# Patient Record
Sex: Female | Born: 1970 | Race: White | Hispanic: No | Marital: Married | Smoking: Former smoker
Health system: Southern US, Community
[De-identification: ages and names within clinical notes are randomized; demographics above are authoritative.]

## PROBLEM LIST (undated history)

## (undated) DIAGNOSIS — Z923 Personal history of irradiation: Secondary | ICD-10-CM

## (undated) DIAGNOSIS — Z853 Personal history of malignant neoplasm of breast: Secondary | ICD-10-CM

## (undated) DIAGNOSIS — E8881 Metabolic syndrome: Secondary | ICD-10-CM

## (undated) DIAGNOSIS — Z9884 Bariatric surgery status: Secondary | ICD-10-CM

## (undated) DIAGNOSIS — M8580 Other specified disorders of bone density and structure, unspecified site: Secondary | ICD-10-CM

## (undated) DIAGNOSIS — C50919 Malignant neoplasm of unspecified site of unspecified female breast: Secondary | ICD-10-CM

## (undated) DIAGNOSIS — E559 Vitamin D deficiency, unspecified: Secondary | ICD-10-CM

## (undated) DIAGNOSIS — S42031A Displaced fracture of lateral end of right clavicle, initial encounter for closed fracture: Secondary | ICD-10-CM

## (undated) DIAGNOSIS — E282 Polycystic ovarian syndrome: Secondary | ICD-10-CM

## (undated) DIAGNOSIS — I10 Essential (primary) hypertension: Secondary | ICD-10-CM

## (undated) DIAGNOSIS — D649 Anemia, unspecified: Secondary | ICD-10-CM

## (undated) DIAGNOSIS — E7800 Pure hypercholesterolemia, unspecified: Secondary | ICD-10-CM

## (undated) DIAGNOSIS — Z79811 Long term (current) use of aromatase inhibitors: Secondary | ICD-10-CM

## (undated) DIAGNOSIS — N393 Stress incontinence (female) (male): Secondary | ICD-10-CM

## (undated) DIAGNOSIS — E785 Hyperlipidemia, unspecified: Secondary | ICD-10-CM

## (undated) DIAGNOSIS — F341 Dysthymic disorder: Secondary | ICD-10-CM

## (undated) HISTORY — DX: Malignant neoplasm of unspecified site of unspecified female breast: C50.919

## (undated) HISTORY — DX: Essential (primary) hypertension: I10

## (undated) HISTORY — DX: Displaced fracture of lateral end of right clavicle, initial encounter for closed fracture: S42.031A

## (undated) HISTORY — PX: HERNIA REPAIR: SHX51

## (undated) HISTORY — DX: Pure hypercholesterolemia, unspecified: E78.00

## (undated) HISTORY — PX: PORTACATH PLACEMENT: SHX2246

## (undated) HISTORY — DX: Dysthymic disorder: F34.1

## (undated) HISTORY — DX: Anemia, unspecified: D64.9

## (undated) HISTORY — DX: Personal history of malignant neoplasm of breast: Z85.3

## (undated) HISTORY — PX: TOTAL ABDOMINAL HYSTERECTOMY W/ BILATERAL SALPINGOOPHORECTOMY: SHX83

## (undated) HISTORY — PX: TONSILLECTOMY: SUR1361

## (undated) HISTORY — PX: CHOLECYSTECTOMY: SHX55

---
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
## 2013-02-24 DIAGNOSIS — C801 Malignant (primary) neoplasm, unspecified: Secondary | ICD-10-CM

## 2013-02-24 HISTORY — DX: Malignant (primary) neoplasm, unspecified: C80.1

## 2013-10-14 DIAGNOSIS — C50919 Malignant neoplasm of unspecified site of unspecified female breast: Secondary | ICD-10-CM | POA: Insufficient documentation

## 2015-03-09 DIAGNOSIS — S42031A Displaced fracture of lateral end of right clavicle, initial encounter for closed fracture: Secondary | ICD-10-CM | POA: Insufficient documentation

## 2015-03-09 HISTORY — DX: Displaced fracture of lateral end of right clavicle, initial encounter for closed fracture: S42.031A

## 2015-11-02 DIAGNOSIS — C50919 Malignant neoplasm of unspecified site of unspecified female breast: Secondary | ICD-10-CM | POA: Diagnosis not present

## 2015-11-07 DIAGNOSIS — C50919 Malignant neoplasm of unspecified site of unspecified female breast: Secondary | ICD-10-CM | POA: Diagnosis not present

## 2015-12-17 DIAGNOSIS — Z0120 Encounter for dental examination and cleaning without abnormal findings: Secondary | ICD-10-CM | POA: Diagnosis not present

## 2015-12-27 DIAGNOSIS — G43909 Migraine, unspecified, not intractable, without status migrainosus: Secondary | ICD-10-CM | POA: Diagnosis not present

## 2015-12-27 DIAGNOSIS — F329 Major depressive disorder, single episode, unspecified: Secondary | ICD-10-CM | POA: Diagnosis not present

## 2015-12-27 DIAGNOSIS — F419 Anxiety disorder, unspecified: Secondary | ICD-10-CM | POA: Diagnosis not present

## 2015-12-27 DIAGNOSIS — C50912 Malignant neoplasm of unspecified site of left female breast: Secondary | ICD-10-CM | POA: Diagnosis not present

## 2015-12-27 DIAGNOSIS — E46 Unspecified protein-calorie malnutrition: Secondary | ICD-10-CM | POA: Diagnosis not present

## 2015-12-27 DIAGNOSIS — E7800 Pure hypercholesterolemia, unspecified: Secondary | ICD-10-CM | POA: Diagnosis not present

## 2015-12-27 DIAGNOSIS — K449 Diaphragmatic hernia without obstruction or gangrene: Secondary | ICD-10-CM | POA: Diagnosis not present

## 2015-12-27 DIAGNOSIS — I10 Essential (primary) hypertension: Secondary | ICD-10-CM | POA: Diagnosis not present

## 2016-01-07 DIAGNOSIS — Z23 Encounter for immunization: Secondary | ICD-10-CM | POA: Diagnosis not present

## 2016-01-14 DIAGNOSIS — F419 Anxiety disorder, unspecified: Secondary | ICD-10-CM | POA: Diagnosis not present

## 2016-01-14 DIAGNOSIS — E663 Overweight: Secondary | ICD-10-CM | POA: Diagnosis not present

## 2016-01-14 DIAGNOSIS — C50912 Malignant neoplasm of unspecified site of left female breast: Secondary | ICD-10-CM | POA: Diagnosis not present

## 2016-01-14 DIAGNOSIS — E46 Unspecified protein-calorie malnutrition: Secondary | ICD-10-CM | POA: Diagnosis not present

## 2016-01-14 DIAGNOSIS — K449 Diaphragmatic hernia without obstruction or gangrene: Secondary | ICD-10-CM | POA: Diagnosis not present

## 2016-01-14 DIAGNOSIS — Z713 Dietary counseling and surveillance: Secondary | ICD-10-CM | POA: Diagnosis not present

## 2016-01-14 DIAGNOSIS — F329 Major depressive disorder, single episode, unspecified: Secondary | ICD-10-CM | POA: Diagnosis not present

## 2016-01-14 DIAGNOSIS — G43909 Migraine, unspecified, not intractable, without status migrainosus: Secondary | ICD-10-CM | POA: Diagnosis not present

## 2016-01-14 DIAGNOSIS — I10 Essential (primary) hypertension: Secondary | ICD-10-CM | POA: Diagnosis not present

## 2016-01-14 DIAGNOSIS — E669 Obesity, unspecified: Secondary | ICD-10-CM | POA: Diagnosis not present

## 2016-01-14 DIAGNOSIS — E7800 Pure hypercholesterolemia, unspecified: Secondary | ICD-10-CM | POA: Diagnosis not present

## 2016-01-31 DIAGNOSIS — C50919 Malignant neoplasm of unspecified site of unspecified female breast: Secondary | ICD-10-CM | POA: Diagnosis not present

## 2016-02-12 DIAGNOSIS — Z853 Personal history of malignant neoplasm of breast: Secondary | ICD-10-CM | POA: Diagnosis not present

## 2016-02-27 DIAGNOSIS — E559 Vitamin D deficiency, unspecified: Secondary | ICD-10-CM | POA: Diagnosis not present

## 2016-02-27 DIAGNOSIS — E782 Mixed hyperlipidemia: Secondary | ICD-10-CM | POA: Diagnosis not present

## 2016-03-04 DIAGNOSIS — R7989 Other specified abnormal findings of blood chemistry: Secondary | ICD-10-CM | POA: Diagnosis not present

## 2016-04-30 DIAGNOSIS — C50919 Malignant neoplasm of unspecified site of unspecified female breast: Secondary | ICD-10-CM | POA: Diagnosis not present

## 2016-05-13 DIAGNOSIS — E669 Obesity, unspecified: Secondary | ICD-10-CM | POA: Diagnosis not present

## 2016-05-13 DIAGNOSIS — F329 Major depressive disorder, single episode, unspecified: Secondary | ICD-10-CM | POA: Diagnosis not present

## 2016-05-13 DIAGNOSIS — B001 Herpesviral vesicular dermatitis: Secondary | ICD-10-CM | POA: Diagnosis not present

## 2016-06-06 MED FILL — LETROZOLE 2.5 MG TABLET: 2.5 | 90 days supply | Qty: 90 | Fill #0

## 2016-06-13 DIAGNOSIS — C50919 Malignant neoplasm of unspecified site of unspecified female breast: Secondary | ICD-10-CM | POA: Diagnosis not present

## 2016-06-17 DIAGNOSIS — E669 Obesity, unspecified: Secondary | ICD-10-CM | POA: Diagnosis not present

## 2016-06-18 DIAGNOSIS — C50412 Malignant neoplasm of upper-outer quadrant of left female breast: Secondary | ICD-10-CM | POA: Diagnosis not present

## 2016-06-18 DIAGNOSIS — C50919 Malignant neoplasm of unspecified site of unspecified female breast: Secondary | ICD-10-CM | POA: Diagnosis not present

## 2016-06-27 DIAGNOSIS — Z79811 Long term (current) use of aromatase inhibitors: Secondary | ICD-10-CM | POA: Diagnosis not present

## 2016-06-27 DIAGNOSIS — C50919 Malignant neoplasm of unspecified site of unspecified female breast: Secondary | ICD-10-CM | POA: Diagnosis not present

## 2016-07-15 MED FILL — ATORVASTATIN 20 MG TABLET: 20 | 90 days supply | Qty: 90 | Fill #0

## 2016-07-15 MED FILL — AMLODIPINE BESYLATE 5 MG TA: 5 | 90 days supply | Qty: 90 | Fill #0

## 2016-07-17 MED FILL — ESCITALOPRAM 10 MG TABLET: 10 | 90 days supply | Qty: 90 | Fill #0

## 2016-07-22 DIAGNOSIS — F329 Major depressive disorder, single episode, unspecified: Secondary | ICD-10-CM | POA: Diagnosis not present

## 2016-07-22 DIAGNOSIS — E669 Obesity, unspecified: Secondary | ICD-10-CM | POA: Diagnosis not present

## 2016-07-22 DIAGNOSIS — E8881 Metabolic syndrome: Secondary | ICD-10-CM | POA: Diagnosis not present

## 2016-07-30 MED FILL — METFORMIN HCL ER 500 MG TAB: 500 | 90 days supply | Qty: 180 | Fill #0

## 2016-08-01 DIAGNOSIS — E785 Hyperlipidemia, unspecified: Secondary | ICD-10-CM | POA: Diagnosis not present

## 2016-08-01 DIAGNOSIS — I10 Essential (primary) hypertension: Secondary | ICD-10-CM | POA: Diagnosis not present

## 2016-08-08 DIAGNOSIS — C50919 Malignant neoplasm of unspecified site of unspecified female breast: Secondary | ICD-10-CM | POA: Diagnosis not present

## 2016-08-08 DIAGNOSIS — I10 Essential (primary) hypertension: Secondary | ICD-10-CM | POA: Diagnosis not present

## 2016-08-08 DIAGNOSIS — E782 Mixed hyperlipidemia: Secondary | ICD-10-CM | POA: Diagnosis not present

## 2016-09-04 DIAGNOSIS — Z853 Personal history of malignant neoplasm of breast: Secondary | ICD-10-CM | POA: Diagnosis not present

## 2016-09-12 MED FILL — LETROZOLE 2.5 MG TABLET: 2.5 | 90 days supply | Qty: 90 | Fill #1

## 2016-09-16 DIAGNOSIS — E785 Hyperlipidemia, unspecified: Secondary | ICD-10-CM | POA: Diagnosis not present

## 2016-09-16 DIAGNOSIS — F329 Major depressive disorder, single episode, unspecified: Secondary | ICD-10-CM | POA: Diagnosis not present

## 2016-09-16 DIAGNOSIS — E8881 Metabolic syndrome: Secondary | ICD-10-CM | POA: Diagnosis not present

## 2016-09-16 DIAGNOSIS — E559 Vitamin D deficiency, unspecified: Secondary | ICD-10-CM | POA: Diagnosis not present

## 2016-09-26 DIAGNOSIS — C50919 Malignant neoplasm of unspecified site of unspecified female breast: Secondary | ICD-10-CM | POA: Diagnosis not present

## 2016-11-07 DIAGNOSIS — C50412 Malignant neoplasm of upper-outer quadrant of left female breast: Secondary | ICD-10-CM | POA: Diagnosis not present

## 2016-11-07 DIAGNOSIS — C50919 Malignant neoplasm of unspecified site of unspecified female breast: Secondary | ICD-10-CM | POA: Diagnosis not present

## 2016-11-28 MED FILL — AMLODIPINE BESYLATE 5 MG TA: 5 | 90 days supply | Qty: 90 | Fill #1

## 2016-11-28 MED FILL — ESCITALOPRAM 10 MG TABLET: 10 | 90 days supply | Qty: 90 | Fill #1

## 2016-12-17 MED FILL — LETROZOLE 2.5 MG TABLET: 2.5 | 90 days supply | Qty: 90 | Fill #2

## 2016-12-17 MED FILL — ATORVASTATIN 20 MG TABLET: 20 | 90 days supply | Qty: 90 | Fill #1

## 2017-02-06 ENCOUNTER — Ambulatory Visit (INDEPENDENT_AMBULATORY_CARE_PROVIDER_SITE_OTHER): Payer: 59 | Admitting: Family Medicine

## 2017-02-06 ENCOUNTER — Encounter: Payer: Self-pay | Admitting: Family Medicine

## 2017-02-06 DIAGNOSIS — Z853 Personal history of malignant neoplasm of breast: Secondary | ICD-10-CM | POA: Diagnosis not present

## 2017-02-06 DIAGNOSIS — I10 Essential (primary) hypertension: Secondary | ICD-10-CM

## 2017-02-06 DIAGNOSIS — E559 Vitamin D deficiency, unspecified: Secondary | ICD-10-CM

## 2017-02-06 DIAGNOSIS — F5081 Binge eating disorder: Secondary | ICD-10-CM | POA: Diagnosis not present

## 2017-02-06 DIAGNOSIS — E7800 Pure hypercholesterolemia, unspecified: Secondary | ICD-10-CM

## 2017-02-06 DIAGNOSIS — R5383 Other fatigue: Secondary | ICD-10-CM

## 2017-02-06 DIAGNOSIS — Z23 Encounter for immunization: Secondary | ICD-10-CM | POA: Diagnosis not present

## 2017-02-06 DIAGNOSIS — F341 Dysthymic disorder: Secondary | ICD-10-CM | POA: Diagnosis not present

## 2017-02-06 MED ORDER — LISDEXAMFETAMINE DIMESYLATE 20 MG PO CAPS: 20.0000 mg | ORAL_CAPSULE | Freq: Every day | ORAL | 0 refills | Status: DC

## 2017-02-06 NOTE — Progress Notes (Signed)
Alexis Chase is a 46 y.o. female is here to [facility].   Patient Care Team: Briscoe Deutscher, DO as PCP - General (Family Medicine)   History of Present Illness:   HPI: See Assessment and Plan section for Problem Based Charting of issues discussed today.  Within the past 3 months... Rarely Sometimes Often Always  During the past 3 months, have you had any episodes of excessive overeating?   x   Do you feel distressed about your excessive overeating?    x  During your episodes of excessive overeating, how often did you feel like you had no control over your eating?  x    During your episodes of excessive eating, how often did you continue to eat even though you were not hungry?   x   During your episodes of excessive overeating, how often were you embarrassed by how much you ate?   x   During your episodes of excessive overeating, how often did you feel disgusted by yourself or guilty afterwards?   x   During the last 3 months, how often did you make yourself vomit as a means to control your weight or shape? NONE      There are no preventive care reminders to display for this patient.   Depression screen PHQ 2/9 02/06/2017  Decreased Interest 0  Down, Depressed, Hopeless 0  PHQ - 2 Score 0   PMHx, SurgHx, SocialHx, Medications, and Allergies were reviewed in the Visit Navigator and updated as appropriate.   Past Medical History:  Diagnosis Date  . Dysthymia 02/07/2017  . Essential hypertension 02/07/2017  . History of breast cancer 02/07/2017  . Pure hypercholesterolemia 02/07/2017   History reviewed. No pertinent surgical history. History reviewed. No pertinent family history.   Social History   Tobacco Use  . Smoking status: Never Smoker  . Smokeless tobacco: Never Used  Substance Use Topics  . Alcohol use: Not on file  . Drug use: Not on file   Current Medications and Allergies:   .  cholecalciferol (VITAMIN D) 1000 units tablet, Take 1,000 Units by mouth  daily., Disp: , Rfl:  .  amLODipine (NORVASC) 5 MG tablet, Take 1 tablet by mouth daily., Disp: , Rfl: 1 .  atorvastatin (LIPITOR) 20 MG tablet, Take 1 tablet by mouth daily., Disp: , Rfl: 1 .  escitalopram (LEXAPRO) 10 MG tablet, , Disp: , Rfl: 1 .  letrozole (FEMARA) 2.5 MG tablet, Take 1 tablet by mouth daily., Disp: , Rfl: 3  No Known Allergies   Review of Systems:   Pertinent items are noted in the HPI. Otherwise, ROS is negative.  Vitals:   Vitals:   02/06/17 1409  BP: 136/74  Pulse: 63  Temp: 98 F (36.7 C)  TempSrc: Oral  SpO2: 96%  Weight: 242 lb (109.8 kg)  Height: 5\' 7"  (1.702 m)     Body mass index is 37.9 kg/m.   Physical Exam:   Physical Exam  Constitutional: She is oriented to person, place, and time. She appears well-developed and well-nourished. No distress.  HENT:  Head: Normocephalic and atraumatic.  Right Ear: External ear normal.  Left Ear: External ear normal.  Nose: Nose normal.  Mouth/Throat: Oropharynx is clear and moist.  Eyes: Conjunctivae and EOM are normal. Pupils are equal, round, and reactive to light.  Neck: Normal range of motion. Neck supple. No thyromegaly present.  Cardiovascular: Normal rate, regular rhythm, normal heart sounds and intact distal pulses.  Pulmonary/Chest: Effort  normal and breath sounds normal.  Abdominal: Soft. Bowel sounds are normal.  Musculoskeletal: Normal range of motion.  Lymphadenopathy:    She has no cervical adenopathy.  Neurological: She is alert and oriented to person, place, and time.  Skin: Skin is warm and dry. Capillary refill takes less than 2 seconds.  Psychiatric: She has a normal mood and affect. Her behavior is normal.  Nursing note and vitals reviewed.  Results for orders placed or performed in visit on 02/06/17  VITAMIN D 25 Hydroxy (Vit-D Deficiency, Fractures)  Result Value Ref Range   Vit D, 25-Hydroxy 63 30 - 100 ng/mL  Vitamin B12  Result Value Ref Range   Vitamin B-12 456 200 -  1,100 pg/mL  TSH  Result Value Ref Range   TSH 1.56 mIU/L  Lipid panel  Result Value Ref Range   Cholesterol 163 <200 mg/dL   HDL 61 >50 mg/dL   Triglycerides 111 <150 mg/dL   LDL Cholesterol (Calc) 81 mg/dL (calc)   Total CHOL/HDL Ratio 2.7 <5.0 (calc)   Non-HDL Cholesterol (Calc) 102 <130 mg/dL (calc)  Hemoglobin A1c  Result Value Ref Range   Hgb A1c MFr Bld 5.5 <5.7 % of total Hgb   Mean Plasma Glucose 111 (calc)   eAG (mmol/L) 6.2 (calc)  Comprehensive metabolic panel  Result Value Ref Range   Glucose, Bld 87 65 - 99 mg/dL   BUN 16 7 - 25 mg/dL   Creat 0.99 0.50 - 1.10 mg/dL   BUN/Creatinine Ratio NOT APPLICABLE 6 - 22 (calc)   Sodium 141 135 - 146 mmol/L   Potassium 4.3 3.5 - 5.3 mmol/L   Chloride 103 98 - 110 mmol/L   CO2 25 20 - 32 mmol/L   Calcium 10.2 8.6 - 10.2 mg/dL   Total Protein 7.4 6.1 - 8.1 g/dL   Albumin 4.7 3.6 - 5.1 g/dL   Globulin 2.7 1.9 - 3.7 g/dL (calc)   AG Ratio 1.7 1.0 - 2.5 (calc)   Total Bilirubin 0.5 0.2 - 1.2 mg/dL   Alkaline phosphatase (APISO) 125 (H) 33 - 115 U/L   AST 26 10 - 35 U/L   ALT 30 (H) 6 - 29 U/L  CBC with Differential/Platelet  Result Value Ref Range   WBC 6.1 3.8 - 10.8 Thousand/uL   RBC 4.43 3.80 - 5.10 Million/uL   Hemoglobin 13.9 11.7 - 15.5 g/dL   HCT 40.7 35.0 - 45.0 %   MCV 91.9 80.0 - 100.0 fL   MCH 31.4 27.0 - 33.0 pg   MCHC 34.2 32.0 - 36.0 g/dL   RDW 12.3 11.0 - 15.0 %   Platelets 252 140 - 400 Thousand/uL   MPV 9.8 7.5 - 12.5 fL   Neutro Abs 3,331 1,500 - 7,800 cells/uL   Lymphs Abs 2,184 850 - 3,900 cells/uL   WBC mixed population 445 200 - 950 cells/uL   Eosinophils Absolute 98 15 - 500 cells/uL   Basophils Absolute 43 0 - 200 cells/uL   Neutrophils Relative % 54.6 %   Total Lymphocyte 35.8 %   Monocytes Relative 7.3 %   Eosinophils Relative 1.6 %   Basophils Relative 0.7 %   Assessment and Plan:   Problem  Vitamin D Deficiency  Binge Eating Disorder    Within the past 3 months... Rarely  Sometimes Often Always  During the past 3 months, have you had any episodes of excessive overeating?   x   Do you feel distressed about your excessive overeating?  x  During your episodes of excessive overeating, how often did you feel like you had no control over your eating?  x    During your episodes of excessive eating, how often did you continue to eat even though you were not hungry?   x   During your episodes of excessive overeating, how often were you embarrassed by how much you ate?   x   During your episodes of excessive overeating, how often did you feel disgusted by yourself or guilty afterwards?   x   During the last 3 months, how often did you make yourself vomit as a means to control your weight or shape? NONE         Morbid Obesity ([facility])   BMI 37.9 with comorbid conditions.  Wt Readings from Last 3 Encounters:  02/06/17 242 lb (109.8 kg)   Contraindications to weight loss: none. Patient readiness to commit to diet and activity changes: excellent. Barriers to weight loss: time limitations.  Plan: 1. Diagnostic studies to rule out secondary causes of obesity: see below. 2. General patient education:   Average sustained weight loss in long-term studies w/lifestyle interventions alone is 10-15 lb.  Importance of long-term maintenance tx in weight loss.  Use non-food self-rewards to reinforce behavior changes.  Elicit support from others; identify saboteurs.  Practical target weight is usually around 2 BMI units below current weight. 3. Diet interventions:   Risks of dieting were reviewed, including fatigue, temporary hair loss, gallstone formation, gout, and with very low calorie diets, electrolyte abnormalities, nutrient inadequacies, and loss of lean body mass. 4. Exercise intervention:   Informal measures, e.g. taking stairs instead of elevator.  Formal exercise regimen options. 5. Other behavioral treatment: stress management. 6. Other treatment:  Medication: Vyvanse. 7. Patient to keep a weight log that we will review at follow up. 8. Follow up: 3 months and as needed.     Essential Hypertension   Avoiding excessive salt intake? [x]   YES  []   NO Trying to exercise on a regular basis? []   YES  [x]   NO Review: taking medications as instructed, no medication side effects noted, no TIAs, no chest pain on exertion, no dyspnea on exertion, no swelling of ankles.   Wt Readings from Last 3 Encounters:  02/06/17 242 lb (109.8 kg)   Reports that  has never smoked.   BP Readings from Last 3 Encounters:  02/06/17 136/74   Lab Results  Component Value Date   CREATININE 0.99 02/06/2017     Hyperlipidemia   Is the patient taking medications without problems? [x]   YES  []   NO Does the patient complain of muscle aches?   []   YES  [x]    NO Trying to exercise on a regular basis? []   YES  []   NO Diet Compliance: compliant most of the time. Concerns: weight gain. Cardiovascular ROS: no chest pain or dyspnea on exertion.   Lipids:    Component Value Date/Time   CHOL 163 02/06/2017 1522   TRIG 111 02/06/2017 1522   HDL 61 02/06/2017 1522   CHOLHDL 2.7 02/06/2017 1522     Dysthymia    Treatment to date has included Medication.  Symptoms have been stable since onset of treatment.  Side effects from the treatment include: none.   Alcohol use: no.  Drug use: no.  Exercise: minimal.   Patient denies current suicidal and homicidal ideation.      Orders Placed This Encounter  Procedures  . Flu Vaccine  QUAD 36+ mos IM  . VITAMIN D 25 Hydroxy (Vit-D Deficiency, Fractures)  . Vitamin B12  . TSH  . Lipid panel  . Hemoglobin A1c  . Comprehensive metabolic panel  . CBC with Differential/Platelet    Meds ordered this encounter  Medications  . lisdexamfetamine (VYVANSE) 20 MG capsule    Sig: Take 1 capsule (20 mg total) by mouth daily.    Dispense:  30 capsule    Refill:  0    . Reviewed expectations re: course of current  medical issues. . Discussed self-management of symptoms. . Outlined signs and symptoms indicating need for more acute intervention. . Patient verbalized understanding and all questions were answered. Marland Kitchen Health Maintenance issues including appropriate healthy diet, exercise, and smoking avoidance were discussed with patient. . See orders for this visit as documented in the electronic medical record. . Patient received an After Visit Summary.  CMA served as Education administrator during this visit. History, Physical, and Plan performed by medical provider. The above documentation has been reviewed and is accurate and complete. Briscoe Deutscher, D.O.  Briscoe Deutscher, DO [place], [place] 02/08/2017  Future Appointments  Date Time Provider [place]  05/01/2017  3:00 PM Briscoe Deutscher, DO [facility]

## 2017-02-07 ENCOUNTER — Encounter: Payer: Self-pay | Admitting: Family Medicine

## 2017-02-07 DIAGNOSIS — E669 Obesity, unspecified: Secondary | ICD-10-CM | POA: Insufficient documentation

## 2017-02-07 DIAGNOSIS — E7800 Pure hypercholesterolemia, unspecified: Secondary | ICD-10-CM

## 2017-02-07 DIAGNOSIS — Z853 Personal history of malignant neoplasm of breast: Secondary | ICD-10-CM | POA: Insufficient documentation

## 2017-02-07 DIAGNOSIS — E785 Hyperlipidemia, unspecified: Secondary | ICD-10-CM | POA: Insufficient documentation

## 2017-02-07 DIAGNOSIS — F341 Dysthymic disorder: Secondary | ICD-10-CM | POA: Insufficient documentation

## 2017-02-07 DIAGNOSIS — E782 Mixed hyperlipidemia: Secondary | ICD-10-CM | POA: Insufficient documentation

## 2017-02-07 DIAGNOSIS — I10 Essential (primary) hypertension: Secondary | ICD-10-CM | POA: Insufficient documentation

## 2017-02-07 HISTORY — DX: Dysthymic disorder: F34.1

## 2017-02-07 HISTORY — DX: Personal history of malignant neoplasm of breast: Z85.3

## 2017-02-07 HISTORY — DX: Essential (primary) hypertension: I10

## 2017-02-07 HISTORY — DX: Pure hypercholesterolemia, unspecified: E78.00

## 2017-02-07 HISTORY — DX: Hyperlipidemia, unspecified: E78.5

## 2017-02-07 LAB — CBC WITH DIFFERENTIAL/PLATELET
Basophils Absolute: 43 cells/uL (ref 0–200)
Basophils Relative: 0.7 %
Eosinophils Absolute: 98 cells/uL (ref 15–500)
Eosinophils Relative: 1.6 %
HCT: 40.7 % (ref 35.0–45.0)
Hemoglobin: 13.9 g/dL (ref 11.7–15.5)
Lymphs Abs: 2184 cells/uL (ref 850–3900)
MCH: 31.4 pg (ref 27.0–33.0)
MCHC: 34.2 g/dL (ref 32.0–36.0)
MCV: 91.9 fL (ref 80.0–100.0)
MPV: 9.8 fL (ref 7.5–12.5)
Monocytes Relative: 7.3 %
Neutro Abs: 3331 cells/uL (ref 1500–7800)
Neutrophils Relative %: 54.6 %
Platelets: 252 10*3/uL (ref 140–400)
RBC: 4.43 10*6/uL (ref 3.80–5.10)
RDW: 12.3 % (ref 11.0–15.0)
Total Lymphocyte: 35.8 %
WBC mixed population: 445 cells/uL (ref 200–950)
WBC: 6.1 10*3/uL (ref 3.8–10.8)

## 2017-02-07 LAB — LIPID PANEL
Cholesterol: 163 mg/dL (ref <200)
HDL: 61 mg/dL (ref >50)
LDL Cholesterol (Calc): 81 mg/dL (calc)
Non-HDL Cholesterol (Calc): 102 mg/dL (calc) (ref <130)
Total CHOL/HDL Ratio: 2.7 (calc) (ref <5.0)
Triglycerides: 111 mg/dL (ref <150)

## 2017-02-07 LAB — COMPREHENSIVE METABOLIC PANEL
AG Ratio: 1.7 (calc) (ref 1.0–2.5)
ALT: 30 U/L — ABNORMAL HIGH (ref 6–29)
AST: 26 U/L (ref 10–35)
Albumin: 4.7 g/dL (ref 3.6–5.1)
Alkaline phosphatase (APISO): 125 U/L — ABNORMAL HIGH (ref 33–115)
BUN: 16 mg/dL (ref 7–25)
CO2: 25 mmol/L (ref 20–32)
Calcium: 10.2 mg/dL (ref 8.6–10.2)
Chloride: 103 mmol/L (ref 98–110)
Creat: 0.99 mg/dL (ref 0.50–1.10)
Globulin: 2.7 g/dL (calc) (ref 1.9–3.7)
Glucose, Bld: 87 mg/dL (ref 65–99)
Potassium: 4.3 mmol/L (ref 3.5–5.3)
Sodium: 141 mmol/L (ref 135–146)
Total Bilirubin: 0.5 mg/dL (ref 0.2–1.2)
Total Protein: 7.4 g/dL (ref 6.1–8.1)

## 2017-02-07 LAB — VITAMIN B12: Vitamin B-12: 456 pg/mL (ref 200–1100)

## 2017-02-07 LAB — VITAMIN D 25 HYDROXY (VIT D DEFICIENCY, FRACTURES): Vit D, 25-Hydroxy: 63 ng/mL (ref 30–100)

## 2017-02-07 LAB — TSH: TSH: 1.56 mIU/L

## 2017-02-07 LAB — HEMOGLOBIN A1C
Hgb A1c MFr Bld: 5.5 % of total Hgb (ref <5.7)
Mean Plasma Glucose: 111 (calc)
eAG (mmol/L): 6.2 (calc)

## 2017-02-08 DIAGNOSIS — E559 Vitamin D deficiency, unspecified: Secondary | ICD-10-CM | POA: Insufficient documentation

## 2017-02-08 DIAGNOSIS — F5081 Binge eating disorder: Secondary | ICD-10-CM | POA: Insufficient documentation

## 2017-02-08 HISTORY — DX: Vitamin D deficiency, unspecified: E55.9

## 2017-02-09 MED FILL — VYVANSE 20 MG CAPSULE: 20 | 30 days supply | Qty: 30 | Fill #0

## 2017-02-18 DIAGNOSIS — C50919 Malignant neoplasm of unspecified site of unspecified female breast: Secondary | ICD-10-CM | POA: Diagnosis not present

## 2017-02-18 DIAGNOSIS — C50412 Malignant neoplasm of upper-outer quadrant of left female breast: Secondary | ICD-10-CM | POA: Diagnosis not present

## 2017-03-15 ENCOUNTER — Encounter: Payer: Self-pay | Admitting: Family Medicine

## 2017-03-16 ENCOUNTER — Other Ambulatory Visit: Payer: Self-pay | Admitting: Surgical

## 2017-03-16 DIAGNOSIS — F341 Dysthymic disorder: Secondary | ICD-10-CM

## 2017-03-16 DIAGNOSIS — Z853 Personal history of malignant neoplasm of breast: Secondary | ICD-10-CM

## 2017-03-16 DIAGNOSIS — I10 Essential (primary) hypertension: Secondary | ICD-10-CM

## 2017-03-16 MED ORDER — LETROZOLE 2.5 MG PO TABS: 2.5000 mg | ORAL_TABLET | Freq: Every day | ORAL | 3 refills | Status: DC

## 2017-03-16 MED ORDER — AMLODIPINE BESYLATE 5 MG PO TABS: 5.0000 mg | ORAL_TABLET | Freq: Every day | ORAL | 3 refills | Status: DC

## 2017-03-16 MED ORDER — ESCITALOPRAM OXALATE 10 MG PO TABS: 10.0000 mg | ORAL_TABLET | Freq: Every day | ORAL | 3 refills | Status: DC

## 2017-03-16 MED FILL — ESCITALOPRAM 10 MG TABLET: 10 | 30 days supply | Qty: 30 | Fill #0

## 2017-03-16 MED FILL — LETROZOLE 2.5 MG TABLET: 2.5 | 30 days supply | Qty: 30 | Fill #0

## 2017-03-16 MED FILL — AMLODIPINE BESYLATE 5 MG TA: 5 | 90 days supply | Qty: 90 | Fill #0

## 2017-03-30 ENCOUNTER — Encounter: Payer: Self-pay | Admitting: Family Medicine

## 2017-04-02 ENCOUNTER — Other Ambulatory Visit: Payer: Self-pay | Admitting: Family Medicine

## 2017-04-10 MED FILL — LETROZOLE 2.5 MG TABLET: 2.5 | 30 days supply | Qty: 30 | Fill #1

## 2017-04-10 MED FILL — ESCITALOPRAM 10 MG TABLET: 10 | 30 days supply | Qty: 30 | Fill #1

## 2017-05-01 ENCOUNTER — Ambulatory Visit (INDEPENDENT_AMBULATORY_CARE_PROVIDER_SITE_OTHER): Payer: 59 | Admitting: Family Medicine

## 2017-05-01 VITALS — BP 122/84 | HR 75 | Temp 98.1°F | Ht 67.0 in | Wt 246.8 lb

## 2017-05-01 DIAGNOSIS — F341 Dysthymic disorder: Secondary | ICD-10-CM

## 2017-05-01 DIAGNOSIS — Z853 Personal history of malignant neoplasm of breast: Secondary | ICD-10-CM | POA: Diagnosis not present

## 2017-05-01 DIAGNOSIS — I10 Essential (primary) hypertension: Secondary | ICD-10-CM

## 2017-05-01 DIAGNOSIS — E7800 Pure hypercholesterolemia, unspecified: Secondary | ICD-10-CM

## 2017-05-01 DIAGNOSIS — F5081 Binge eating disorder: Secondary | ICD-10-CM

## 2017-05-01 MED ORDER — LISDEXAMFETAMINE DIMESYLATE 20 MG PO CAPS: 20.0000 mg | ORAL_CAPSULE | Freq: Every day | ORAL | 0 refills | Status: DC

## 2017-05-01 MED ORDER — METFORMIN HCL ER 500 MG PO TB24: 500.0000 mg | ORAL_TABLET | Freq: Every day | ORAL | 0 refills | Status: DC

## 2017-05-01 NOTE — Progress Notes (Signed)
Alexis Chase is a 47 y.o. female is here for follow up.  History of Present Illness:   [name] CMA acting as scribe for Dr. Juleen China.  HPI: See Assessment and Plan section for Problem Based Charting of issues discussed today.  Review of Systems  Constitutional: Negative for chills and fever.  HENT: Negative for congestion and ear pain.   Eyes: Negative for blurred vision and double vision.  Respiratory: Negative for cough and wheezing.   Cardiovascular: Negative for chest pain and palpitations.  Gastrointestinal: Negative for nausea and vomiting.  Genitourinary: Negative for dysuria.  Musculoskeletal: Negative for back pain and neck pain.  Neurological: Negative for dizziness and headaches.  Psychiatric/Behavioral: Negative for depression and suicidal ideas.   There are no preventive care reminders to display for this patient. Depression screen PHQ 2/9 02/06/2017  Decreased Interest 0  Down, Depressed, Hopeless 0  PHQ - 2 Score 0   PMHx, SurgHx, SocialHx, FamHx, Medications, and Allergies were reviewed in the Visit Navigator and updated as appropriate.   Patient Active Problem List   Diagnosis Date Noted  . Vitamin D deficiency 02/08/2017  . Binge eating disorder, on Vyvanse 02/08/2017  . Morbid obesity ([place]), interested in gastric sleeve 02/07/2017  . Essential hypertension, on Norvasc 02/07/2017  . Hyperlipidemia, on statin 02/07/2017  . Dysthymia 02/07/2017  . History of breast cancer, ER +, HER2 -, grade 3, s/p chemo and radiation. 3 year anniversery recently 02/07/2017   Social History   Tobacco Use  . Smoking status: Never Smoker  . Smokeless tobacco: Never Used  Substance Use Topics  . Alcohol use: Not on file  . Drug use: Not on file   Current Medications and Allergies:   .  amLODipine (NORVASC) 5 MG tablet, Take 1 tablet (5 mg total) by mouth daily., Disp: 30 tablet, Rfl: 3 .  atorvastatin (LIPITOR) 20 MG tablet, Take 1 tablet by mouth daily.,  Disp: , Rfl: 1 .  cholecalciferol (VITAMIN D) 1000 units tablet, Take 1,000 Units by mouth daily., Disp: , Rfl:  .  escitalopram (LEXAPRO) 10 MG tablet, Take 1 tablet (10 mg total) by mouth daily., Disp: 30 tablet, Rfl: 3 .  letrozole (FEMARA) 2.5 MG tablet, Take 1 tablet (2.5 mg total) by mouth daily., Disp: 30 tablet, Rfl: 3 .  lisdexamfetamine (VYVANSE) 20 MG capsule, Take 1 capsule (20 mg total) by mouth daily., Disp: 30 capsule, Rfl: 0  No Known Allergies   Review of Systems   Pertinent items are noted in the HPI. Otherwise, ROS is negative.  Vitals:   Vitals:   05/01/17 1505  BP: 122/84  Pulse: 75  Temp: 98.1 F (36.7 C)  TempSrc: Oral  SpO2: 97%  Weight: 246 lb 12.8 oz (111.9 kg)  Height: '5\' 7"'$  (1.702 m)     Body mass index is 38.65 kg/m.  Physical Exam:   Physical Exam  Constitutional: She is oriented to person, place, and time. She appears well-developed and well-nourished. No distress.  HENT:  Head: Normocephalic and atraumatic.  Right Ear: External ear normal.  Left Ear: External ear normal.  Nose: Nose normal.  Mouth/Throat: Oropharynx is clear and moist.  Eyes: Conjunctivae and EOM are normal. Pupils are equal, round, and reactive to light.  Neck: Normal range of motion. Neck supple. No thyromegaly present.  Cardiovascular: Normal rate, regular rhythm, normal heart sounds and intact distal pulses.  Pulmonary/Chest: Effort normal and breath sounds normal.  Abdominal: Soft. Bowel sounds are normal.  Musculoskeletal:  Normal range of motion.  Lymphadenopathy:    She has no cervical adenopathy.  Neurological: She is alert and oriented to person, place, and time.  Skin: Skin is warm and dry. Capillary refill takes less than 2 seconds.  Psychiatric: She has a normal mood and affect. Her behavior is normal.  Nursing note and vitals reviewed.   Results for orders placed or performed in visit on 02/06/17  VITAMIN D 25 Hydroxy (Vit-D Deficiency, Fractures)    Result Value Ref Range   Vit D, 25-Hydroxy 63 30 - 100 ng/mL  Vitamin B12  Result Value Ref Range   Vitamin B-12 456 200 - 1,100 pg/mL  TSH  Result Value Ref Range   TSH 1.56 mIU/L  Lipid panel  Result Value Ref Range   Cholesterol 163 <200 mg/dL   HDL 61 >50 mg/dL   Triglycerides 111 <150 mg/dL   LDL Cholesterol (Calc) 81 mg/dL (calc)   Total CHOL/HDL Ratio 2.7 <5.0 (calc)   Non-HDL Cholesterol (Calc) 102 <130 mg/dL (calc)  Hemoglobin A1c  Result Value Ref Range   Hgb A1c MFr Bld 5.5 <5.7 % of total Hgb   Mean Plasma Glucose 111 (calc)   eAG (mmol/L) 6.2 (calc)  Comprehensive metabolic panel  Result Value Ref Range   Glucose, Bld 87 65 - 99 mg/dL   BUN 16 7 - 25 mg/dL   Creat 0.99 0.50 - 1.10 mg/dL   BUN/Creatinine Ratio NOT APPLICABLE 6 - 22 (calc)   Sodium 141 135 - 146 mmol/L   Potassium 4.3 3.5 - 5.3 mmol/L   Chloride 103 98 - 110 mmol/L   CO2 25 20 - 32 mmol/L   Calcium 10.2 8.6 - 10.2 mg/dL   Total Protein 7.4 6.1 - 8.1 g/dL   Albumin 4.7 3.6 - 5.1 g/dL   Globulin 2.7 1.9 - 3.7 g/dL (calc)   AG Ratio 1.7 1.0 - 2.5 (calc)   Total Bilirubin 0.5 0.2 - 1.2 mg/dL   Alkaline phosphatase (APISO) 125 (H) 33 - 115 U/L   AST 26 10 - 35 U/L   ALT 30 (H) 6 - 29 U/L  CBC with Differential/Platelet  Result Value Ref Range   WBC 6.1 3.8 - 10.8 Thousand/uL   RBC 4.43 3.80 - 5.10 Million/uL   Hemoglobin 13.9 11.7 - 15.5 g/dL   HCT 40.7 35.0 - 45.0 %   MCV 91.9 80.0 - 100.0 fL   MCH 31.4 27.0 - 33.0 pg   MCHC 34.2 32.0 - 36.0 g/dL   RDW 12.3 11.0 - 15.0 %   Platelets 252 140 - 400 Thousand/uL   MPV 9.8 7.5 - 12.5 fL   Neutro Abs 3,331 1,500 - 7,800 cells/uL   Lymphs Abs 2,184 850 - 3,900 cells/uL   WBC mixed population 445 200 - 950 cells/uL   Eosinophils Absolute 98 15 - 500 cells/uL   Basophils Absolute 43 0 - 200 cells/uL   Neutrophils Relative % 54.6 %   Total Lymphocyte 35.8 %   Monocytes Relative 7.3 %   Eosinophils Relative 1.6 %   Basophils Relative 0.7 %    Assessment and Plan:   Patient Active Problem List   Diagnosis Date Noted  . Vitamin D deficiency 02/08/2017  . Binge eating disorder, on Vyvanse 02/08/2017  . Morbid obesity ([place]), interested in gastric sleeve 02/07/2017  . Essential hypertension, on Norvasc 02/07/2017  . Hyperlipidemia, on statin 02/07/2017  . Dysthymia 02/07/2017  . History of breast cancer, ER +, HER2 -, grade  3, s/p chemo and radiation. 3 year anniversery recently 02/07/2017   Essential hypertension, on Norvasc Cardiovascular ROS: no chest pain or dyspnea on exertion. Tolerating medication with issues. Blood pressure at goal. Working on limiting salt and trying to exercise 3 times per week.   Binge eating disorder, on Vyvanse Vyvanse does seem to help control binges. She has been out for a few weeks. Interested in counseling and will look into this when she goes through bariatric surgery work-up.   Dysthymia Psychological ROS: negative for - mood swings, obsessive thoughts or suicidal ideation. Improved with Lexapro. High stress job. Breast cancer survivor. Interested in support groups. Will look into breast center support groups.   Hyperlipidemia, on statin Tolerating Lipitor without myalgias.   History of breast cancer, ER +, HER2 -, grade 3, s/p chemo and radiation. 3 year anniversery recently Whole body scans coming up. Patient admits that she is very worried. She is interested in a breast cancer support group. I believe that the breast center has this, along with a LCSW and other programs for wellness - will see if we can refer.  Morbid obesity ([place]), interested in gastric sleeve Visit #2 for weight loss discussion. She has been exercising 3 days per week at the gym. She does struggle with healthy food choices and staying within calorie limits. Weight is up slightly. She is still interested in the gastric sleeve and has the initial information from [place]. She still needs to watch the video prior  to her initial consultation.   We reviewed the importance of lowering her carbohydrate intake to 100-120 g/day or less. She will also work harder to make her exercise class.  Recheck in 3 months.   Briscoe Deutscher

## 2017-05-02 ENCOUNTER — Encounter: Payer: Self-pay | Admitting: Family Medicine

## 2017-05-02 NOTE — Assessment & Plan Note (Signed)
Tolerating Lipitor without myalgias.

## 2017-05-02 NOTE — Assessment & Plan Note (Signed)
Psychological ROS: negative for - mood swings, obsessive thoughts or suicidal ideation. Improved with Lexapro. High stress job. Breast cancer survivor. Interested in support groups. Will look into breast center support groups.

## 2017-05-02 NOTE — Assessment & Plan Note (Signed)
Cardiovascular ROS: no chest pain or dyspnea on exertion. Tolerating medication with issues. Blood pressure at goal. Working on limiting salt and trying to exercise 3 times per week.

## 2017-05-02 NOTE — Assessment & Plan Note (Signed)
Vyvanse does seem to help control binges. She has been out for a few weeks. Interested in counseling and will look into this when she goes through bariatric surgery work-up.

## 2017-05-03 ENCOUNTER — Encounter: Payer: Self-pay | Admitting: Family Medicine

## 2017-05-03 NOTE — Assessment & Plan Note (Signed)
Visit #2 for weight loss discussion. She has been exercising 3 days per week at the gym. She does struggle with healthy food choices and staying within calorie limits. Weight is up slightly. She is still interested in the gastric sleeve and has the initial information from [place]. She still needs to watch the video prior to her initial consultation.   We reviewed the importance of lowering her carbohydrate intake to 100-120 g/day or less. She will also work harder to make her exercise class.  Recheck in 3 months.

## 2017-05-03 NOTE — Assessment & Plan Note (Signed)
Whole body scans coming up. Patient admits that she is very worried. She is interested in a breast cancer support group. I believe that the breast center has this, along with a LCSW and other programs for wellness - will see if we can refer.

## 2017-05-14 ENCOUNTER — Telehealth: Payer: Self-pay | Admitting: Family Medicine

## 2017-05-14 NOTE — Telephone Encounter (Signed)
See note. No further action required.

## 2017-05-14 NOTE — Telephone Encounter (Signed)
I attempted to call the patient and give her Trey Paula' phone number. Her voicemail box is full so I was unable to leave a message.  I just wanted to make sure it was documented so you were aware I have tried to reach her- if she calls back anyone should be able to give her the number. [phone]).

## 2017-05-14 NOTE — Telephone Encounter (Addendum)
°  Relation to pt: self  Call back [phone]  Reason for call:  Patient returned call and the following # mentioned below was relayed, patient voice understanding.

## 2017-05-15 NOTE — Telephone Encounter (Signed)
Noted  

## 2017-05-21 MED FILL — ATORVASTATIN 20 MG TABLET: 20 | 90 days supply | Qty: 90 | Fill #0

## 2017-05-21 MED FILL — LETROZOLE 2.5 MG TABLET: 2.5 | 30 days supply | Qty: 30 | Fill #2

## 2017-05-21 MED FILL — METFORMIN HCL ER 500 MG TAB: 500 | 90 days supply | Qty: 90 | Fill #0

## 2017-05-21 MED FILL — VYVANSE 20 MG CAPSULE: 20 | 30 days supply | Qty: 30 | Fill #0

## 2017-05-29 DIAGNOSIS — H5203 Hypermetropia, bilateral: Secondary | ICD-10-CM | POA: Diagnosis not present

## 2017-06-18 MED FILL — LETROZOLE 2.5 MG TABLET: 2.5 | 30 days supply | Qty: 30 | Fill #3

## 2017-06-18 MED FILL — AMLODIPINE BESYLATE 5 MG TA: 5 | 90 days supply | Qty: 90 | Fill #1

## 2017-06-18 MED FILL — ESCITALOPRAM 10 MG TABLET: 10 | 30 days supply | Qty: 30 | Fill #2

## 2017-06-26 DIAGNOSIS — C50919 Malignant neoplasm of unspecified site of unspecified female breast: Secondary | ICD-10-CM | POA: Diagnosis not present

## 2017-06-29 ENCOUNTER — Encounter: Payer: Self-pay | Admitting: Family Medicine

## 2017-06-29 ENCOUNTER — Ambulatory Visit (INDEPENDENT_AMBULATORY_CARE_PROVIDER_SITE_OTHER): Payer: 59 | Admitting: Family Medicine

## 2017-06-29 VITALS — BP 122/88 | HR 75 | Temp 97.8°F | Ht 67.0 in

## 2017-06-29 DIAGNOSIS — Z23 Encounter for immunization: Secondary | ICD-10-CM | POA: Diagnosis not present

## 2017-06-29 DIAGNOSIS — Z111 Encounter for screening for respiratory tuberculosis: Secondary | ICD-10-CM | POA: Diagnosis not present

## 2017-06-29 NOTE — Progress Notes (Signed)
Alexis Chase is a 47 y.o. female is here for follow up.  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  CHE:NIDPOEU comes in today to get titers for her work. She needs TB skin test, Tdap, and titers today.   Depression screen PHQ 2/9 02/06/2017  Decreased Interest 0  Down, Depressed, Hopeless 0  PHQ - 2 Score 0   PMHx, SurgHx, SocialHx, FamHx, Medications, and Allergies were reviewed in the Visit Navigator and updated as appropriate.   Patient Active Problem List   Diagnosis Date Noted  . Vitamin D deficiency 02/08/2017  . Binge eating disorder, on Vyvanse 02/08/2017  . Morbid obesity ([place]), interested in gastric sleeve 02/07/2017  . Essential hypertension, on Norvasc 02/07/2017  . Hyperlipidemia, on statin 02/07/2017  . Dysthymia 02/07/2017  . History of breast cancer, ER +, HER2 -, grade 3, s/p chemo and radiation. 3 year anniversery recently 02/07/2017   Social History   Tobacco Use  . Smoking status: Never Smoker  . Smokeless tobacco: Never Used  Substance Use Topics  . Alcohol use: Not on file  . Drug use: Not on file   Current Medications and Allergies:   .  amLODipine (NORVASC) 5 MG tablet, Take 1 tablet (5 mg total) by mouth daily., Disp: 30 tablet .  aspirin EC 81 MG tablet, Take by mouth., Disp: , Rfl:  .  atorvastatin (LIPITOR) 20 MG tablet, Take 1 tablet by mouth daily., Disp: , Rfl: 1 .  cholecalciferol (VITAMIN D) 1000 units tablet, Take 1,000 Units by mouth daily., Disp: , Rfl:  .  escitalopram (LEXAPRO) 10 MG tablet, Take 1 tablet (10 mg total) by mouth daily., Disp: 30 tablet .  letrozole (FEMARA) 2.5 MG tablet, Take 1 tablet (2.5 mg total) by mouth daily., Disp: 30 tablet, Rfl: 3 .  lisdexamfetamine (VYVANSE) 20 MG capsule, Take 1 capsule (20 mg total) by mouth daily. .  metFORMIN (GLUCOPHAGE XR) 500 MG 24 hr tablet, Take 1 tablet (500 mg total) by mouth daily with breakfast., Disp: 90 tablet, Rfl: 0  No Known  Allergies   Review of Systems   Pertinent items are noted in the HPI. Otherwise, ROS is negative.  Vitals:   Vitals:   06/29/17 0712  BP: 122/88  Pulse: 75  Temp: 97.8 F (36.6 C)  TempSrc: Oral  SpO2: 98%  Height: '5\' 7"'$  (1.702 m)     Body mass index is 38.65 kg/m.   Physical Exam:    Assessment and Plan:   Alexis Chase was seen today for follow-up.  Diagnoses and all orders for this visit:  Need for prophylactic vaccination against measles, mumps, rubella and varicella -     Measles/Mumps/Rubella Immunity -     Hepatitis B Surface AntiBODY -     Varicella zoster antibody, IgG  Need for prophylactic vaccination against diphtheria-tetanus-pertussis (DTP) -     Tdap vaccine greater than or equal to 7yo IM  Need for prophylactic vaccination against tuberculosis using BCG vaccine -     PPD   . Reviewed expectations re: course of current medical issues. . Discussed self-management of symptoms. . Outlined signs and symptoms indicating need for more acute intervention. . Patient verbalized understanding and all questions were answered. Marland Kitchen Health Maintenance issues including appropriate healthy diet, exercise, and smoking avoidance were discussed with patient. . See orders for this visit as documented in the electronic medical record. . Patient received an After Visit Summary.  Danae Chen  Juleen China DO [place] [place] 06/29/2017  Future Appointments  Date Time Provider [place]  07/01/2017  9:00 AM [facility] NURSE [facility]  08/07/2017  3:00 PM Briscoe Deutscher, DO [facility]

## 2017-07-01 ENCOUNTER — Ambulatory Visit: Payer: 59 | Admitting: *Deleted

## 2017-07-01 DIAGNOSIS — Z111 Encounter for screening for respiratory tuberculosis: Secondary | ICD-10-CM

## 2017-07-01 LAB — TB SKIN TEST
Induration: 0 mm
TB Skin Test: NEGATIVE

## 2017-07-01 LAB — VARICELLA ZOSTER ANTIBODY, IGG: Varicella IgG: 1647 index

## 2017-07-01 LAB — MEASLES/MUMPS/RUBELLA IMMUNITY
Mumps IgG: <9 AU/mL — ABNORMAL LOW
Rubella: 10.7 index
Rubeola IgG: <25 AU/mL — ABNORMAL LOW

## 2017-07-01 LAB — HEPATITIS B SURFACE ANTIBODY,QUALITATIVE: Hep B S Ab: NONREACTIVE

## 2017-07-01 NOTE — Progress Notes (Signed)
Patient arrived for PPD reading. Not all titers have returned. I told her that once they do we will call her if anything additional is needed. 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

## 2017-07-02 ENCOUNTER — Other Ambulatory Visit: Payer: Self-pay

## 2017-07-02 DIAGNOSIS — Z111 Encounter for screening for respiratory tuberculosis: Secondary | ICD-10-CM

## 2017-07-07 ENCOUNTER — Other Ambulatory Visit (INDEPENDENT_AMBULATORY_CARE_PROVIDER_SITE_OTHER): Payer: 59

## 2017-07-07 ENCOUNTER — Ambulatory Visit (INDEPENDENT_AMBULATORY_CARE_PROVIDER_SITE_OTHER): Payer: 59

## 2017-07-07 ENCOUNTER — Encounter: Payer: Self-pay | Admitting: Family Medicine

## 2017-07-07 DIAGNOSIS — Z23 Encounter for immunization: Secondary | ICD-10-CM

## 2017-07-07 DIAGNOSIS — Z111 Encounter for screening for respiratory tuberculosis: Secondary | ICD-10-CM | POA: Diagnosis not present

## 2017-07-09 LAB — QUANTIFERON-TB GOLD PLUS
Mitogen-NIL: >10 IU/mL
NIL: 0.06 IU/mL
QuantiFERON-TB Gold Plus: NEGATIVE
TB1-NIL: <0 IU/mL
TB2-NIL: <0 IU/mL

## 2017-07-16 DIAGNOSIS — E1169 Type 2 diabetes mellitus with other specified complication: Secondary | ICD-10-CM | POA: Diagnosis not present

## 2017-07-16 DIAGNOSIS — E8881 Metabolic syndrome: Secondary | ICD-10-CM | POA: Diagnosis not present

## 2017-07-16 DIAGNOSIS — Z853 Personal history of malignant neoplasm of breast: Secondary | ICD-10-CM | POA: Diagnosis not present

## 2017-07-16 DIAGNOSIS — N393 Stress incontinence (female) (male): Secondary | ICD-10-CM | POA: Diagnosis not present

## 2017-07-16 DIAGNOSIS — E7800 Pure hypercholesterolemia, unspecified: Secondary | ICD-10-CM | POA: Diagnosis not present

## 2017-07-21 ENCOUNTER — Other Ambulatory Visit (HOSPITAL_COMMUNITY): Payer: Self-pay | Admitting: General Surgery

## 2017-07-22 ENCOUNTER — Encounter: Payer: Self-pay | Admitting: Family Medicine

## 2017-07-22 ENCOUNTER — Telehealth: Payer: Self-pay

## 2017-07-22 NOTE — Telephone Encounter (Signed)
SENT REFERRAL TO SCHEDULING 

## 2017-07-27 ENCOUNTER — Other Ambulatory Visit: Payer: Self-pay | Admitting: Family Medicine

## 2017-07-27 ENCOUNTER — Encounter: Payer: Self-pay | Admitting: Family Medicine

## 2017-07-27 DIAGNOSIS — Z853 Personal history of malignant neoplasm of breast: Secondary | ICD-10-CM

## 2017-07-27 MED FILL — LETROZOLE 2.5 MG TABLET: 2.5 | 30 days supply | Qty: 30 | Fill #0

## 2017-07-27 NOTE — Telephone Encounter (Signed)
You gave once in the past ok to refill?

## 2017-08-06 NOTE — Progress Notes (Signed)
Alexis Chase is a 47 y.o. female is here for follow up.  History of Present Illness:   HPI: Excited about Bariatric Surgery in the fall. Meeting with Dietician and Psychologist soon. Needs referral to Cardiology for clearance. Will be having EGD. Still watching diet and exercising regularly.   The 10-year ASCVD risk score Mikey Bussing DC Brooke Bonito., et al., 2013) is: 0.8%   Values used to calculate the score:     Age: 72 years     Sex: Female     Is Non-Hispanic African American: No     Diabetic: No     Tobacco smoker: No     Systolic Blood Pressure: 545 mmHg     Is BP treated: Yes     HDL Cholesterol: 61 mg/dL     Total Cholesterol: 163 mg/dL  There are no preventive care reminders to display for this patient.   Depression screen [facility] 2/9 08/07/2017 02/06/2017  Decreased Interest 0 0  Down, Depressed, Hopeless 0 0  PHQ - 2 Score 0 0  Altered sleeping 0 -  Tired, decreased energy 0 -  Change in appetite 1 -  Feeling bad or failure about yourself  1 -  Trouble concentrating 0 -  Moving slowly or fidgety/restless 0 -  Suicidal thoughts 0 -  PHQ-9 Score 2 -   PMHx, SurgHx, SocialHx, FamHx, Medications, and Allergies were reviewed in the Visit Navigator and updated as appropriate.   Patient Active Problem List   Diagnosis Date Noted  . Vitamin D deficiency 02/08/2017  . Binge eating disorder, on Vyvanse 02/08/2017  . Morbid obesity ([place]), interested in gastric sleeve 02/07/2017  . Essential hypertension, on Norvasc 02/07/2017  . Hyperlipidemia, on statin 02/07/2017  . Dysthymia 02/07/2017  . History of breast cancer, ER +, HER2 -, grade 3, s/p chemo and radiation. 3 year anniversery recently 02/07/2017   Social History   Tobacco Use  . Smoking status: Never Smoker  . Smokeless tobacco: Never Used  Substance Use Topics  . Alcohol use: Not on file  . Drug use: Not on file   Current Medications and Allergies:   Current Outpatient Medications:  .  amLODipine (NORVASC) 5 MG  tablet, Take 1 tablet (5 mg total) by mouth daily., Disp: 30 tablet, Rfl: 3 .  aspirin EC 81 MG tablet, Take by mouth., Disp: , Rfl:  .  atorvastatin (LIPITOR) 20 MG tablet, Take 1 tablet (20 mg total) by mouth daily., Disp: 90 tablet, Rfl: 1 .  cholecalciferol (VITAMIN D) 1000 units tablet, Take 1,000 Units by mouth daily., Disp: , Rfl:  .  escitalopram (LEXAPRO) 10 MG tablet, Take 1 tablet (10 mg total) by mouth daily., Disp: 30 tablet, Rfl: 3 .  letrozole (FEMARA) 2.5 MG tablet, TAKE 1 TABLET BY MOUTH DAILY., Disp: 30 tablet, Rfl: 3 .  metFORMIN (GLUCOPHAGE XR) 500 MG 24 hr tablet, Take 1 tablet (500 mg total) by mouth daily with breakfast., Disp: 90 tablet, Rfl: 0  No Known Allergies Review of Systems   Pertinent items are noted in the HPI. Otherwise, ROS is negative.  Vitals:   Vitals:   08/07/17 1515  BP: 118/86  Pulse: 83  Temp: 98 F (36.7 C)  TempSrc: Oral  SpO2: 98%  Weight: 245 lb 3.2 oz (111.2 kg)  Height: '5\' 7"'$  (1.702 m)     Body mass index is 38.4 kg/m.  Physical Exam:   Physical Exam  Constitutional: She appears well-nourished.  HENT:  Head: Normocephalic and  atraumatic.  Eyes: Pupils are equal, round, and reactive to light. EOM are normal.  Neck: Normal range of motion. Neck supple.  Cardiovascular: Normal rate, regular rhythm, normal heart sounds and intact distal pulses.  Pulmonary/Chest: Effort normal.  Abdominal: Soft.  Skin: Skin is warm.  Psychiatric: She has a normal mood and affect. Her behavior is normal.  Nursing note and vitals reviewed.   Assessment and Plan:   Diagnoses and all orders for this visit:  Essential hypertension -     amLODipine (NORVASC) 5 MG tablet; Take 1 tablet (5 mg total) by mouth daily.  Dysthymia -     escitalopram (LEXAPRO) 10 MG tablet; Take 1 tablet (10 mg total) by mouth daily.  Pure hypercholesterolemia -     atorvastatin (LIPITOR) 20 MG tablet; Take 1 tablet (20 mg total) by mouth daily.  History of  breast cancer, ER +, HER2 -, grade 3, s/p chemo and radiation. 3 year anniversery recently -     Ambulatory referral to Cardiology  Morbid obesity [facility]), interested in gastric sleeve -     Ambulatory referral to Cardiology -     metFORMIN (GLUCOPHAGE XR) 500 MG 24 hr tablet; Take 1 tablet (500 mg total) by mouth daily with breakfast. -     TSH -     Hemoglobin A1c  Fatigue, unspecified type -     TSH -     Hemoglobin A1c    . Reviewed expectations re: course of current medical issues. . Discussed self-management of symptoms. . Outlined signs and symptoms indicating need for more acute intervention. . Patient verbalized understanding and all questions were answered. Marland Kitchen Health Maintenance issues including appropriate healthy diet, exercise, and smoking avoidance were discussed with patient. . See orders for this visit as documented in the electronic medical record. . Patient received an After Visit Summary.  Briscoe Deutscher, DO [place] [place] 08/09/2017  Future Appointments  Date Time Provider [place]  12/29/2017  1:00 PM Yaakov Guthrie, PsyD [facility] None  02/12/2018  2:00 PM Briscoe Deutscher, DO [facility]

## 2017-08-07 ENCOUNTER — Encounter: Payer: Self-pay | Admitting: Family Medicine

## 2017-08-07 ENCOUNTER — Ambulatory Visit (INDEPENDENT_AMBULATORY_CARE_PROVIDER_SITE_OTHER): Payer: 59 | Admitting: Family Medicine

## 2017-08-07 VITALS — BP 118/86 | HR 83 | Temp 98.0°F | Ht 67.0 in | Wt 245.2 lb

## 2017-08-07 DIAGNOSIS — E7800 Pure hypercholesterolemia, unspecified: Secondary | ICD-10-CM

## 2017-08-07 DIAGNOSIS — R5383 Other fatigue: Secondary | ICD-10-CM | POA: Diagnosis not present

## 2017-08-07 DIAGNOSIS — F5081 Binge eating disorder: Secondary | ICD-10-CM | POA: Diagnosis not present

## 2017-08-07 DIAGNOSIS — Z853 Personal history of malignant neoplasm of breast: Secondary | ICD-10-CM | POA: Diagnosis not present

## 2017-08-07 DIAGNOSIS — I10 Essential (primary) hypertension: Secondary | ICD-10-CM

## 2017-08-07 DIAGNOSIS — F341 Dysthymic disorder: Secondary | ICD-10-CM | POA: Diagnosis not present

## 2017-08-07 MED ORDER — ATORVASTATIN CALCIUM 20 MG PO TABS: 20.0000 mg | ORAL_TABLET | Freq: Every day | ORAL | 1 refills | Status: DC

## 2017-08-07 MED ORDER — ESCITALOPRAM OXALATE 10 MG PO TABS: 10.0000 mg | ORAL_TABLET | Freq: Every day | ORAL | 3 refills | Status: DC

## 2017-08-07 MED ORDER — METFORMIN HCL ER 500 MG PO TB24: 500.0000 mg | ORAL_TABLET | Freq: Every day | ORAL | 0 refills | Status: DC

## 2017-08-07 MED ORDER — AMLODIPINE BESYLATE 5 MG PO TABS: 5.0000 mg | ORAL_TABLET | Freq: Every day | ORAL | 3 refills | Status: DC

## 2017-08-08 LAB — HEMOGLOBIN A1C
Hgb A1c MFr Bld: 5.4 % of total Hgb (ref <5.7)
Mean Plasma Glucose: 108 (calc)
eAG (mmol/L): 6 (calc)

## 2017-08-08 LAB — TSH: TSH: 1.55 mIU/L

## 2017-08-09 ENCOUNTER — Encounter: Payer: Self-pay | Admitting: Family Medicine

## 2017-08-24 MED FILL — ESCITALOPRAM 10 MG TABLET: 10 | 30 days supply | Qty: 30 | Fill #3

## 2017-08-24 MED FILL — LETROZOLE 2.5 MG TABLET: 2.5 | 30 days supply | Qty: 30 | Fill #1

## 2017-08-24 MED FILL — ATORVASTATIN CALCIUM 20 MG: 20 | 90 days supply | Qty: 90 | Fill #1

## 2017-09-07 ENCOUNTER — Encounter: Payer: Self-pay | Admitting: Skilled Nursing Facility1

## 2017-09-07 ENCOUNTER — Encounter: Payer: 59 | Attending: General Surgery | Admitting: Skilled Nursing Facility1

## 2017-09-07 DIAGNOSIS — Z818 Family history of other mental and behavioral disorders: Secondary | ICD-10-CM | POA: Diagnosis not present

## 2017-09-07 DIAGNOSIS — E7800 Pure hypercholesterolemia, unspecified: Secondary | ICD-10-CM | POA: Diagnosis not present

## 2017-09-07 DIAGNOSIS — Z8249 Family history of ischemic heart disease and other diseases of the circulatory system: Secondary | ICD-10-CM | POA: Diagnosis not present

## 2017-09-07 DIAGNOSIS — Z79899 Other long term (current) drug therapy: Secondary | ICD-10-CM | POA: Insufficient documentation

## 2017-09-07 DIAGNOSIS — Z853 Personal history of malignant neoplasm of breast: Secondary | ICD-10-CM | POA: Insufficient documentation

## 2017-09-07 DIAGNOSIS — Z836 Family history of other diseases of the respiratory system: Secondary | ICD-10-CM | POA: Insufficient documentation

## 2017-09-07 DIAGNOSIS — Z713 Dietary counseling and surveillance: Secondary | ICD-10-CM | POA: Insufficient documentation

## 2017-09-07 DIAGNOSIS — Z6841 Body Mass Index (BMI) 40.0 and over, adult: Secondary | ICD-10-CM | POA: Insufficient documentation

## 2017-09-07 DIAGNOSIS — F329 Major depressive disorder, single episode, unspecified: Secondary | ICD-10-CM | POA: Diagnosis not present

## 2017-09-07 DIAGNOSIS — Z7984 Long term (current) use of oral hypoglycemic drugs: Secondary | ICD-10-CM | POA: Insufficient documentation

## 2017-09-07 DIAGNOSIS — Z90722 Acquired absence of ovaries, bilateral: Secondary | ICD-10-CM | POA: Insufficient documentation

## 2017-09-07 DIAGNOSIS — Z8371 Family history of colonic polyps: Secondary | ICD-10-CM | POA: Diagnosis not present

## 2017-09-07 DIAGNOSIS — Z90710 Acquired absence of both cervix and uterus: Secondary | ICD-10-CM | POA: Insufficient documentation

## 2017-09-07 DIAGNOSIS — Z87891 Personal history of nicotine dependence: Secondary | ICD-10-CM | POA: Insufficient documentation

## 2017-09-07 NOTE — Progress Notes (Signed)
Pre-Op Assessment Visit:  Pre-Operative sleeve Surgery  Medical Nutrition Therapy:  Appt start time: 4:55  End time:  5:58  Patient was seen on 09/07/2017 for Pre-Operative Nutrition Assessment. Assessment and letter of approval faxed to [facility] Surgery Bariatric Surgery Program coordinator on 09/07/2017.   Pt is in remission from breast cancer.   Pt expectation of surgery: to keep breast cancer from reoccurring   Pt expectation of Dietitian: none  Start weight at [facility]: 248.2 BMI: 41.27  24 hr Dietary Recall: First Meal: 2 cups of coffee with almond milk or protein shake or avocado toast or toast with almond butter  Snack: edameme  Second Meal: salad sans chicken with fruit  Snack: nuts Third Meal: salad with whole wheat taco bowl and beans with lettuce and cheese and salsa and chips Snack:  Beverages: unsweet tea, deit soda, water, coffee  Encouraged to engage in 150 minutes of moderate physical activity including cardiovascular and weight baring weekly  Handouts given during visit include:  . Pre-Op Goals . Bariatric Surgery Protein Shakes During the appointment today the following Pre-Op Goals were reviewed with the patient: . Maintain or lose weight as instructed by your surgeon . Make healthy food choices . Begin to limit portion sizes . Limited concentrated sugars and fried foods . Keep fat/sugar in the single digits per serving on             food labels . Practice CHEWING your food  (aim for 30 chews per bite or until applesauce consistency) . Practice not drinking 15 minutes before, during, and 30 minutes after each meal/snack . Avoid all carbonated beverages  . Avoid/limit caffeinated beverages  . Avoid all sugar-sweetened beverages . Consume 3 meals per day; eat every 3-5 hours . Make a list of non-food related activities . Aim for 64-100 ounces of FLUID daily  . Aim for at least 60-80 grams of PROTEIN daily . Look for a liquid protein source that  contain ?15 g protein and ?5 g carbohydrate  (ex: shakes, drinks, shots)  -Follow diet recommendations listed below   Energy and Macronutrient Recomendations: Calories: 1500 Carbohydrate: 170 Protein: 112 Fat: 42  Demonstrated degree of understanding via:  Teach Back  Teaching Method Utilized:  Visual Auditory Hands on  Barriers to learning/adherence to lifestyle change: none identified   Patient to call the [facility] to enroll in Pre-Op and Post-Op Nutrition Education when surgery date is scheduled.

## 2017-09-11 ENCOUNTER — Ambulatory Visit (HOSPITAL_COMMUNITY)
Admission: RE | Admit: 2017-09-11 | Discharge: 2017-09-11 | Disposition: A | Discharge status: Home / Self Care | Payer: 59 | Source: Ambulatory Visit | Attending: General Surgery | Admitting: General Surgery

## 2017-09-11 DIAGNOSIS — Z01818 Encounter for other preprocedural examination: Secondary | ICD-10-CM | POA: Diagnosis not present

## 2017-09-14 ENCOUNTER — Encounter: Payer: 59 | Admitting: Skilled Nursing Facility1

## 2017-09-14 DIAGNOSIS — Z713 Dietary counseling and surveillance: Secondary | ICD-10-CM | POA: Diagnosis not present

## 2017-09-14 DIAGNOSIS — F329 Major depressive disorder, single episode, unspecified: Secondary | ICD-10-CM | POA: Diagnosis not present

## 2017-09-14 DIAGNOSIS — Z7984 Long term (current) use of oral hypoglycemic drugs: Secondary | ICD-10-CM | POA: Diagnosis not present

## 2017-09-14 DIAGNOSIS — Z6841 Body Mass Index (BMI) 40.0 and over, adult: Secondary | ICD-10-CM | POA: Diagnosis not present

## 2017-09-14 DIAGNOSIS — Z87891 Personal history of nicotine dependence: Secondary | ICD-10-CM | POA: Diagnosis not present

## 2017-09-14 DIAGNOSIS — E7800 Pure hypercholesterolemia, unspecified: Secondary | ICD-10-CM | POA: Diagnosis not present

## 2017-09-14 DIAGNOSIS — Z90722 Acquired absence of ovaries, bilateral: Secondary | ICD-10-CM | POA: Diagnosis not present

## 2017-09-14 DIAGNOSIS — Z79899 Other long term (current) drug therapy: Secondary | ICD-10-CM | POA: Diagnosis not present

## 2017-09-15 ENCOUNTER — Ambulatory Visit (INDEPENDENT_AMBULATORY_CARE_PROVIDER_SITE_OTHER): Payer: 59 | Admitting: Psychiatry

## 2017-09-15 ENCOUNTER — Encounter: Payer: Self-pay | Admitting: Skilled Nursing Facility1

## 2017-09-15 DIAGNOSIS — F509 Eating disorder, unspecified: Secondary | ICD-10-CM | POA: Diagnosis not present

## 2017-09-15 NOTE — Progress Notes (Signed)
Pre-Operative Nutrition Class:  Appt start time: 3403   End time:  1830.  Patient was seen on 09/14/2017 for Pre-Operative Bariatric Surgery Education at the [facility].   Surgery date: Surgery type: sleeve Start weight at [facility]: 248.2 Weight today: 246.3  Samples given per MNT protocol. Patient educated on appropriate usage: Bariatric Advantage Multivitamin Lot # [id number]  Exp: 10/20  Bariatric Advantage Calcium  Lot # [id number] Exp: jan-21-2020  Premier Protein  Shake Lot # [id number] Exp: June 02, 2018   The following the learning objectives were met by the patient during this course:  Identify Pre-Op Dietary Goals and will begin 2 weeks pre-operatively  Identify appropriate sources of fluids and proteins   State protein recommendations and appropriate sources pre and post-operatively  Identify Post-Operative Dietary Goals and will follow for 2 weeks post-operatively  Identify appropriate multivitamin and calcium sources  Describe the need for physical activity post-operatively and will follow MD recommendations  State when to call healthcare provider regarding medication questions or post-operative complications  Handouts given during class include:  Pre-Op Bariatric Surgery Diet Handout  Protein Shake Handout  Post-Op Bariatric Surgery Nutrition Handout  [facility] Program Information Flyer  Support Group Information Flyer  [facility] Bariatric Supplements Price List  Follow-Up Plan: Patient will follow-up at [facility] 2 weeks post operatively for diet advancement per MD.

## 2017-09-18 ENCOUNTER — Ambulatory Visit: Payer: 59 | Admitting: Cardiology

## 2017-09-18 ENCOUNTER — Encounter: Payer: Self-pay | Admitting: Cardiology

## 2017-09-18 VITALS — BP 120/82 | HR 75 | Ht 67.0 in | Wt 247.0 lb

## 2017-09-18 DIAGNOSIS — Z01810 Encounter for preprocedural cardiovascular examination: Secondary | ICD-10-CM | POA: Diagnosis not present

## 2017-09-18 DIAGNOSIS — Z9221 Personal history of antineoplastic chemotherapy: Secondary | ICD-10-CM | POA: Diagnosis not present

## 2017-09-18 NOTE — Progress Notes (Signed)
Cardiology Office Note:    Date:  09/18/2017   ID:  Alexis Chase, DOB May 12, 1970, MRN [id number]  PCP:  Briscoe Deutscher, DO  Cardiologist:  Buford Dresser, MD PhD  Referring MD: Briscoe Deutscher, DO   Chief Complaint  Patient presents with  . Follow-up    NP.     History of Present Illness:    Alexis Chase is a 47 y.o. female with a hx of breast cancer in remission, obesity, hypertension who is seen as a new consult at the request of Dr. Willette Pa. Red[place] Chase for cardiac preoperative evaluation in anticipation of bariatric surgery. She has a past medical history of chemotherapy with adriamycin, HTN, and HLD, as well as a remote history of tobacco use, as risk factors.  Breast cancer history: in 2015, L breast cancer treated with neoadjuvant chemotherapy followed by lumpectomy and radiation, then chemotherapy. Follows up in [place], [place] with oncology.   She is doing well from a cardiac perspective. No chest pain or shortness of breath. Able to be active in her daily life without limitations. Well educated in the indications of bariatric surgery, notes that she was ready to undergo this procedure several years ago but had life events that made it not the ideal time. Has been very motivated to try and lose weight with diet and exercise, but has been unable to attain her goal with this alone. Motivated to lose weight to minimize the chance of a breast cancer recurrence.  Has never had issues with anesthesia in the past.  Past Medical History:  Diagnosis Date  . Anemia   . Breast cancer ([place]) 10/14/2013  . Closed displaced fracture of acromial end of right clavicle 03/09/2015  . Dysthymia 02/07/2017  . Essential hypertension 02/07/2017  . History of breast cancer 02/07/2017   Left Breast   . Pure hypercholesterolemia 02/07/2017    Past Surgical History:  Procedure Laterality Date  . CESAREAN SECTION    . CHOLECYSTECTOMY    . HERNIA REPAIR    . PORTACATH PLACEMENT    .  TONSILLECTOMY    . TOTAL ABDOMINAL HYSTERECTOMY W/ BILATERAL SALPINGOOPHORECTOMY      Current Medications: Current Outpatient Medications on File Prior to Visit  Medication Sig  . amLODipine (NORVASC) 5 MG tablet Take 1 tablet (5 mg total) by mouth daily.  Marland Kitchen aspirin EC 81 MG tablet Take by mouth.  Marland Kitchen atorvastatin (LIPITOR) 20 MG tablet Take 1 tablet (20 mg total) by mouth daily.  . Cholecalciferol (VITAMIN D PO) Take 5,000 Units by mouth daily.  Marland Kitchen escitalopram (LEXAPRO) 10 MG tablet Take 1 tablet (10 mg total) by mouth daily.  Marland Kitchen letrozole (FEMARA) 2.5 MG tablet TAKE 1 TABLET BY MOUTH DAILY.  . metFORMIN (GLUCOPHAGE XR) 500 MG 24 hr tablet Take 1 tablet (500 mg total) by mouth daily with breakfast.  . Multiple Vitamin (MULTIVITAMIN) tablet Take 1 tablet by mouth 2 (two) times daily.  . Probiotic Product (PROBIOTIC DAILY PO) Take 1 tablet by mouth daily.  . Turmeric (CURCUMIN 95 PO) Take 1 tablet by mouth 2 (two) times daily.  Marland Kitchen Ubiquinol (UBQH PO) Take 1 tablet by mouth 2 (two) times daily.   No current facility-administered medications on file prior to visit.      Allergies:   Patient has no known allergies.   Social History   Socioeconomic History  . Marital status: Married    Spouse name: Not on file  . Number of children: Not on file  . Years  of education: Not on file  . Highest education level: Not on file  Occupational History  . Not on file  Social Needs  . Financial resource strain: Not on file  . Food insecurity:    Worry: Not on file    Inability: Not on file  . Transportation needs:    Medical: Not on file    Non-medical: Not on file  Tobacco Use  . Smoking status: Never Smoker  . Smokeless tobacco: Never Used  Substance and Sexual Activity  . Alcohol use: Not on file  . Drug use: Not on file  . Sexual activity: Not on file  Lifestyle  . Physical activity:    Days per week: Not on file    Minutes per session: Not on file  . Stress: Not on file    Relationships  . Social connections:    Talks on phone: Not on file    Gets together: Not on file    Attends religious service: Not on file    Active member of club or organization: Not on file    Attends meetings of clubs or organizations: Not on file    Relationship status: Not on file  Other Topics Concern  . Not on file  Social History Narrative  . Not on file   husband is OB/Gyn  Family History: The patient's family history includes Alcohol abuse in her maternal grandmother; Arthritis in her maternal grandmother; COPD in her father and maternal grandmother; Diabetes in her father and paternal grandmother; Heart disease in her father and paternal grandmother; High Cholesterol in her mother; High blood pressure in her father; Hypertension in her paternal grandmother.  ROS:   Please see the history of present illness.  Additional pertinent ROS: Review of Systems  Constitutional: Negative for chills, fever and malaise/fatigue.  HENT: Negative for ear pain and hearing loss.   Eyes: Negative for blurred vision and pain.  Respiratory: Negative for sputum production and shortness of breath.   Cardiovascular: Negative for chest pain, palpitations, orthopnea, claudication, leg swelling and PND.  Gastrointestinal: Negative for blood in stool, melena, nausea and vomiting.  Genitourinary: Negative for dysuria and hematuria.  Musculoskeletal: Negative for falls and myalgias.  Skin: Negative for itching and rash.  Neurological: Negative for sensory change, focal weakness and loss of consciousness.  Endo/Heme/Allergies: Does not bruise/bleed easily.   Positive for snoring, has never had a sleep study.  EKGs/Labs/Other Studies Reviewed:    The following studies were reviewed today: Care Everywhere echo from 08/03/2014  Final Impressions: 1. There are no significant chamber enlargements noted. 2. Global left ventricular wall motion and contractility are within normal limits. 3. The  estimated ejection fraction is (+/-) 65.74% by 2D.  4. Normal left ventricular diastolic filling is observed. 5. The right ventricular systolic function is within normal limits. 6. All four cardiac valves appear normal in structure and function. 7. The inferior vena cava appearance suggests normal right ventricle filling pressures. 8. There is no pericardial effusion.  Echo limited 08/03/2015 Final Impressions: 1. This is a limited echo to reassess EF; patient has history of chemotherapy. 2. The left ventricular chamber size is normal. 3. Global left ventricular wall motion and contractility are within normal limits. The estimated ejection fraction is 60-65%. 4. There is no pericardial effusion. 5. The inferior vena cava appears normal in size; and there is a greater than 50% respiratory change in the inferior vena cava dimension. 6. There is a greater than 50% respiratory change in  the inferior vena cava dimension.  EKG:  EKG is ordered today.  The ekg ordered today demonstrates normal sinus rhythm.  Recent Labs: 02/06/2017: ALT 30; BUN 16; Creat 0.99; Hemoglobin 13.9; Platelets 252; Potassium 4.3; Sodium 141 08/07/2017: TSH 1.55  Recent Lipid Panel    Component Value Date/Time   CHOL 163 02/06/2017 1522   TRIG 111 02/06/2017 1522   HDL 61 02/06/2017 1522   CHOLHDL 2.7 02/06/2017 1522   LDLCALC 81 02/06/2017 1522    Physical Exam:    VS:  BP 120/82 (BP Location: Left Arm, Patient Position: Sitting, Cuff Size: Large)   Pulse 75   Ht 5\' 7"  (1.702 m)   Wt 247 lb (112 kg)   BMI 38.69 kg/m     Wt Readings from Last 3 Encounters:  09/18/17 247 lb (112 kg)  09/15/17 246 lb 4.8 oz (111.7 kg)  09/07/17 248 lb (112.5 kg)     GEN: Well nourished, well developed in no acute distress HEENT: Normal NECK: No JVD; No carotid bruits LYMPHATICS: No lymphadenopathy CARDIAC: regular rhythm, normal S1 and S2, no murmurs, rubs, gallops. Radial and DP pulses 2+  bilaterally. RESPIRATORY:  Clear to auscultation without rales, wheezing or rhonchi  ABDOMEN: Soft, non-tender, non-distended MUSCULOSKELETAL:  No edema; No deformity  SKIN: Warm and dry NEUROLOGIC:  Alert and oriented x 3 PSYCHIATRIC:  Normal affect   ASSESSMENT:    1. Personal history of antineoplastic chemotherapy   2. Preop cardiovascular exam    PLAN:    1. History of breast cancer, with radiation and adriamycin: Discussed mechanism of heart injury with anthracycline and radiation. Reviewed symptoms of angina, heart failure, and constriction and how each of those can be due to prior treatment. On review of her echoes, it does not appear that she has ever had a global longitudinal strain assessment. She is stable for surgery (see below), but a GLS and overall function will allow [place] to establish a baseline for her.  -echo with strain measurements.  2. Preoperative cardiovascular exam RCRI=0, so low risk for surgery. According to ACC/AHA guidelines, no additional testing is recommended. We are available perioperatively if there are additional questions.   Plan for follow up: 6 mos to monitor post surgery, then annual  Medication Adjustments/Labs and Tests Ordered: Current medicines are reviewed at length with the patient today.  Concerns regarding medicines are outlined above.  Orders Placed This Encounter  Procedures  . EKG 12-Lead  . ECHOCARDIOGRAM COMPLETE   No orders of the defined types were placed in this encounter.   Patient Instructions  Medication Instructions: Your physician recommends that you continue on your current medications as directed.    If you need a refill on your cardiac medications before your next appointment, please call your pharmacy.   Labwork: None  Procedures/Testing: Your physician has requested that you have an echocardiogram. Echocardiography is a painless test that uses sound waves to create images of your heart. It provides your doctor  with information about the size and shape of your heart and how well your heart's chambers and valves are working. This procedure takes approximately one hour. There are no restrictions for this procedure. [place] [address]   Follow-Up: Your physician wants you to follow-up in 6 months with Dr. Harrell Gave. You will receive a reminder letter in the mail two months in advance. If you don't receive a letter, please call our office at [phone] to schedule this follow-up appointment.   Special Instructions:  Thank you for choosing [facility] at [facility]!!       Signed, Buford Dresser, MD PhD 09/18/2017 5:30 PM    [place]

## 2017-09-18 NOTE — Patient Instructions (Signed)
Medication Instructions: Your physician recommends that you continue on your current medications as directed.    If you need a refill on your cardiac medications before your next appointment, please call your pharmacy.   Labwork: None  Procedures/Testing: Your physician has requested that you have an echocardiogram. Echocardiography is a painless test that uses sound waves to create images of your heart. It provides your doctor with information about the size and shape of your heart and how well your heart's chambers and valves are working. This procedure takes approximately one hour. There are no restrictions for this procedure. [place] [address]   Follow-Up: Your physician wants you to follow-up in 6 months with Dr. Harrell Gave. You will receive a reminder letter in the mail two months in advance. If you don't receive a letter, please call our office at [phone] to schedule this follow-up appointment.   Special Instructions:    Thank you for choosing [facility] at [facility]!!

## 2017-10-07 ENCOUNTER — Encounter: Payer: Self-pay | Admitting: Skilled Nursing Facility1

## 2017-10-07 ENCOUNTER — Encounter: Payer: 59 | Attending: General Surgery | Admitting: Skilled Nursing Facility1

## 2017-10-07 DIAGNOSIS — Z90722 Acquired absence of ovaries, bilateral: Secondary | ICD-10-CM | POA: Insufficient documentation

## 2017-10-07 DIAGNOSIS — Z87891 Personal history of nicotine dependence: Secondary | ICD-10-CM | POA: Insufficient documentation

## 2017-10-07 DIAGNOSIS — Z90710 Acquired absence of both cervix and uterus: Secondary | ICD-10-CM | POA: Insufficient documentation

## 2017-10-07 DIAGNOSIS — Z79899 Other long term (current) drug therapy: Secondary | ICD-10-CM | POA: Insufficient documentation

## 2017-10-07 DIAGNOSIS — E639 Nutritional deficiency, unspecified: Secondary | ICD-10-CM

## 2017-10-07 DIAGNOSIS — Z818 Family history of other mental and behavioral disorders: Secondary | ICD-10-CM | POA: Insufficient documentation

## 2017-10-07 DIAGNOSIS — E7800 Pure hypercholesterolemia, unspecified: Secondary | ICD-10-CM | POA: Insufficient documentation

## 2017-10-07 DIAGNOSIS — Z853 Personal history of malignant neoplasm of breast: Secondary | ICD-10-CM | POA: Insufficient documentation

## 2017-10-07 DIAGNOSIS — F329 Major depressive disorder, single episode, unspecified: Secondary | ICD-10-CM | POA: Insufficient documentation

## 2017-10-07 DIAGNOSIS — Z713 Dietary counseling and surveillance: Secondary | ICD-10-CM | POA: Insufficient documentation

## 2017-10-07 DIAGNOSIS — Z8249 Family history of ischemic heart disease and other diseases of the circulatory system: Secondary | ICD-10-CM | POA: Insufficient documentation

## 2017-10-07 DIAGNOSIS — Z836 Family history of other diseases of the respiratory system: Secondary | ICD-10-CM | POA: Insufficient documentation

## 2017-10-07 DIAGNOSIS — Z8371 Family history of colonic polyps: Secondary | ICD-10-CM | POA: Insufficient documentation

## 2017-10-07 DIAGNOSIS — Z6841 Body Mass Index (BMI) 40.0 and over, adult: Secondary | ICD-10-CM | POA: Insufficient documentation

## 2017-10-07 DIAGNOSIS — Z7984 Long term (current) use of oral hypoglycemic drugs: Secondary | ICD-10-CM | POA: Insufficient documentation

## 2017-10-07 NOTE — Progress Notes (Signed)
  Assessment:  Primary concerns today: cone insurance.  Pt arrives for her 1st cone insurance premium visit.   Pt had multiple specific questions about her upcoming surgery. Pt state she will talk with her surgeon about all of her extra supplements.  Pt has had a hx of breast cancer.   Pt arrives having maintained weight.   Start weight at [facility]: 248.2 Weight: 249.8 BMI: 41.27  MEDICATIONS: See List   DIETARY INTAKE:  Usual eating pattern includes 3 meals and 2 snacks per day.  Everyday foods include none stated.  Avoided foods include vegetarian.    24 hr Dietary Recall: First Meal: 2 cups of coffee with almond milk or protein shake or avocado toast or toast with almond butter  Snack: edameme  Second Meal: salad sans chicken with fruit  Snack: nuts Third Meal: salad with whole wheat taco bowl and beans with lettuce and cheese and salsa and chips Snack:  Beverages: unsweet tea, deit soda, water, coffee  Encouraged to engage in 150 minutes of moderate physical activity including cardiovascular and weight baring weekly  Usual physical activity: ADL's  Estimated energy needs: 1500 calories 170 g carbohydrates 112 g protein 42 g fat  Progress Towards Goal(s):  In progress.   Nutritional Diagnosis:  [place]-3.3 Overweight/obesity related to past poor dietary habits and physical inactivity as evidenced by patient w/ recent sleeve surgery following dietary guidelines for continued weight loss.  Intervention:  Nutrition counseling. -Work on getting a plant based protein shake you like  -Work on being active for 150 minutes a week  Teaching Method Utilized:  [employer] Hands on  Barriers to learning/adherence to lifestyle change: none identified   Demonstrated degree of understanding via:  Teach Back   Monitoring/Evaluation:  Dietary intake, exercise,and body weight prn.

## 2017-10-08 MED FILL — ESCITALOPRAM 10 MG TABLET: 10 | 30 days supply | Qty: 30 | Fill #0

## 2017-10-08 MED FILL — LETROZOLE 2.5 MG TABLET: 2.5 | 30 days supply | Qty: 30 | Fill #2

## 2017-10-08 MED FILL — AMLODIPINE BESYLATE 5 MG TA: 5 | 30 days supply | Qty: 30 | Fill #0

## 2017-10-09 ENCOUNTER — Other Ambulatory Visit: Payer: Self-pay

## 2017-10-09 ENCOUNTER — Ambulatory Visit (HOSPITAL_COMMUNITY): Payer: 59 | Attending: Cardiology

## 2017-10-09 DIAGNOSIS — I10 Essential (primary) hypertension: Secondary | ICD-10-CM | POA: Insufficient documentation

## 2017-10-09 DIAGNOSIS — E785 Hyperlipidemia, unspecified: Secondary | ICD-10-CM | POA: Insufficient documentation

## 2017-10-09 DIAGNOSIS — Z01810 Encounter for preprocedural cardiovascular examination: Secondary | ICD-10-CM | POA: Insufficient documentation

## 2017-10-09 DIAGNOSIS — Z853 Personal history of malignant neoplasm of breast: Secondary | ICD-10-CM | POA: Diagnosis not present

## 2017-10-09 DIAGNOSIS — Z9221 Personal history of antineoplastic chemotherapy: Secondary | ICD-10-CM | POA: Diagnosis not present

## 2017-10-13 ENCOUNTER — Encounter: Payer: 59 | Admitting: Skilled Nursing Facility1

## 2017-10-13 ENCOUNTER — Encounter: Payer: Self-pay | Admitting: Skilled Nursing Facility1

## 2017-10-13 DIAGNOSIS — E639 Nutritional deficiency, unspecified: Secondary | ICD-10-CM

## 2017-10-13 NOTE — Progress Notes (Signed)
  Assessment:  Primary concerns today: cone insurance.  Pt arrives for her 2nd cone insurance premium visit.   Pt had multiple specific questions about her upcoming surgery. Pt state she will talk with her surgeon about all of her extra supplements.  Pt has had a hx of breast cancer.   Pt arrives having maintained weight.   Pt state she belonged to the facebook groups and they have really scared her. Pt states she wants to leave them alone. Pt states she needs time before getting surgery.   Start weight at [facility]: 248.2 Weight: 246 BMI: 41.05  MEDICATIONS: See List   DIETARY INTAKE:  Usual eating pattern includes 3 meals and 2 snacks per day.  Everyday foods include none stated.  Avoided foods include vegetarian.    24 hr Dietary Recall: First Meal: 2 cups of coffee with almond milk or protein shake or avocado toast or toast with almond butter  Snack: edameme  Second Meal: salad sans chicken with fruit  Snack: nuts Third Meal: salad with whole wheat taco bowl and beans with lettuce and cheese and salsa and chips Snack:  Beverages: unsweet tea, deit soda, water, coffee  Encouraged to engage in 150 minutes of moderate physical activity including cardiovascular and weight baring weekly  Usual physical activity: ADL's  Estimated energy needs: 1500 calories 170 g carbohydrates 112 g protein 42 g fat  Progress Towards Goal(s):  In progress.   Nutritional Diagnosis:  [place]-3.3 Overweight/obesity related to past poor dietary habits and physical inactivity as evidenced by patient w/ recent sleeve surgery following dietary guidelines for continued weight loss.  Intervention:  Nutrition counseling. -Work on getting a plant based protein shake you like  -Work on being active for 150 minutes a week -Log your food and beverage in [employer]  Teaching Method Utilized:  Visual Auditory Hands on  Barriers to learning/adherence to lifestyle change: none identified   Demonstrated  degree of understanding via:  Teach Back   Monitoring/Evaluation:  Dietary intake, exercise,and body weight prn.

## 2017-10-19 ENCOUNTER — Encounter: Payer: Self-pay | Admitting: Skilled Nursing Facility1

## 2017-10-19 ENCOUNTER — Encounter: Payer: 59 | Admitting: Skilled Nursing Facility1

## 2017-10-19 NOTE — Progress Notes (Signed)
  Assessment:  Primary concerns today: cone insurance.  Pt arrives for her 2nd cone insurance premium visit.   Pt had multiple specific questions about her upcoming surgery. Pt state she will talk with her surgeon about all of her extra supplements.  Pt has had a hx of breast cancer.   Pt arrives having maintained weight.   Pt state she belonged to the facebook groups and they have really scared her. Pt states she wants to leave them alone. Pt states she needs time before getting surgery.   Pt states she has been very busy with work which has a lot of changes going on. Pt states she is going to see the nutritionist with her doctor. Pt states she is trying to get a pattern established.   Start weight at [facility]: 248.2 Weight: pt declined BMI:   MEDICATIONS: See List   DIETARY INTAKE:  Usual eating pattern includes 3 meals and 2 snacks per day.  Everyday foods include none stated.  Avoided foods include vegetarian.    24 hr Dietary Recall: First Meal: 2 cups of coffee with almond milk or protein shake or avocado toast or toast with almond butter  Snack: edameme  Second Meal: salad sans chicken with fruit  Snack: nuts Third Meal: salad with whole wheat taco bowl and beans with lettuce and cheese and salsa and chips Snack:  Beverages: unsweet tea, deit soda, water, coffee  Encouraged to engage in 150 minutes of moderate physical activity including cardiovascular and weight baring weekly  Usual physical activity: ADL's  Estimated energy needs: 1500 calories 170 g carbohydrates 112 g protein 42 g fat  Progress Towards Goal(s):  In progress.   Nutritional Diagnosis:  [place]-3.3 Overweight/obesity related to past poor dietary habits and physical inactivity as evidenced by patient w/ recent sleeve surgery following dietary guidelines for continued weight loss.  Intervention:  Nutrition counseling. -Work on getting a plant based protein shake you like  -Work on being active for 150  minutes a week -Log your food and beverage in [employer]  Teaching Method Utilized:  Visual Auditory Hands on  Barriers to learning/adherence to lifestyle change: none identified   Demonstrated degree of understanding via:  Teach Back   Monitoring/Evaluation:  Dietary intake, exercise,and body weight prn.

## 2017-11-03 ENCOUNTER — Ambulatory Visit (INDEPENDENT_AMBULATORY_CARE_PROVIDER_SITE_OTHER): Payer: 59 | Admitting: Psychiatry

## 2017-11-03 DIAGNOSIS — F509 Eating disorder, unspecified: Secondary | ICD-10-CM | POA: Diagnosis not present

## 2017-11-06 MED FILL — LETROZOLE 2.5 MG TABLET: 2.5 | 30 days supply | Qty: 30 | Fill #3

## 2017-11-09 DIAGNOSIS — C50919 Malignant neoplasm of unspecified site of unspecified female breast: Secondary | ICD-10-CM | POA: Diagnosis not present

## 2017-11-09 DIAGNOSIS — Z1231 Encounter for screening mammogram for malignant neoplasm of breast: Secondary | ICD-10-CM | POA: Diagnosis not present

## 2017-11-27 ENCOUNTER — Other Ambulatory Visit: Payer: Self-pay | Admitting: Family Medicine

## 2017-11-27 DIAGNOSIS — Z853 Personal history of malignant neoplasm of breast: Secondary | ICD-10-CM

## 2017-11-27 MED FILL — AMLODIPINE BESYLATE 5 MG TA: 5 | 30 days supply | Qty: 30 | Fill #1

## 2017-11-27 MED FILL — ESCITALOPRAM 10 MG TABLET: 10 | 30 days supply | Qty: 30 | Fill #1

## 2017-11-27 NOTE — Telephone Encounter (Signed)
Please advise on refill. Next appointment 01/2018

## 2017-12-03 ENCOUNTER — Ambulatory Visit (INDEPENDENT_AMBULATORY_CARE_PROVIDER_SITE_OTHER): Payer: 59

## 2017-12-03 DIAGNOSIS — Z23 Encounter for immunization: Secondary | ICD-10-CM | POA: Diagnosis not present

## 2017-12-03 NOTE — Progress Notes (Unsigned)
Patient here today for Flu Vaccine. VIS given. Administered in right arm. Tolerated well

## 2017-12-03 NOTE — Patient Instructions (Signed)
Health Maintenance Due  Topic Date Due  . INFLUENZA VACCINE  09/24/2017    Depression screen [facility] 2/9 09/07/2017 08/07/2017 02/06/2017  Decreased Interest 0 0 0  Down, Depressed, Hopeless 0 0 0  PHQ - 2 Score 0 0 0  Altered sleeping - 0 -  Tired, decreased energy - 0 -  Change in appetite - 1 -  Feeling bad or failure about yourself  - 1 -  Trouble concentrating - 0 -  Moving slowly or fidgety/restless - 0 -  Suicidal thoughts - 0 -  PHQ-9 Score - 2 -

## 2017-12-08 ENCOUNTER — Telehealth: Payer: Self-pay | Admitting: Skilled Nursing Facility1

## 2017-12-08 NOTE — Telephone Encounter (Signed)
Dietitian called pt to assess their understanding of the pre-op nutrition recommendations through the teach back method to ensure the pts knowledge readiness in preparation for surgery.     Dietitian LVM. 

## 2017-12-11 MED FILL — LETROZOLE 2.5 MG TABLET: 2.5 | 30 days supply | Qty: 30 | Fill #0

## 2017-12-29 ENCOUNTER — Ambulatory Visit: Payer: 59 | Admitting: Psychiatry

## 2018-01-12 ENCOUNTER — Telehealth: Payer: Self-pay | Admitting: *Deleted

## 2018-01-12 NOTE — Telephone Encounter (Signed)
Copied from [place] [id number]. Topic: Appointment Scheduling - Scheduling Inquiry for Clinic >> Jan 12, 2018 10:18 AM Sheran Luz wrote: Reason for CRM: Patient called to reschedule appointment from 12/20 to 11/25 if possible. Patient would like to know if she can be worked in with Dr. Juleen China on 8:40 on the 25th as she has a hectic work schedule and that time works best. Please advise.

## 2018-01-12 NOTE — Telephone Encounter (Signed)
Left detailed message on personal voicemail regarding rescheduling appointment. I have rescheduled your appt to 01/18/2018 at 8:40 AM with Dr. Juleen China please arrive by 8:20 AM. Any questions please call the office.

## 2018-01-13 ENCOUNTER — Ambulatory Visit: Payer: Self-pay | Admitting: General Surgery

## 2018-01-13 DIAGNOSIS — E1169 Type 2 diabetes mellitus with other specified complication: Secondary | ICD-10-CM | POA: Diagnosis not present

## 2018-01-13 DIAGNOSIS — F419 Anxiety disorder, unspecified: Secondary | ICD-10-CM | POA: Diagnosis not present

## 2018-01-13 DIAGNOSIS — E663 Overweight: Secondary | ICD-10-CM | POA: Diagnosis not present

## 2018-01-13 DIAGNOSIS — F329 Major depressive disorder, single episode, unspecified: Secondary | ICD-10-CM | POA: Diagnosis not present

## 2018-01-13 DIAGNOSIS — E7800 Pure hypercholesterolemia, unspecified: Secondary | ICD-10-CM | POA: Diagnosis not present

## 2018-01-13 DIAGNOSIS — C50912 Malignant neoplasm of unspecified site of left female breast: Secondary | ICD-10-CM | POA: Diagnosis not present

## 2018-01-13 DIAGNOSIS — G43909 Migraine, unspecified, not intractable, without status migrainosus: Secondary | ICD-10-CM | POA: Diagnosis not present

## 2018-01-13 DIAGNOSIS — I10 Essential (primary) hypertension: Secondary | ICD-10-CM | POA: Diagnosis not present

## 2018-01-13 DIAGNOSIS — E669 Obesity, unspecified: Secondary | ICD-10-CM | POA: Diagnosis not present

## 2018-01-13 DIAGNOSIS — Z713 Dietary counseling and surveillance: Secondary | ICD-10-CM | POA: Diagnosis not present

## 2018-01-13 DIAGNOSIS — Z853 Personal history of malignant neoplasm of breast: Secondary | ICD-10-CM | POA: Diagnosis not present

## 2018-01-13 DIAGNOSIS — E8881 Metabolic syndrome: Secondary | ICD-10-CM | POA: Diagnosis not present

## 2018-01-13 DIAGNOSIS — E46 Unspecified protein-calorie malnutrition: Secondary | ICD-10-CM | POA: Diagnosis not present

## 2018-01-13 NOTE — H&P (View-Only) (Signed)
Alexis Chase Documented: 01/13/2018 9:49 AM Location: [facility] Patient #: [id number] DOB: 01/25/1971 Married / Language: English / Race: White Female  History of Present Illness (Eric M. Wilson MD; 01/13/2018 11:13 AM) The patient is a 47 year old female who presents for a bariatric surgery evaluation. She comes in today for a preoperative appointment. I initially met her in May. She denies any medical changes since I initially saw her. She denies any trips the emergency room hospital. She did end up seeing cardiology for evaluation. She was found to be low risk. She did have an echocardiogram to evaluate for any long-term damage from chemotherapy and there is no evidence of heart strain. She denies any chest pain, chest pressure, source of breath, dyspnea on exertion. She denies any heartburn or indigestion. She had an upper GI which was unremarkable. Chest x-ray was unremarkable. She does have some questions about long-term care. She also has some questions about benefits of bariatric surgery in relation to decrease cancer recurrence  07/17/2017 She is referred by Dr Erika Wallace for evaluation of weight loss surgery. She is accompanied by her husband who is a local OB/GYN. She completed our online seminar. She is interested in sleeve gastrectomy. They moved to the area a few years ago from [place]. She states that she had actually started the weight loss surgery process in [place] and had been approved for surgery but her father passed away and she had to put her plans on hold for a while. She is interested in losing weight to improve her overall health, decrease her recurrence risk for cancer.   Despite numerous attempts for sustained weight loss she has been unsuccessful. She has tried Weight Watchers on several occasions, behavior modification, prescription weight loss medication such as Belviq and phentermine, regular physical activity several  times a week-all without any long-term success  Her comorbidities include hypertension, dyslipidemia, history of breast cancer, pre-diabetes and metabolic syndrome, stress urinary incontinence   She has a history of breast cancer which was treated with neoadjuvant chemotherapy followed by lumpectomy and radiation followed by chemotherapy. She is 4 years out. She still goes to [place] for oncology follow-up. She had surveillance imaging done in early May including an MRI of her breasts, whole-body scan, and CT chest and pelvis with no evidence of disease recurrence. She did receive Adriamycin as one of her chemotherapy agents and is due for echocardiogram  She denies any chest pain, chest pressure, source of breath, orthopnea, paroxysmal nocturnal dyspnea, chest tightness, dyspnea on exertion, peripheral edema, prior blood clots. She denies any heartburn or reflux or indigestion. She is a daily bowel movement. She has had a laparoscopic cholecystectomy. She denies any melena or hematochezia. She has also had a laparoscopic hysterectomy and oophorectomy. She denies any dysuria or hematuria. She has 12-year-old twins. She denies any overt joint pain. She states that she is prediabetic and on metformin. She denies any TIAs or amaurosis fugax or migraines. She denies any tobacco or drug use. She may have a glass of wine per week. She is a pharmaceutical rep  She had a CBC, comprehensive metabolic panel in May 2019 which were normal.   Problem List/Past Medical (Eric M. 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 Redmond Pulling, MD; 01/13/2018 2:19 PM) Breast Biopsy Left. Breast Mass; Local  Excision Left. Cesarean Section - 1 Gallbladder Surgery - Laparoscopic Hysterectomy (due to cancer) - Complete Hysterectomy (not due to cancer) - Complete Oral Surgery Shoulder Surgery Right. Tonsillectomy  Diagnostic Studies History Randall Hiss M. Redmond Pulling, MD; 01/13/2018 2:19 PM) Colonoscopy never Mammogram within last year Pap Smear 1-5 years ago  Allergies Sabino Gasser, [place]; 01/13/2018 9:50 AM) No Known Drug Allergies [07/16/2017]: Allergies Reconciled  Medication History Sabino Gasser, CMA; 01/13/2018 9:50 AM) Escitalopram Oxalate (10MG Tablet, Oral) Active. AmLODIPine Besylate (5MG Tablet, Oral) Active. Atorvastatin Calcium (20MG Tablet, Oral) Active. Letrozole (2.5MG Tablet, Oral) Active. Vitamin D-3 (5000UNIT Tablet, Oral) Active. Essential One Daily Multivit (Oral) Active. Dimacid (520-400MG Tablet, Oral) Active. Probiotic (Oral) Active. ProEPA (120-180MG Capsule, Oral) Active. Medications Reconciled  Social History Randall Hiss M. Redmond Pulling, MD; 01/13/2018 2:19 PM) Alcohol use Occasional alcohol use. Caffeine use Carbonated beverages, Tea. No drug use Tobacco use Former smoker.  Family History Randall Hiss M. Redmond Pulling, MD; 01/13/2018 2:19 PM) Colon Polyps Father. Depression Father. Heart Disease Father. Respiratory Condition Father.  Pregnancy / Birth History Randall Hiss M. Redmond Pulling, MD; 01/13/2018 2:19 PM) Age at menarche 21 years. Age of menopause <44 Contraceptive History Oral contraceptives. Gravida 1 Irregular periods Length (months) of breastfeeding 3-6 Maternal age 30-35 Para 1  Other Problems Randall Hiss M. Redmond Pulling, MD; 01/13/2018 2:19 PM) Cholelithiasis Depression Lump In Breast Migraine Headache Oophorectomy Bilateral. Umbilical Hernia Repair HISTORY OF BREAST CANCER (Z85.3) followed by [facility] in [facility]. pan scan and Breast MRI 06/2017 - [facility] HYPERCHOLESTEROLEMIA (E78.00)     Review of Systems Randall Hiss M. Marlita Keil MD;  01/13/2018 11:14 AM) General Not Present- Appetite Loss, Chills, Fatigue, Fever, Night Sweats, Weight Gain and Weight Loss. Skin Not Present- Change in Wart/Mole, Dryness, Hives, Jaundice, New Lesions, Non-Healing Wounds, Rash and Ulcer. Respiratory Not Present- Bloody sputum, Chronic Cough, Difficulty Breathing, Snoring and Wheezing. Breast Not Present- Breast Mass, Breast Pain, Nipple Discharge and Skin Changes. Cardiovascular Not Present- Chest Pain, Difficulty Breathing Lying Down, Leg Cramps, Palpitations, Rapid Heart Rate, Shortness of Breath and Swelling of Extremities. Gastrointestinal Not Present- Abdominal Pain, Bloating, Bloody Stool, Change in Bowel Habits, Chronic diarrhea, Constipation, Difficulty Swallowing, Excessive gas, Gets full quickly at meals, Hemorrhoids, Indigestion, Nausea, Rectal Pain and Vomiting. Female Genitourinary Not Present- Frequency, Nocturia, Painful Urination, Pelvic Pain and Urgency. Musculoskeletal Not Present- Back Pain, Joint Pain, Joint Stiffness, Muscle Pain, Muscle Weakness and Swelling of Extremities. Neurological Not Present- Decreased Memory, Fainting, Headaches, Numbness, Seizures, Tingling, Tremor, Trouble walking and Weakness. Psychiatric Not Present- Anxiety, Bipolar, Change in Sleep Pattern, Depression, Fearful and Frequent crying. Endocrine Present- Hot flashes. Not Present- Cold Intolerance, Excessive Hunger, Hair Changes, Heat Intolerance and New Diabetes. Hematology Not Present- Blood Thinners, Easy Bruising, Excessive bleeding, Gland problems, HIV and Persistent Infections.  Vitals Sabino Gasser CMA; 01/13/2018 9:51 AM) 01/13/2018 9:50 AM Weight: 251.25 lb Height: 66in Body Surface Area: 2.2 m Body Mass Index: 40.55 kg/m  Temp.: 97.67F(Oral)  Pulse: 105 (Regular)  BP: 134/80 (Sitting, Left Arm, Standard)      Physical Exam Randall Hiss M. Jeylin Woodmansee MD; 01/13/2018 11:14 AM)  General Mental Status-Alert. General  Appearance-Consistent with stated age. Hydration-Well hydrated. Voice-Normal. Note: severe obesity; evenly distributed  Head and Neck Head-normocephalic, atraumatic with no lesions or palpable masses. Trachea-midline. Thyroid Gland Characteristics - normal size and consistency.  Eye Eyeball - Bilateral-Extraocular movements intact. Sclera/Conjunctiva - Bilateral-No scleral icterus.  ENMT Ears -Note:normal ext ears.  Mouth and Throat -Note:lips  intact.   Chest and Lung Exam Chest and lung exam reveals -quiet, even and easy respiratory effort with no use of accessory muscles and on auscultation, normal breath sounds, no adventitious sounds and normal vocal resonance. Inspection Chest Wall - Normal. Back - normal.  Breast - Did not examine.  Cardiovascular Cardiovascular examination reveals -normal heart sounds, regular rate and rhythm with no murmurs and normal pedal pulses bilaterally.  Abdomen Inspection Inspection of the abdomen reveals - No Hernias. Skin - Scar - Note: well healed trocar scars. Palpation/Percussion Palpation and Percussion of the abdomen reveal - Soft, Non Tender, No Rebound tenderness, No Rigidity (guarding) and No hepatosplenomegaly. Auscultation Auscultation of the abdomen reveals - Bowel sounds normal.  Peripheral Vascular Upper Extremity Palpation - Pulses bilaterally normal. Lower Extremity Palpation - Pulses bilaterally normal.  Neurologic Neurologic evaluation reveals -alert and oriented x 3 with no impairment of recent or remote memory. Mental Status-Normal.  Neuropsychiatric The patient's mood and affect are described as -normal. Judgment and Insight-insight is appropriate concerning matters relevant to self.  Musculoskeletal Normal Exam - Left-Upper Extremity Strength Normal and Lower Extremity Strength Normal. Normal Exam - Right-Upper Extremity Strength Normal and Lower Extremity Strength  Normal. Note: some bilateral knee crepitus  Lymphatic Head & Neck  General Head & Neck Lymphatics: Bilateral - Description - Normal. Axillary - Did not examine. Femoral & Inguinal - Did not examine.    Assessment & Plan (Eric M. Wilson MD; 01/13/2018 2:19 PM)  MORBID OBESITY (E66.01) Story: I think the patient is a good candidate for weight loss surgery either a sleeve gastrectomy Impression: The patient meets weight loss surgery criteria. I think the patient would be an acceptable candidate for Laparoscopic vertical sleeve gastrectomy. we reviewed her workup.  We rediscussed LSG. We rediscussed the postoperative process. We discussed the typical issues that we routinely see after sleeve gastrectomy. We discussed long-term results. She was given her MBSAQIP risk calculation handout- we discussed. We also discussed the relationship between obesity and cancer recurrence. We discussed the typical recovery process. All of her questions were asked and answered.  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 pan scan and Breast MRI 06/2017 - [facility]   HYPERCHOLESTEROLEMIA (E78.00)   HYPERTENSION, BENIGN (I10)  Eric M. Wilson, MD, FACS General, Bariatric, & Minimally Invasive Surgery [facility]   

## 2018-01-13 NOTE — H&P (Signed)
Alexis Chase Documented: 01/13/2018 9:49 AM Location: [facility] Patient #: [id number] DOB: 05/15/1970 Married / Language: English / Race: White Female  History of Present Illness (Kurstyn Larios M. Alaycia Eardley MD; 01/13/2018 11:13 AM) The patient is a 47 year old female who presents for a bariatric surgery evaluation. She comes in today for a preoperative appointment. I initially met her in May. She denies any medical changes since I initially saw her. She denies any trips the emergency room hospital. She did end up seeing cardiology for evaluation. She was found to be low risk. She did have an echocardiogram to evaluate for any long-term damage from chemotherapy and there is no evidence of heart strain. She denies any chest pain, chest pressure, source of breath, dyspnea on exertion. She denies any heartburn or indigestion. She had an upper GI which was unremarkable. Chest x-ray was unremarkable. She does have some questions about long-term care. She also has some questions about benefits of bariatric surgery in relation to decrease cancer recurrence  07/17/2017 She is referred by Dr Erika Wallace for evaluation of weight loss surgery. She is accompanied by her husband who is a local OB/GYN. She completed our online seminar. She is interested in sleeve gastrectomy. They moved to the area a few years ago from [place]. She states that she had actually started the weight loss surgery process in [place] and had been approved for surgery but her father passed away and she had to put her plans on hold for a while. She is interested in losing weight to improve her overall health, decrease her recurrence risk for cancer.   Despite numerous attempts for sustained weight loss she has been unsuccessful. She has tried Weight Watchers on several occasions, behavior modification, prescription weight loss medication such as Belviq and phentermine, regular physical activity several  times a week-all without any long-term success  Her comorbidities include hypertension, dyslipidemia, history of breast cancer, pre-diabetes and metabolic syndrome, stress urinary incontinence   She has a history of breast cancer which was treated with neoadjuvant chemotherapy followed by lumpectomy and radiation followed by chemotherapy. She is 4 years out. She still goes to [place]  for oncology follow-up. She had surveillance imaging done in early May including an MRI of her breasts, whole-body scan, and CT chest and pelvis with no evidence of disease recurrence. She did receive Adriamycin as one of her chemotherapy agents and is due for echocardiogram  She denies any chest pain, chest pressure, source of breath, orthopnea, paroxysmal nocturnal dyspnea, chest tightness, dyspnea on exertion, peripheral edema, prior blood clots. She denies any heartburn or reflux or indigestion. She is a daily bowel movement. She has had a laparoscopic cholecystectomy. She denies any melena or hematochezia. She has also had a laparoscopic hysterectomy and oophorectomy. She denies any dysuria or hematuria. She has 12-year-old twins. She denies any overt joint pain. She states that she is prediabetic and on metformin. She denies any TIAs or amaurosis fugax or migraines. She denies any tobacco or drug use. She may have a glass of wine per week. She is a pharmaceutical rep  She had a CBC, comprehensive metabolic panel in May 2019 which were normal.   Problem List/Past Medical (Jaking Thayer M. 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 Redmond Pulling, MD; 01/13/2018 2:19 PM) Breast Biopsy Left. Breast Mass; Local  Excision Left. Cesarean Section - 1 Gallbladder Surgery - Laparoscopic Hysterectomy (due to cancer) - Complete Hysterectomy (not due to cancer) - Complete Oral Surgery Shoulder Surgery Right. Tonsillectomy  Diagnostic Studies History Randall Hiss M. Redmond Pulling, MD; 01/13/2018 2:19 PM) Colonoscopy never Mammogram within last year Pap Smear 1-5 years ago  Allergies Sabino Gasser, [place]; 01/13/2018 9:50 AM) No Known Drug Allergies [07/16/2017]: Allergies Reconciled  Medication History Sabino Gasser, CMA; 01/13/2018 9:50 AM) Escitalopram Oxalate (10MG Tablet, Oral) Active. AmLODIPine Besylate (5MG Tablet, Oral) Active. Atorvastatin Calcium (20MG Tablet, Oral) Active. Letrozole (2.5MG Tablet, Oral) Active. Vitamin D-3 (5000UNIT Tablet, Oral) Active. Essential One Daily Multivit (Oral) Active. Dimacid (520-400MG Tablet, Oral) Active. Probiotic (Oral) Active. ProEPA (120-180MG Capsule, Oral) Active. Medications Reconciled  Social History Randall Hiss M. Redmond Pulling, MD; 01/13/2018 2:19 PM) Alcohol use Occasional alcohol use. Caffeine use Carbonated beverages, Tea. No drug use Tobacco use Former smoker.  Family History Randall Hiss M. Redmond Pulling, MD; 01/13/2018 2:19 PM) Colon Polyps Father. Depression Father. Heart Disease Father. Respiratory Condition Father.  Pregnancy / Birth History Randall Hiss M. Redmond Pulling, MD; 01/13/2018 2:19 PM) Age at menarche 48 years. Age of menopause <44 Contraceptive History Oral contraceptives. Gravida 1 Irregular periods Length (months) of breastfeeding 3-6 Maternal age 49-35 Para 1  Other Problems Randall Hiss M. Redmond Pulling, MD; 01/13/2018 2:19 PM) Cholelithiasis Depression Lump In Breast Migraine Headache Oophorectomy Bilateral. Umbilical Hernia Repair HISTORY OF BREAST CANCER (Z85.3) followed by [facility] in [facility]. pan scan and Breast MRI 06/2017 - NED HYPERCHOLESTEROLEMIA (E78.00)     Review of Systems Randall Hiss M. Kierstin January MD;  01/13/2018 11:14 AM) General Not Present- Appetite Loss, Chills, Fatigue, Fever, Night Sweats, Weight Gain and Weight Loss. Skin Not Present- Change in Wart/Mole, Dryness, Hives, Jaundice, New Lesions, Non-Healing Wounds, Rash and Ulcer. Respiratory Not Present- Bloody sputum, Chronic Cough, Difficulty Breathing, Snoring and Wheezing. Breast Not Present- Breast Mass, Breast Pain, Nipple Discharge and Skin Changes. Cardiovascular Not Present- Chest Pain, Difficulty Breathing Lying Down, Leg Cramps, Palpitations, Rapid Heart Rate, Shortness of Breath and Swelling of Extremities. Gastrointestinal Not Present- Abdominal Pain, Bloating, Bloody Stool, Change in Bowel Habits, Chronic diarrhea, Constipation, Difficulty Swallowing, Excessive gas, Gets full quickly at meals, Hemorrhoids, Indigestion, Nausea, Rectal Pain and Vomiting. Female Genitourinary Not Present- Frequency, Nocturia, Painful Urination, Pelvic Pain and Urgency. Musculoskeletal Not Present- Back Pain, Joint Pain, Joint Stiffness, Muscle Pain, Muscle Weakness and Swelling of Extremities. Neurological Not Present- Decreased Memory, Fainting, Headaches, Numbness, Seizures, Tingling, Tremor, Trouble walking and Weakness. Psychiatric Not Present- Anxiety, Bipolar, Change in Sleep Pattern, Depression, Fearful and Frequent crying. Endocrine Present- Hot flashes. Not Present- Cold Intolerance, Excessive Hunger, Hair Changes, Heat Intolerance and New Diabetes. Hematology Not Present- Blood Thinners, Easy Bruising, Excessive bleeding, Gland problems, HIV and Persistent Infections.  Vitals Sabino Gasser CMA; 01/13/2018 9:51 AM) 01/13/2018 9:50 AM Weight: 251.25 lb Height: 66in Body Surface Area: 2.2 m Body Mass Index: 40.55 kg/m  Temp.: 97.9F(Oral)  Pulse: 105 (Regular)  BP: 134/80 (Sitting, Left Arm, Standard)      Physical Exam Randall Hiss M. Aarnav Steagall MD; 01/13/2018 11:14 AM)  General Mental Status-Alert. General  Appearance-Consistent with stated age. Hydration-Well hydrated. Voice-Normal. Note: severe obesity; evenly distributed  Head and Neck Head-normocephalic, atraumatic with no lesions or palpable masses. Trachea-midline. Thyroid Gland Characteristics - normal size and consistency.  Eye Eyeball - Bilateral-Extraocular movements intact. Sclera/Conjunctiva - Bilateral-No scleral icterus.  ENMT Ears -Note:normal ext ears.  Mouth and Throat -Note:lips  intact.   Chest and Lung Exam Chest and lung exam reveals -quiet, even and easy respiratory effort with no use of accessory muscles and on auscultation, normal breath sounds, no adventitious sounds and normal vocal resonance. Inspection Chest Wall - Normal. Back - normal.  Breast - Did not examine.  Cardiovascular Cardiovascular examination reveals -normal heart sounds, regular rate and rhythm with no murmurs and normal pedal pulses bilaterally.  Abdomen Inspection Inspection of the abdomen reveals - No Hernias. Skin - Scar - Note: well healed trocar scars. Palpation/Percussion Palpation and Percussion of the abdomen reveal - Soft, Non Tender, No Rebound tenderness, No Rigidity (guarding) and No hepatosplenomegaly. Auscultation Auscultation of the abdomen reveals - Bowel sounds normal.  Peripheral Vascular Upper Extremity Palpation - Pulses bilaterally normal. Lower Extremity Palpation - Pulses bilaterally normal.  Neurologic Neurologic evaluation reveals -alert and oriented x 3 with no impairment of recent or remote memory. Mental Status-Normal.  Neuropsychiatric The patient's mood and affect are described as -normal. Judgment and Insight-insight is appropriate concerning matters relevant to self.  Musculoskeletal Normal Exam - Left-Upper Extremity Strength Normal and Lower Extremity Strength Normal. Normal Exam - Right-Upper Extremity Strength Normal and Lower Extremity Strength  Normal. Note: some bilateral knee crepitus  Lymphatic Head & Neck  General Head & Neck Lymphatics: Bilateral - Description - Normal. Axillary - Did not examine. Femoral & Inguinal - Did not examine.    Assessment & Plan (Illyanna Petillo M. Nicandro Perrault MD; 01/13/2018 2:19 PM)  MORBID OBESITY (E66.01) Story: I think the patient is a good candidate for weight loss surgery either a sleeve gastrectomy Impression: The patient meets weight loss surgery criteria. I think the patient would be an acceptable candidate for Laparoscopic vertical sleeve gastrectomy. we reviewed her workup.  We rediscussed LSG. We rediscussed the postoperative process. We discussed the typical issues that we routinely see after sleeve gastrectomy. We discussed long-term results. She was given her MBSAQIP risk calculation handout- we discussed. We also discussed the relationship between obesity and cancer recurrence. We discussed the typical recovery process. All of her questions were asked and answered.  Current Plans Pt Education - EMW_preopbariatric  DIABETES MELLITUS TYPE 2 IN OBESE (E11.69)   METABOLIC SYNDROME (E88.81)   HISTORY OF BREAST CANCER (Z85.3) Story: followed by [facility] in [place]. pan scan and Breast MRI 06/2017 - N[facility]   HYPERCHOLESTEROLEMIA (E78.00)   HYPERTENSION, BENIGN (I10)  Tahmir Kleckner M. Lorali Khamis, MD, FACS General, Bariatric, & Minimally Invasive Surgery [facility]   

## 2018-01-14 MED FILL — LETROZOLE 2.5 MG TABLET: 2.5 | 30 days supply | Qty: 30 | Fill #1

## 2018-01-18 ENCOUNTER — Ambulatory Visit: Payer: 59 | Admitting: Family Medicine

## 2018-01-19 MED FILL — ESCITALOPRAM 10 MG TABLET: 10 | 30 days supply | Qty: 30 | Fill #2

## 2018-01-19 MED FILL — AMLODIPINE BESYLATE 5 MG TA: 5 | 60 days supply | Qty: 60 | Fill #2

## 2018-01-19 MED FILL — ATORVASTATIN CALCIUM 20 MG: 20 | 90 days supply | Qty: 90 | Fill #0

## 2018-01-19 NOTE — Progress Notes (Signed)
[facility] cardiology Dr Harrell Gave 09-18-17 epic   ekg 09-18-17 epic   cxr 09-11-17 epic   Echo 10-09-17 epic

## 2018-01-19 NOTE — Patient Instructions (Signed)
Alexis Chase  01/19/2018   Your procedure is scheduled on: 01-25-18   Report to [facility] Main  Entrance    Report to admitting at 8:20AM    Call this number if you have problems the morning of surgery [phone]     Remember: NO SOLID FOOD AFTER 600 PM THE NIGHT BEFORE YOUR SURGERY. YOU MAY DRINK CLEAR FLUIDS. MORNING OF SURGERY DRINK:  1SHAKE BEFORE YOU LEAVE HOME, DRINK ALL OF THE SHAKE AT ONE TIME. THE SHAKE YOU DRINK BEFORE YOU LEAVE HOME WILL BE THE LAST FLUIDS YOU DRINK BEFORE SURGERY. BRUSH YOUR TEETH MORNING OF SURGERY AND RINSE YOUR MOUTH OUT, NO CHEWING GUM CANDY OR MINTS.      CLEAR LIQUID DIET   Foods Allowed                                                                     Foods Excluded  Coffee and tea, regular and decaf                             liquids that you cannot  Plain Jell-O in any flavor                                             see through such as: Fruit ices (not with fruit pulp)                                     milk, soups, orange juice  Iced Popsicles                                    All solid food Carbonated beverages, regular and diet                                    Cranberry, grape and apple juices Sports drinks like Gatorade Lightly seasoned clear broth or consume(fat free) Sugar, honey syrup  Sample Menu Breakfast                                Lunch                                     Supper Cranberry juice                    Beef broth                            Chicken broth Jell-O  Grape juice                           Apple juice Coffee or tea                        Jell-O                                      Popsicle                                                Coffee or tea                        Coffee or tea  _____________________________________________________________________    PAIN IS EXPECTED AFTER SURGERY AND WILL NOT BE COMPLETELY ELIMINATED.  AMBULATION AND TYLENOL WILL HELP REDUCE INCISIONAL AND GAS PAIN. MOVEMENT IS KEY!  YOU ARE EXPECTED TO BE OUT OF BED WITHIN 4 HOURS OF ADMISSION TO YOUR PATIENT ROOM.  SITTING IN THE RECLINER THROUGHOUT THE DAY IS IMPORTANT FOR DRINKING FLUIDS AND MOVING GAS THROUGHOUT THE GI TRACT.  COMPRESSION STOCKINGS SHOULD BE WORN [place] UNLESS YOU ARE WALKING.   INCENTIVE SPIROMETER SHOULD BE USED EVERY HOUR WHILE AWAKE TO DECREASE POST-OPERATIVE COMPLICATIONS SUCH AS PNEUMONIA.  WHEN DISCHARGED HOME, IT IS IMPORTANT TO CONTINUE TO WALK EVERY HOUR AND USE THE INCENTIVE SPIROMETER EVERY HOUR.             Take these medicines the morning of surgery with A SIP OF WATER: amlodipine, atorvastatin, lexapro, letrozole                                 You may not have any metal on your body including hair pins and              piercings  Do not wear jewelry, make-up, lotions, powders or perfumes, deodorant             Do not wear nail polish.  Do not shave  48 hours prior to surgery.               Do not bring valuables to the hospital. [place].  Contacts, dentures or bridgework may not be worn into surgery.  Leave suitcase in the car. After surgery it may be brought to your room.                  Please read over the following fact sheets you were given: _____________________________________________________________________             [facility] - Preparing for Surgery Before surgery, you can play an important role.  Because skin is not sterile, your skin needs to be as free of germs as possible.  You can reduce the number of germs on your skin by washing with CHG (chlorahexidine gluconate) soap before surgery.  CHG is an antiseptic cleaner which kills germs and bonds with the skin to continue killing germs even after washing. Please DO NOT use  if you have an allergy to CHG or antibacterial soaps.  If your skin becomes  reddened/irritated stop using the CHG and inform your nurse when you arrive at Short Stay. Do not shave (including legs and underarms) for at least 48 hours prior to the first CHG shower.  You may shave your face/neck. Please follow these instructions carefully:  1.  Shower with CHG Soap the night before surgery and the  morning of Surgery.  2.  If you choose to wash your hair, wash your hair first as usual with your  normal  shampoo.  3.  After you shampoo, rinse your hair and body thoroughly to remove the  shampoo.                           4.  Use CHG as you would any other liquid soap.  You can apply chg directly  to the skin and wash                       Gently with a scrungie or clean washcloth.  5.  Apply the CHG Soap to your body ONLY FROM THE NECK DOWN.   Do not use on face/ open                           Wound or open sores. Avoid contact with eyes, ears mouth and genitals (private parts).                       Wash face,  Genitals (private parts) with your normal soap.             6.  Wash thoroughly, paying special attention to the area where your surgery  will be performed.  7.  Thoroughly rinse your body with warm water from the neck down.  8.  DO NOT shower/wash with your normal soap after using and rinsing off  the CHG Soap.                9.  Pat yourself dry with a clean towel.            10.  Wear clean pajamas.            11.  Place clean sheets on your bed the night of your first shower and do not  sleep with pets. Day of Surgery : Do not apply any lotions/deodorants the morning of surgery.  Please wear clean clothes to the hospital/surgery center.  FAILURE TO FOLLOW THESE INSTRUCTIONS MAY RESULT IN THE CANCELLATION OF YOUR SURGERY PATIENT SIGNATURE_________________________________  NURSE SIGNATURE__________________________________  ________________________________________________________________________   Adam Phenix  An incentive spirometer is a tool that  can help keep your lungs clear and active. This tool measures how well you are filling your lungs with each breath. Taking long deep breaths may help reverse or decrease the chance of developing breathing (pulmonary) problems (especially infection) following:  A long period of time when you are unable to move or be active. BEFORE THE PROCEDURE   If the spirometer includes an indicator to show your best effort, your nurse or respiratory therapist will set it to a desired goal.  If possible, sit up straight or lean slightly forward. Try not to slouch.  Hold the incentive spirometer in an upright position. INSTRUCTIONS FOR USE  1. Sit on the edge of your bed if possible, or  sit up as far as you can in bed or on a chair. 2. Hold the incentive spirometer in an upright position. 3. Breathe out normally. 4. Place the mouthpiece in your mouth and seal your lips tightly around it. 5. Breathe in slowly and as deeply as possible, raising the piston or the ball toward the top of the column. 6. Hold your breath for 3-5 seconds or for as long as possible. Allow the piston or ball to fall to the bottom of the column. 7. Remove the mouthpiece from your mouth and breathe out normally. 8. Rest for a few seconds and repeat Steps 1 through 7 at least 10 times every 1-2 hours when you are awake. Take your time and take a few normal breaths between deep breaths. 9. The spirometer may include an indicator to show your best effort. Use the indicator as a goal to work toward during each repetition. 10. After each set of 10 deep breaths, practice coughing to be sure your lungs are clear. If you have an incision (the cut made at the time of surgery), support your incision when coughing by placing a pillow or rolled up towels firmly against it. Once you are able to get out of bed, walk around indoors and cough well. You may stop using the incentive spirometer when instructed by your caregiver.  RISKS AND  COMPLICATIONS  Take your time so you do not get dizzy or light-headed.  If you are in pain, you may need to take or ask for pain medication before doing incentive spirometry. It is harder to take a deep breath if you are having pain. AFTER USE  Rest and breathe slowly and easily.  It can be helpful to keep track of a log of your progress. Your caregiver can provide you with a simple table to help with this. If you are using the spirometer at home, follow these instructions: [place] IF:   You are having difficultly using the spirometer.  You have trouble using the spirometer as often as instructed.  Your pain medication is not giving enough relief while using the spirometer.  You develop fever of 100.5 F (38.1 C) or higher. SEEK IMMEDIATE MEDICAL CARE IF:   You cough up bloody sputum that had not been present before.  You develop fever of 102 F (38.9 C) or greater.  You develop worsening pain at or near the incision site. MAKE SURE YOU:   Understand these instructions.  Will watch your condition.  Will get help right away if you are not doing well or get worse. Document Released: 06/23/2006 Document Revised: 05/05/2011 Document Reviewed: 08/24/2006 [facility] Patient Information 2014 [place], [place].   ________________________________________________________________________

## 2018-01-20 ENCOUNTER — Encounter (HOSPITAL_COMMUNITY): Payer: Self-pay

## 2018-01-20 ENCOUNTER — Encounter (HOSPITAL_COMMUNITY)
Admission: RE | Admit: 2018-01-20 | Discharge: 2018-01-20 | Disposition: A | Discharge status: Home / Self Care | Payer: 59 | Source: Ambulatory Visit | Attending: General Surgery | Admitting: General Surgery

## 2018-01-20 ENCOUNTER — Other Ambulatory Visit: Payer: Self-pay

## 2018-01-20 DIAGNOSIS — Z01818 Encounter for other preprocedural examination: Secondary | ICD-10-CM | POA: Diagnosis not present

## 2018-01-20 HISTORY — DX: Polycystic ovarian syndrome: E28.2

## 2018-01-20 HISTORY — DX: Personal history of irradiation: Z92.3

## 2018-01-20 LAB — COMPREHENSIVE METABOLIC PANEL
ALT: 24 U/L (ref 0–44)
ANION GAP: 7 (ref 5–15)
AST: 25 U/L (ref 15–41)
Albumin: 4.2 g/dL (ref 3.5–5.0)
Alkaline Phosphatase: 96 U/L (ref 38–126)
BUN: 22 mg/dL — ABNORMAL HIGH (ref 6–20)
CHLORIDE: 107 mmol/L (ref 98–111)
CO2: 26 mmol/L (ref 22–32)
CREATININE: 0.85 mg/dL (ref 0.44–1.00)
Calcium: 9.5 mg/dL (ref 8.9–10.3)
Glucose, Bld: 88 mg/dL (ref 70–99)
POTASSIUM: 4.2 mmol/L (ref 3.5–5.1)
Sodium: 140 mmol/L (ref 135–145)
Total Bilirubin: 0.6 mg/dL (ref 0.3–1.2)
Total Protein: 7.3 g/dL (ref 6.5–8.1)

## 2018-01-20 LAB — CBC WITH DIFFERENTIAL/PLATELET
Abs Immature Granulocytes: 0.01 10*3/uL (ref 0.00–0.07)
BASOS PCT: 1 %
Basophils Absolute: 0 10*3/uL (ref 0.0–0.1)
EOS PCT: 1 %
Eosinophils Absolute: 0.1 10*3/uL (ref 0.0–0.5)
HCT: 43.6 % (ref 36.0–46.0)
Hemoglobin: 14.3 g/dL (ref 12.0–15.0)
Immature Granulocytes: 0 %
Lymphocytes Relative: 37 %
Lymphs Abs: 2.2 10*3/uL (ref 0.7–4.0)
MCH: 31.7 pg (ref 26.0–34.0)
MCHC: 32.8 g/dL (ref 30.0–36.0)
MCV: 96.7 fL (ref 80.0–100.0)
MONO ABS: 0.5 10*3/uL (ref 0.1–1.0)
MONOS PCT: 9 %
Neutro Abs: 3.1 10*3/uL (ref 1.7–7.7)
Neutrophils Relative %: 52 %
PLATELETS: 233 10*3/uL (ref 150–400)
RBC: 4.51 MIL/uL (ref 3.87–5.11)
RDW: 12.6 % (ref 11.5–15.5)
WBC: 5.9 10*3/uL (ref 4.0–10.5)
nRBC: 0 % (ref 0.0–0.2)

## 2018-01-20 LAB — ABO/RH: ABO/RH(D): O POS

## 2018-01-24 MED ORDER — BUPIVACAINE LIPOSOME 1.3 % IJ SUSP
20.0000 mL | INTRAMUSCULAR | Status: DC
  Filled 2018-01-24: qty 20

## 2018-01-25 ENCOUNTER — Other Ambulatory Visit: Payer: Self-pay

## 2018-01-25 ENCOUNTER — Inpatient Hospital Stay (HOSPITAL_COMMUNITY): Payer: 59 | Admitting: Anesthesiology

## 2018-01-25 ENCOUNTER — Inpatient Hospital Stay (HOSPITAL_COMMUNITY)
Admission: RE | Admit: 2018-01-25 | Discharge: 2018-01-27 | DRG: 621 | Disposition: A | Discharge status: Home / Self Care | Payer: 59 | Source: Ambulatory Visit | Attending: General Surgery | Admitting: General Surgery

## 2018-01-25 ENCOUNTER — Encounter (HOSPITAL_COMMUNITY): Payer: Self-pay | Admitting: Anesthesiology

## 2018-01-25 ENCOUNTER — Encounter (HOSPITAL_COMMUNITY)
Admission: RE | Disposition: A | Discharge status: Home / Self Care | Payer: Self-pay | Source: Ambulatory Visit | Attending: General Surgery

## 2018-01-25 DIAGNOSIS — Z87891 Personal history of nicotine dependence: Secondary | ICD-10-CM | POA: Diagnosis not present

## 2018-01-25 DIAGNOSIS — E8881 Metabolic syndrome: Secondary | ICD-10-CM | POA: Diagnosis present

## 2018-01-25 DIAGNOSIS — E785 Hyperlipidemia, unspecified: Secondary | ICD-10-CM | POA: Diagnosis present

## 2018-01-25 DIAGNOSIS — Z853 Personal history of malignant neoplasm of breast: Secondary | ICD-10-CM | POA: Diagnosis not present

## 2018-01-25 DIAGNOSIS — E7800 Pure hypercholesterolemia, unspecified: Secondary | ICD-10-CM | POA: Diagnosis present

## 2018-01-25 DIAGNOSIS — Z923 Personal history of irradiation: Secondary | ICD-10-CM

## 2018-01-25 DIAGNOSIS — Z9221 Personal history of antineoplastic chemotherapy: Secondary | ICD-10-CM

## 2018-01-25 DIAGNOSIS — I10 Essential (primary) hypertension: Secondary | ICD-10-CM | POA: Diagnosis present

## 2018-01-25 DIAGNOSIS — E119 Type 2 diabetes mellitus without complications: Secondary | ICD-10-CM | POA: Diagnosis present

## 2018-01-25 DIAGNOSIS — K449 Diaphragmatic hernia without obstruction or gangrene: Secondary | ICD-10-CM | POA: Diagnosis present

## 2018-01-25 DIAGNOSIS — Z79899 Other long term (current) drug therapy: Secondary | ICD-10-CM | POA: Diagnosis not present

## 2018-01-25 DIAGNOSIS — N393 Stress incontinence (female) (male): Secondary | ICD-10-CM | POA: Diagnosis present

## 2018-01-25 DIAGNOSIS — R7303 Prediabetes: Secondary | ICD-10-CM | POA: Diagnosis present

## 2018-01-25 DIAGNOSIS — Z6839 Body mass index (BMI) 39.0-39.9, adult: Secondary | ICD-10-CM

## 2018-01-25 DIAGNOSIS — F329 Major depressive disorder, single episode, unspecified: Secondary | ICD-10-CM | POA: Diagnosis present

## 2018-01-25 DIAGNOSIS — E782 Mixed hyperlipidemia: Secondary | ICD-10-CM | POA: Diagnosis present

## 2018-01-25 DIAGNOSIS — Z9884 Bariatric surgery status: Secondary | ICD-10-CM

## 2018-01-25 DIAGNOSIS — E669 Obesity, unspecified: Secondary | ICD-10-CM | POA: Diagnosis present

## 2018-01-25 HISTORY — DX: Metabolic syndrome: E88.810

## 2018-01-25 HISTORY — PX: LAPAROSCOPIC GASTRIC SLEEVE RESECTION WITH HIATAL HERNIA REPAIR: SHX6512

## 2018-01-25 HISTORY — DX: Stress incontinence (female) (male): N39.3

## 2018-01-25 HISTORY — DX: Metabolic syndrome: E88.81

## 2018-01-25 LAB — GLUCOSE, CAPILLARY
GLUCOSE-CAPILLARY: 128 mg/dL — AB (ref 70–99)
Glucose-Capillary: 114 mg/dL — ABNORMAL HIGH (ref 70–99)

## 2018-01-25 LAB — TYPE AND SCREEN
ABO/RH(D): O POS
ANTIBODY SCREEN: NEGATIVE

## 2018-01-25 LAB — HEMOGLOBIN AND HEMATOCRIT, BLOOD
HCT: 42.1 % (ref 36.0–46.0)
Hemoglobin: 13.7 g/dL (ref 12.0–15.0)

## 2018-01-25 SURGERY — GASTRECTOMY, SLEEVE, LAPAROSCOPIC, WITH HIATAL HERNIA REPAIR
Anesthesia: General

## 2018-01-25 MED ORDER — FENTANYL CITRATE (PF) 100 MCG/2ML IJ SOLN
INTRAMUSCULAR | Status: AC
  Administered 2018-01-25: 25 ug via INTRAVENOUS
  Filled 2018-01-25: qty 2

## 2018-01-25 MED ORDER — SCOPOLAMINE 1 MG/3DAYS TD PT72
1.0000 | MEDICATED_PATCH | TRANSDERMAL | Status: DC
  Administered 2018-01-25: 1.5 mg via TRANSDERMAL
  Filled 2018-01-25: qty 1

## 2018-01-25 MED ORDER — PROMETHAZINE HCL 25 MG/ML IJ SOLN: 6.2500 mg | INTRAMUSCULAR | Status: DC | PRN

## 2018-01-25 MED ORDER — LACTATED RINGERS IR SOLN
Status: DC | PRN
  Administered 2018-01-25: 1000 mL

## 2018-01-25 MED ORDER — LIDOCAINE 2% (20 MG/ML) 5 ML SYRINGE
INTRAMUSCULAR | Status: AC
  Filled 2018-01-25: qty 5

## 2018-01-25 MED ORDER — DIPHENHYDRAMINE HCL 50 MG/ML IJ SOLN: 12.5000 mg | Freq: Three times a day (TID) | INTRAMUSCULAR | Status: DC | PRN

## 2018-01-25 MED ORDER — MIDAZOLAM HCL 2 MG/2ML IJ SOLN
INTRAMUSCULAR | Status: AC
  Filled 2018-01-25: qty 2

## 2018-01-25 MED ORDER — BUPIVACAINE LIPOSOME 1.3 % IJ SUSP
INTRAMUSCULAR | Status: DC | PRN
  Administered 2018-01-25: 20 mL

## 2018-01-25 MED ORDER — CHLORHEXIDINE GLUCONATE 4 % EX LIQD: 60.0000 mL | Freq: Once | CUTANEOUS | Status: DC

## 2018-01-25 MED ORDER — ONDANSETRON HCL 4 MG/2ML IJ SOLN
4.0000 mg | Freq: Four times a day (QID) | INTRAMUSCULAR | Status: DC | PRN
  Administered 2018-01-25 – 2018-01-26 (×2): 4 mg via INTRAVENOUS
  Filled 2018-01-25 (×3): qty 2

## 2018-01-25 MED ORDER — HYDROMORPHONE HCL 1 MG/ML IJ SOLN
0.2500 mg | INTRAMUSCULAR | Status: DC | PRN
  Administered 2018-01-25 (×2): 0.5 mg via INTRAVENOUS

## 2018-01-25 MED ORDER — SUGAMMADEX SODIUM 500 MG/5ML IV SOLN
INTRAVENOUS | Status: AC
  Filled 2018-01-25: qty 5

## 2018-01-25 MED ORDER — FENTANYL CITRATE (PF) 100 MCG/2ML IJ SOLN
INTRAMUSCULAR | Status: AC
  Filled 2018-01-25: qty 2

## 2018-01-25 MED ORDER — FENTANYL CITRATE (PF) 100 MCG/2ML IJ SOLN
INTRAMUSCULAR | Status: DC | PRN
  Administered 2018-01-25: 100 ug via INTRAVENOUS
  Administered 2018-01-25 (×2): 50 ug via INTRAVENOUS

## 2018-01-25 MED ORDER — DEXAMETHASONE SODIUM PHOSPHATE 4 MG/ML IJ SOLN: 4.0000 mg | INTRAMUSCULAR | Status: DC

## 2018-01-25 MED ORDER — ACETAMINOPHEN 160 MG/5ML PO SOLN
650.0000 mg | Freq: Four times a day (QID) | ORAL | Status: DC
  Administered 2018-01-25 – 2018-01-27 (×4): 650 mg via ORAL
  Filled 2018-01-25 (×6): qty 20.3

## 2018-01-25 MED ORDER — HEPARIN SODIUM (PORCINE) 5000 UNIT/ML IJ SOLN
5000.0000 [IU] | INTRAMUSCULAR | Status: AC
  Administered 2018-01-25: 5000 [IU] via SUBCUTANEOUS
  Filled 2018-01-25: qty 1

## 2018-01-25 MED ORDER — LIDOCAINE 20MG/ML (2%) 15 ML SYRINGE OPTIME
INTRAMUSCULAR | Status: DC | PRN
  Administered 2018-01-25: 1 mg/kg/h via INTRAVENOUS

## 2018-01-25 MED ORDER — PROPOFOL 10 MG/ML IV BOLUS
INTRAVENOUS | Status: AC
  Filled 2018-01-25: qty 20

## 2018-01-25 MED ORDER — STERILE WATER FOR IRRIGATION IR SOLN
Status: DC | PRN
  Administered 2018-01-25: 1000 mL

## 2018-01-25 MED ORDER — FENTANYL CITRATE (PF) 100 MCG/2ML IJ SOLN
25.0000 ug | INTRAMUSCULAR | Status: DC | PRN
  Administered 2018-01-25: 50 ug via INTRAVENOUS
  Administered 2018-01-25 (×4): 25 ug via INTRAVENOUS

## 2018-01-25 MED ORDER — OXYCODONE HCL 5 MG PO TABS: 5.0000 mg | ORAL_TABLET | Freq: Once | ORAL | Status: DC | PRN

## 2018-01-25 MED ORDER — PHENYLEPHRINE 40 MCG/ML (10ML) SYRINGE FOR IV PUSH (FOR BLOOD PRESSURE SUPPORT)
PREFILLED_SYRINGE | INTRAVENOUS | Status: AC
  Filled 2018-01-25: qty 10

## 2018-01-25 MED ORDER — 0.9 % SODIUM CHLORIDE (POUR BTL) OPTIME
TOPICAL | Status: DC | PRN
  Administered 2018-01-25: 1000 mL

## 2018-01-25 MED ORDER — ACETAMINOPHEN 500 MG PO TABS
1000.0000 mg | ORAL_TABLET | ORAL | Status: AC
  Administered 2018-01-25: 1000 mg via ORAL
  Filled 2018-01-25: qty 2

## 2018-01-25 MED ORDER — DEXAMETHASONE SODIUM PHOSPHATE 10 MG/ML IJ SOLN
INTRAMUSCULAR | Status: DC | PRN
  Administered 2018-01-25: 10 mg via INTRAVENOUS

## 2018-01-25 MED ORDER — PREMIER PROTEIN SHAKE
2.0000 [oz_av] | ORAL | Status: DC
  Administered 2018-01-26 (×4): 2 [oz_av] via ORAL
  Administered 2018-01-26: 1 [oz_av] via ORAL
  Administered 2018-01-27 (×2): 2 [oz_av] via ORAL

## 2018-01-25 MED ORDER — SODIUM CHLORIDE 0.9 % IV SOLN
2.0000 g | INTRAVENOUS | Status: AC
  Administered 2018-01-25: 2 g via INTRAVENOUS
  Filled 2018-01-25: qty 2

## 2018-01-25 MED ORDER — EPHEDRINE SULFATE-NACL 50-0.9 MG/10ML-% IV SOSY
PREFILLED_SYRINGE | INTRAVENOUS | Status: DC | PRN
  Administered 2018-01-25: 5 mg via INTRAVENOUS

## 2018-01-25 MED ORDER — PHENYLEPHRINE 40 MCG/ML (10ML) SYRINGE FOR IV PUSH (FOR BLOOD PRESSURE SUPPORT)
PREFILLED_SYRINGE | INTRAVENOUS | Status: DC | PRN
  Administered 2018-01-25 (×3): 80 ug via INTRAVENOUS
  Administered 2018-01-25: 40 ug via INTRAVENOUS
  Administered 2018-01-25: 80 ug via INTRAVENOUS
  Administered 2018-01-25: 40 ug via INTRAVENOUS

## 2018-01-25 MED ORDER — MIDAZOLAM HCL 5 MG/5ML IJ SOLN
INTRAMUSCULAR | Status: DC | PRN
  Administered 2018-01-25: 2 mg via INTRAVENOUS

## 2018-01-25 MED ORDER — INSULIN ASPART 100 UNIT/ML ~~LOC~~ SOLN
0.0000 [IU] | SUBCUTANEOUS | Status: DC
  Administered 2018-01-25 – 2018-01-26 (×4): 2 [IU] via SUBCUTANEOUS

## 2018-01-25 MED ORDER — HYDROMORPHONE HCL 1 MG/ML IJ SOLN
INTRAMUSCULAR | Status: AC
  Filled 2018-01-25: qty 1

## 2018-01-25 MED ORDER — PROPOFOL 10 MG/ML IV BOLUS
INTRAVENOUS | Status: DC | PRN
  Administered 2018-01-25: 200 mg via INTRAVENOUS

## 2018-01-25 MED ORDER — ONDANSETRON HCL 4 MG/2ML IJ SOLN
INTRAMUSCULAR | Status: DC | PRN
  Administered 2018-01-25: 4 mg via INTRAVENOUS

## 2018-01-25 MED ORDER — OXYCODONE HCL 5 MG/5ML PO SOLN
5.0000 mg | Freq: Once | ORAL | Status: DC | PRN
  Filled 2018-01-25: qty 5

## 2018-01-25 MED ORDER — SODIUM CHLORIDE (PF) 0.9 % IJ SOLN
INTRAMUSCULAR | Status: AC
  Filled 2018-01-25: qty 50

## 2018-01-25 MED ORDER — OXYCODONE HCL 5 MG/5ML PO SOLN
5.0000 mg | ORAL | Status: DC | PRN
  Administered 2018-01-26 (×2): 5 mg via ORAL
  Filled 2018-01-25 (×2): qty 5

## 2018-01-25 MED ORDER — KETOROLAC TROMETHAMINE 30 MG/ML IJ SOLN: 30.0000 mg | Freq: Once | INTRAMUSCULAR | Status: DC | PRN

## 2018-01-25 MED ORDER — PANTOPRAZOLE SODIUM 40 MG IV SOLR
40.0000 mg | Freq: Every day | INTRAVENOUS | Status: DC
  Administered 2018-01-25 – 2018-01-26 (×2): 40 mg via INTRAVENOUS
  Filled 2018-01-25 (×2): qty 40

## 2018-01-25 MED ORDER — KETOROLAC TROMETHAMINE 30 MG/ML IJ SOLN: 30.0000 mg | Freq: Three times a day (TID) | INTRAMUSCULAR | Status: DC | PRN

## 2018-01-25 MED ORDER — ENOXAPARIN SODIUM 30 MG/0.3ML ~~LOC~~ SOLN
30.0000 mg | Freq: Two times a day (BID) | SUBCUTANEOUS | Status: DC
  Administered 2018-01-25 – 2018-01-26 (×3): 30 mg via SUBCUTANEOUS
  Filled 2018-01-25 (×3): qty 0.3

## 2018-01-25 MED ORDER — EPHEDRINE 5 MG/ML INJ
INTRAVENOUS | Status: AC
  Filled 2018-01-25: qty 10

## 2018-01-25 MED ORDER — GABAPENTIN 100 MG PO CAPS
200.0000 mg | ORAL_CAPSULE | Freq: Two times a day (BID) | ORAL | Status: DC
  Administered 2018-01-25 – 2018-01-26 (×3): 200 mg via ORAL
  Filled 2018-01-25 (×3): qty 2

## 2018-01-25 MED ORDER — SUGAMMADEX SODIUM 500 MG/5ML IV SOLN
INTRAVENOUS | Status: DC | PRN
  Administered 2018-01-25: 300 mg via INTRAVENOUS

## 2018-01-25 MED ORDER — POTASSIUM CHLORIDE IN NACL 20-0.45 MEQ/L-% IV SOLN
INTRAVENOUS | Status: DC
  Administered 2018-01-25 – 2018-01-27 (×5): via INTRAVENOUS
  Filled 2018-01-25 (×7): qty 1000

## 2018-01-25 MED ORDER — SIMETHICONE 80 MG PO CHEW
80.0000 mg | CHEWABLE_TABLET | Freq: Four times a day (QID) | ORAL | Status: DC | PRN
  Administered 2018-01-25: 80 mg via ORAL
  Filled 2018-01-25: qty 1

## 2018-01-25 MED ORDER — APREPITANT 40 MG PO CAPS
40.0000 mg | ORAL_CAPSULE | ORAL | Status: DC
  Filled 2018-01-25: qty 1

## 2018-01-25 MED ORDER — LACTATED RINGERS IV SOLN
INTRAVENOUS | Status: DC
  Administered 2018-01-25 (×2): via INTRAVENOUS

## 2018-01-25 MED ORDER — MORPHINE SULFATE (PF) 4 MG/ML IV SOLN
1.0000 mg | INTRAVENOUS | Status: DC | PRN
  Administered 2018-01-25: 2 mg via INTRAVENOUS
  Filled 2018-01-25: qty 1

## 2018-01-25 MED ORDER — GABAPENTIN 300 MG PO CAPS
300.0000 mg | ORAL_CAPSULE | ORAL | Status: AC
  Administered 2018-01-25: 300 mg via ORAL
  Filled 2018-01-25: qty 1

## 2018-01-25 MED ORDER — SODIUM CHLORIDE (PF) 0.9 % IJ SOLN
INTRAMUSCULAR | Status: DC | PRN
  Administered 2018-01-25: 50 mL

## 2018-01-25 MED ORDER — ROCURONIUM BROMIDE 10 MG/ML (PF) SYRINGE
PREFILLED_SYRINGE | INTRAVENOUS | Status: DC | PRN
  Administered 2018-01-25: 10 mg via INTRAVENOUS
  Administered 2018-01-25: 70 mg via INTRAVENOUS

## 2018-01-25 MED ORDER — PROMETHAZINE HCL 25 MG/ML IJ SOLN
12.5000 mg | Freq: Four times a day (QID) | INTRAMUSCULAR | Status: DC | PRN
  Administered 2018-01-25: 12.5 mg via INTRAVENOUS
  Administered 2018-01-26: 6.25 mg via INTRAVENOUS
  Administered 2018-01-26 (×2): 12.5 mg via INTRAVENOUS
  Filled 2018-01-25 (×4): qty 1

## 2018-01-25 MED ORDER — AMLODIPINE BESYLATE 5 MG PO TABS
5.0000 mg | ORAL_TABLET | Freq: Every day | ORAL | Status: DC
  Administered 2018-01-26: 5 mg via ORAL
  Filled 2018-01-25: qty 1

## 2018-01-25 MED ORDER — LIDOCAINE 2% (20 MG/ML) 5 ML SYRINGE
INTRAMUSCULAR | Status: DC | PRN
  Administered 2018-01-25: 80 mg via INTRAVENOUS

## 2018-01-25 MED ORDER — KETAMINE HCL 10 MG/ML IJ SOLN
INTRAMUSCULAR | Status: DC | PRN
  Administered 2018-01-25: 10 mg via INTRAVENOUS
  Administered 2018-01-25 (×2): 20 mg via INTRAVENOUS

## 2018-01-25 SURGICAL SUPPLY — 62 items
APPLICATOR COTTON TIP 6 STRL (MISCELLANEOUS) IMPLANT
APPLICATOR COTTON TIP 6IN STRL (MISCELLANEOUS)
APPLIER CLIP ROT 10 11.4 M/L (STAPLE)
APPLIER CLIP ROT 13.4 12 LRG (CLIP)
BANDAGE ADH SHEER 1  50/CT (GAUZE/BANDAGES/DRESSINGS) ×12 IMPLANT
BENZOIN TINCTURE PRP APPL 2/3 (GAUZE/BANDAGES/DRESSINGS) ×2 IMPLANT
BLADE SURG SZ11 CARB STEEL (BLADE) ×2 IMPLANT
CABLE HIGH FREQUENCY MONO STRZ (ELECTRODE) ×2 IMPLANT
CHLORAPREP W/TINT 26ML (MISCELLANEOUS) ×4 IMPLANT
CLIP APPLIE ROT 10 11.4 M/L (STAPLE) IMPLANT
CLIP APPLIE ROT 13.4 12 LRG (CLIP) IMPLANT
COVER SURGICAL LIGHT HANDLE (MISCELLANEOUS) ×2 IMPLANT
COVER WAND RF STERILE (DRAPES) ×2 IMPLANT
DECANTER SPIKE VIAL GLASS SM (MISCELLANEOUS) ×2 IMPLANT
DEVICE SUT QUICK LOAD TK 5 (STAPLE) ×2 IMPLANT
DEVICE SUT TI-KNOT TK 5X26 (MISCELLANEOUS) ×2 IMPLANT
DEVICE SUTURE ENDOST 10MM (ENDOMECHANICALS) ×2 IMPLANT
DISSECTOR BLUNT TIP ENDO 5MM (MISCELLANEOUS) IMPLANT
DRAPE UTILITY XL STRL (DRAPES) ×4 IMPLANT
ELECT PENCIL ROCKER SW 15FT (MISCELLANEOUS) IMPLANT
ELECT REM PT RETURN 15FT ADLT (MISCELLANEOUS) ×2 IMPLANT
GLOVE BIO SURGEON STRL SZ7.5 (GLOVE) ×2 IMPLANT
GLOVE INDICATOR 8.0 STRL GRN (GLOVE) ×2 IMPLANT
GOWN STRL REUS W/TWL XL LVL3 (GOWN DISPOSABLE) ×6 IMPLANT
GRASPER SUT TROCAR 14GX15 (MISCELLANEOUS) ×2 IMPLANT
HOVERMATT SINGLE USE (MISCELLANEOUS) ×2 IMPLANT
KIT BASIN OR (CUSTOM PROCEDURE TRAY) ×2 IMPLANT
KIT CLEAN ENDO COMPLIANCE (KITS) ×2 IMPLANT
MARKER SKIN DUAL TIP RULER LAB (MISCELLANEOUS) ×2 IMPLANT
NEEDLE SPNL 22GX3.5 QUINCKE BK (NEEDLE) ×2 IMPLANT
PACK UNIVERSAL I (CUSTOM PROCEDURE TRAY) ×2 IMPLANT
RELOAD STAPLER 60MM BLK (STAPLE) IMPLANT
RELOAD STAPLER BLUE 60MM (STAPLE) ×3 IMPLANT
RELOAD STAPLER GOLD 60MM (STAPLE) IMPLANT
RELOAD STAPLER GREEN 60MM (STAPLE) ×2 IMPLANT
SEALANT SURGICAL APPL DUAL CAN (MISCELLANEOUS) IMPLANT
SET IRRIG TUBING LAPAROSCOPIC (IRRIGATION / IRRIGATOR) ×2 IMPLANT
SHEARS HARMONIC ACE PLUS 45CM (MISCELLANEOUS) ×2 IMPLANT
SLEEVE GASTRECTOMY 40FR VISIGI (MISCELLANEOUS) ×2 IMPLANT
SLEEVE XCEL OPT CAN 5 100 (ENDOMECHANICALS) ×6 IMPLANT
SOLUTION ANTI FOG 6CC (MISCELLANEOUS) ×2 IMPLANT
SPONGE LAP 18X18 RF (DISPOSABLE) ×2 IMPLANT
STAPLER ECHELON BIOABSB 60 FLE (MISCELLANEOUS) ×10 IMPLANT
STAPLER ECHELON LONG 60 440 (INSTRUMENTS) ×2 IMPLANT
STAPLER RELOAD 60MM BLK (STAPLE)
STAPLER RELOAD BLUE 60MM (STAPLE) ×6
STAPLER RELOAD GOLD 60MM (STAPLE)
STAPLER RELOAD GREEN 60MM (STAPLE) ×4
STRIP CLOSURE SKIN 1/2X4 (GAUZE/BANDAGES/DRESSINGS) ×2 IMPLANT
SUT MNCRL AB 4-0 PS2 18 (SUTURE) ×2 IMPLANT
SUT SURGIDAC NAB ES-9 0 48 120 (SUTURE) ×2 IMPLANT
SUT VICRYL 0 TIES 12 18 (SUTURE) ×2 IMPLANT
SYR 20CC LL (SYRINGE) ×2 IMPLANT
SYR 50ML LL SCALE MARK (SYRINGE) IMPLANT
TOWEL OR 17X26 10 PK STRL BLUE (TOWEL DISPOSABLE) ×2 IMPLANT
TOWEL OR NON WOVEN STRL DISP B (DISPOSABLE) ×2 IMPLANT
TROCAR BLADELESS 15MM (ENDOMECHANICALS) ×2 IMPLANT
TROCAR BLADELESS OPT 5 100 (ENDOMECHANICALS) ×2 IMPLANT
TUBE CALIBRATION LAPBAND (TUBING) ×2 IMPLANT
TUBING CONNECTING 10 (TUBING) ×4 IMPLANT
TUBING ENDO SMARTCAP (MISCELLANEOUS) ×2 IMPLANT
TUBING INSUF HEATED (TUBING) ×2 IMPLANT

## 2018-01-25 NOTE — Anesthesia Postprocedure Evaluation (Signed)
Anesthesia Post Note  Patient: Alexis Chase  Procedure(s) Performed: LAPAROSCOPIC GASTRIC SLEEVE RESECTION WITH HIATAL HERNIA REPAIR, UPPER ENDO AND ERAS PATHWAY (N/A )     Patient location during evaluation: PACU Anesthesia Type: General Level of consciousness: awake and alert Pain management: pain level controlled Vital Signs Assessment: post-procedure vital signs reviewed and stable Respiratory status: spontaneous breathing, nonlabored ventilation, respiratory function stable and patient connected to nasal cannula oxygen Cardiovascular status: blood pressure returned to baseline and stable Postop Assessment: no apparent nausea or vomiting Anesthetic complications: no    Last Vitals:  Vitals:   01/25/18 1240 01/25/18 1245  BP: (!) 144/90 (!) 151/85  Pulse: 89 74  Resp: (!) 21 16  Temp: 36.5 C   SpO2: 95% 96%    Last Pain:  Vitals:   01/25/18 1300  TempSrc:   PainSc: 7                  Keidrick Murty DAVID

## 2018-01-25 NOTE — Op Note (Signed)
01/25/2018 Alexis Chase 08-24-1970 [id number]   PRE-OPERATIVE DIAGNOSIS:     Morbid obesity BMI 39   Essential hypertension, on Norvasc   Hyperlipidemia, on statin   History of breast cancer, ER +, HER2 -, grade 3, s/p chemo and radiation. 3 year anniversery recently   Prediabetes   SUI (stress urinary incontinence, female)   Metabolic syndrome  POST-OPERATIVE DIAGNOSIS:  Same + small hiatal hernia  PROCEDURE:  Procedure(s): LAPAROSCOPIC SLEEVE GASTRECTOMY WITH HIATAL HERNIA REPAIR UPPER GI ENDOSCOPY  SURGEON:  Surgeon(s): Gayland Curry, MD FACS FASMBS  ASSISTANTS: Gurney Maxin MD FACS  ANESTHESIA:   general  DRAINS: none   BOUGIE: 40 fr ViSiGi  LOCAL MEDICATIONS USED:   Exparel  EBL: minimal  SPECIMEN:  Source of Specimen:  Greater curvature of stomach  DISPOSITION OF SPECIMEN:  PATHOLOGY  COUNTS:  YES  INDICATION FOR PROCEDURE: This is a very pleasant 47 y.o.-year-old morbidly obese female who has had unsuccessful attempts for sustained weight loss. The patient presents today for a planned laparoscopic sleeve gastrectomy with upper endoscopy. We have discussed the risk and benefits of the procedure extensively preoperatively. Please see my separate notes.  PROCEDURE: After obtaining informed consent and receiving 5000 units of subcutaneous heparin, the patient was brought to the operating room at [facility] and placed supine on the operating room table. General endotracheal anesthesia was established. Sequential compression devices were placed. A orogastric tube was placed. The patient's abdomen was prepped and draped in the usual standard surgical fashion. The patient received preoperative IV antibiotic. A surgical timeout was performed. ERAS protocol used.   Access to the abdomen was achieved using a 5 mm 0 laparoscope thru a 5 mm trocar In the left upper Quadrant 2 fingerbreadths below the left subcostal margin using the Optiview technique.  Pneumoperitoneum was smoothly established up to 15 mm of mercury. The laparoscope was advanced and the abdominal cavity was surveilled. The patient was then placed in reverse Trendelenburg.   A 5 mm trocar was placed slightly above and to the left of the umbilicus under direct visualization.  The [facility] liver retractor was placed under the left lobe of the liver through a 5 mm trocar incision site in the subxiphoid position. A 5 mm trocar was placed in the lateral right upper quadrant along with a 15 mm trocar in the mid right abdomen. A final 5 mm trocar was placed in the lateral LUQ.  All under direct visualization after exparel had been infiltrated in bilateral lateral upper abdominal walls as a TAP block.  The stomach was inspected. It was completely decompressed and the orogastric tube was removed.  There was a small anterior dimple that was obviously visible. Her preop UGI showed no hiatal hernia. The calibration tube was placed in the oropharynx and guided down into the stomach by the CRNA. 10 mL of air was insufflated into the calibration balloon. The calibration tubing was then gently pulled back by the CRNA and it slid past the GE junction. At this point the calibration tubing was desufflated and pulled back into the esophagus. This confirmed my suspicion of a clinically small hiatal hernia. The gastrohepatic ligament was incised with harmonic scalpel. The right crus was identified. We identified the crossing fat along the right crus. The adipose tissue just above this area was incised with harmonic scalpel. I then bluntly dissected out this area and identified the left crus. There was evidence of a hiatal hernia. I then mobilized the esophagus. The left  and right crus were further mobilized with blunt dissection. I was then able to reapproximate the left and right crus with 0 Ethibond using an Endostitch suture device and securing it with a titanium tyknot. We then had the CRNA readvanced the  calibration tubing back into the stomach. 10 mL of air was insufflated into the calibration tube balloon. The calibration tube was then gently pulled back and there was resistance at the GE junction. The tube did not slide back up into the esophagus. At this point the calibration tubing was deflated and removed from the patient's body.   We identified the pylorus and measured 6 cm proximal to the pylorus and identified an area of where we would start taking down the short gastric vessels. Harmonic scalpel was used to take down the short gastric vessels along the greater curvature of the stomach. We were able to enter the lesser sac. We continued to march along the greater curvature of the stomach taking down the short gastrics. As we approached the gastrosplenic ligament we took care in this area not to injure the spleen. We were able to take down the entire gastrosplenic ligament. We then mobilized the fundus away from the left crus of diaphragm. There were some posterior gastric avascular attachments which were taken down. This left the stomach completely mobilized. No vessels had been taken down along the lesser curvature of the stomach.  We then reidentified the pylorus. A 40Fr ViSiGi was then placed in the oropharynx and advanced down into the stomach and placed in the distal antrum and positioned along the lesser curvature. It was placed under suction which secured the 40Fr ViSiGi in place along the lesser curve. Then using the Ethicon echelon 60 mm stapler with a green load with Seamguard, I placed a stapler along the antrum approximately 5 cm from the pylorus. The stapler was angled so that there is ample room at the angularis incisura. I then fired the first staple load after inspecting it posteriorly to ensure adequate space both anteriorly and posteriorly. At this point I still was not completely past the angularis so with another green load with Seamguard, I placed the stapler in position just  inside the prior stapleline. We then rotated the stomach to insure that there was adequate anteriorly as well as posteriorly. The stapler was then fired.  At this point I started using 60 mm blue load staple cartridges with Seamguard. The echelon stapler was then repositioned with a 60 mm blue load with Seamguard and we continued to march up along the [place]. My assistant was holding traction along the greater curvature stomach along the cauterized short gastric vessels ensuring that the stomach was symmetrically retracted. Prior to each firing of the staple, we rotated the stomach to ensure that there is adequate stomach left.  As we approached the fundus, I used 60 mm blue cartridge with Seamguard aiming  lateral to the GE junction after mobilizing some of the esophageal fat pad.  The sleeve was inspected. There is no evidence of cork screw. The staple line appeared hemostatic. The CRNA inflated the ViSiGi to the green zone and the upper abdomen was flooded with saline. There were no bubbles. The sleeve was decompressed and the ViSiGi removed. My assistant scrubbed out and performed an upper endoscopy. The sleeve easily distended with air and the scope was easily advanced to the pylorus. There is no evidence of internal bleeding or cork screwing. There was no narrowing at the angularis. There is no evidence  of bubbles. Please see his operative note for further details. The gastric sleeve was decompressed and the endoscope was removed.  The greater curvature the stomach was grasped with a laparoscopic grasper and removed from the 15 mm trocar site.  The liver retractor was removed. I then closed the 15 mm trocar site with 1 interrupted 0 Vicryl sutures through the fascia using the endoclose. The closure was viewed laparoscopically and it was airtight. Remaining Exparel was then infiltrated in the preperitoneal spaces around the trocar sites. Pneumoperitoneum was released. All trocar sites were closed with a 4-0  Monocryl in a subcuticular fashion followed by the application of benzoin, steri-strips, and bandaids. The patient was extubated and taken to the recovery room in stable condition. All needle, instrument, and sponge counts were correct x2. There are no immediate complications  (2) 60 mm green with Seamguard (0) 60 mm gold with seamguard (3) 60 mm blue with  seamguard  PLAN OF CARE: Admit to inpatient   PATIENT DISPOSITION:  PACU - hemodynamically stable.   Delay start of Pharmacological VTE agent (>24hrs) due to surgical blood loss or risk of bleeding:  no  Leighton Ruff. Redmond Pulling, MD, FACS FASMBS General, Bariatric, & Minimally Invasive Surgery [facility], [place]

## 2018-01-25 NOTE — Anesthesia Procedure Notes (Signed)
Procedure Name: Intubation Date/Time: 01/25/2018 11:16 AM Performed by: Lavina Hamman, CRNA Pre-anesthesia Checklist: Patient identified, Emergency Drugs available, Suction available, Patient being monitored and Timeout performed Patient Re-evaluated:Patient Re-evaluated prior to induction Oxygen Delivery Method: Circle system utilized Preoxygenation: Pre-oxygenation with 100% oxygen Induction Type: IV induction Ventilation: Mask ventilation without difficulty Laryngoscope Size: Mac and 4 Grade View: Grade I Tube type: Oral Tube size: 7.0 mm Number of attempts: 1 Airway Equipment and Method: Stylet Placement Confirmation: ETT inserted through vocal cords under direct vision,  positive ETCO2,  CO2 detector and breath sounds checked- equal and bilateral Secured at: 22 cm Tube secured with: Tape Dental Injury: Teeth and Oropharynx as per pre-operative assessment

## 2018-01-25 NOTE — Interval H&P Note (Signed)
History and Physical Interval Note:  01/25/2018 10:23 AM  Alexis Chase  has presented today for surgery, with the diagnosis of MORBID OBESITY  The various methods of treatment have been discussed with the patient and family. After consideration of risks, benefits and other options for treatment, the patient has consented to  Procedure(s): LAPAROSCOPIC GASTRIC SLEEVE RESECTION WITH UPPER ENDO AND ERAS PATHWAY (N/A) as a surgical intervention .  The patient's history has been reviewed, patient examined, no change in status, stable for surgery.  I have reviewed the patient's chart and labs.  Questions were answered to the patient's satisfaction.    Leighton Ruff. Redmond Pulling, MD, FACS General, Bariatric, & Minimally Invasive Surgery [facility] [place]  [name]

## 2018-01-25 NOTE — Anesthesia Preprocedure Evaluation (Addendum)
Anesthesia Evaluation  Patient identified by MRN, date of birth, ID band Patient awake    Reviewed: Allergy & Precautions, NPO status , Patient's Chart, lab work & pertinent test results  Airway Mallampati: II  TM Distance: <3 FB Neck ROM: Full    Dental no notable dental hx.    Pulmonary neg pulmonary ROS, former smoker,    breath sounds clear to auscultation (-) decreased breath sounds      Cardiovascular hypertension, Normal cardiovascular exam Rhythm:Regular Rate:Normal     Neuro/Psych negative neurological ROS  negative psych ROS   GI/Hepatic negative GI ROS, Neg liver ROS,   Endo/Other  Morbid obesity  Renal/GU negative Renal ROS  negative genitourinary   Musculoskeletal negative musculoskeletal ROS (+)   Abdominal (+) - obese,   Peds negative pediatric ROS (+)  Hematology negative hematology ROS (+)   Anesthesia Other Findings   Reproductive/Obstetrics negative OB ROS                            Anesthesia Physical Anesthesia Plan  ASA: II  Anesthesia Plan: General   Post-op Pain Management:    Induction: Intravenous  PONV Risk Score and Plan: 3 and Ondansetron, Dexamethasone, Treatment may vary due to age or medical condition, Scopolamine patch - Pre-op and Midazolam  Airway Management Planned: Oral ETT  Additional Equipment:   Intra-op Plan:   Post-operative Plan: Extubation in OR  Informed Consent: I have reviewed the patients History and Physical, chart, labs and discussed the procedure including the risks, benefits and alternatives for the proposed anesthesia with the patient or authorized representative who has indicated his/her understanding and acceptance.   Dental advisory given  Plan Discussed with: CRNA and Surgeon  Anesthesia Plan Comments:        Anesthesia Quick Evaluation

## 2018-01-25 NOTE — Progress Notes (Signed)
Patient arrived to room.  Awake and oriented.  Having pain and nausea.  Questions answered.  Will continue to monitor.

## 2018-01-25 NOTE — Progress Notes (Addendum)
PHARMACY CONSULT FOR:  Risk Assessment for Post-Discharge VTE Following Bariatric Surgery  Post-Discharge VTE Risk Assessment: This patient's probability of 30-day post-discharge VTE is increased due to the factors marked:   Female    Age >/=60 years    BMI >/=50 kg/m2    CHF    Dyspnea at Rest    Paraplegia  X  Non-gastric-band surgery    Operation Time >/=3 hr    Return to OR     Length of Stay >/= 3 d   Predicted probability of 30-day post-discharge VTE: 0.16%  Other patient-specific factors to consider: none   Recommendation for Discharge:  No pharmacologic prophylaxis post-discharge Follow daily and recalculate estimated 30d VTE risk if returns to OR or LOS reaches 3 days   Alexis Chase is a 47 y.o. female who underwent  laparoscopic sleeve gastrectomy (procedure) on 12/2(date)   Case start: 1121 Case end: 1231   Allergies  Allergen Reactions  . Acyclovir And Related     Patient Measurements: Weight: 248 lb (112.5 kg) Body mass index is 38.84 kg/m.  Recent Labs    01/25/18 1348  HGB 13.7  HCT 42.1   Estimated Creatinine Clearance: 104.8 mL/min (by C-G formula based on SCr of 0.85 mg/dL).    Past Medical History:  Diagnosis Date  . Anemia    resolved   . Breast cancer ([place]) 10/14/2013  . Closed displaced fracture of acromial end of right clavicle 03/09/2015  . Dysthymia 02/07/2017  . Essential hypertension 02/07/2017  . History of breast cancer 02/07/2017   Left Breast   . PCOS (polycystic ovarian syndrome)    per patient    . Personal history of radiation therapy    and chemo d/t l breast CA  . Pure hypercholesterolemia 02/07/2017     Medications Prior to Admission  Medication Sig Dispense Refill Last Dose  . amLODipine (NORVASC) 5 MG tablet Take 1 tablet (5 mg total) by mouth daily. 30 tablet 3 Taking  . aspirin EC 81 MG tablet Take 81 mg by mouth daily.     Marland Kitchen atorvastatin (LIPITOR) 20 MG tablet Take 1 tablet (20 mg total) by mouth daily.  90 tablet 1 Taking  . Cholecalciferol (VITAMIN D PO) Take 5,000 Units by mouth daily.   Taking  . escitalopram (LEXAPRO) 10 MG tablet Take 1 tablet (10 mg total) by mouth daily. 30 tablet 3 Taking  . letrozole (FEMARA) 2.5 MG tablet TAKE 1 TABLET BY MOUTH DAILY. (Patient taking differently: Take 2.5 mg by mouth daily. ) 30 tablet 3   . metFORMIN (GLUCOPHAGE XR) 500 MG 24 hr tablet Take 1 tablet (500 mg total) by mouth daily with breakfast. 90 tablet 0 Taking  . Multiple Vitamin (MULTIVITAMIN) tablet Take 1 tablet by mouth 2 (two) times daily. Nutra Essentials   Taking  . Omega-3 Fatty Acids (EPA PO) Take 4 capsules by mouth 2 (two) times daily.     Marland Kitchen OVER THE COUNTER MEDICATION Take 1 tablet by mouth daily. DIM     . Probiotic Product (PROBIOTIC DAILY PO) Take 1 tablet by mouth daily.   Taking  . Turmeric (CURCUMIN 95 PO) Take 1 capsule by mouth 2 (two) times daily.    Taking  . Ubiquinol (UBQH PO) Take 1 tablet by mouth 2 (two) times daily.   Taking       Reuel Boom, PharmD, BCPS [phone] 01/25/2018, 8:20 PM

## 2018-01-25 NOTE — Op Note (Signed)
Preoperative diagnosis: laparoscopic sleeve gastrectomy  Postoperative diagnosis: Same   Procedure: Upper endoscopy   Surgeon: Luke Kinsinger, M.D.  Anesthesia: Gen.   Indications for procedure: This patient was undergoing a laparoscopic sleeve gastrectomy.   Description of procedure: The endoscopy was placed in the mouth and into the oropharynx and under endoscopic vision it was advanced to the esophagogastric junction. The pouch was insufflated and no bleeding or bubbles were seen. The GEJ was identified at 40cm from the teeth.  No bleeding or leaks were detected. The scope was withdrawn without difficulty.   Luke Kinsinger, M.D. General, Bariatric, & Minimally Invasive Surgery [facility]    

## 2018-01-25 NOTE — Transfer of Care (Signed)
Immediate Anesthesia Transfer of Care Note  Patient: Alexis Chase  Procedure(s) Performed: Procedure(s): LAPAROSCOPIC GASTRIC SLEEVE RESECTION WITH HIATAL HERNIA REPAIR, UPPER ENDO AND ERAS PATHWAY (N/A)  Patient Location: PACU  Anesthesia Type:General  Level of Consciousness:  sedated, patient cooperative and responds to stimulation  Airway & Oxygen Therapy:Patient Spontanous Breathing and Patient connected to face mask oxgen  Post-op Assessment:  Report given to PACU RN and Post -op Vital signs reviewed and stable  Post vital signs:  Reviewed and stable  Last Vitals:  Vitals:   01/25/18 0850  BP: 125/81  Pulse: 78  Resp: 15  Temp: (!) 36.3 C  SpO2: 445%    Complications: No apparent anesthesia complications

## 2018-01-26 ENCOUNTER — Encounter (HOSPITAL_COMMUNITY): Payer: Self-pay | Admitting: General Surgery

## 2018-01-26 LAB — CBC WITH DIFFERENTIAL/PLATELET
Abs Immature Granulocytes: 0.07 10*3/uL (ref 0.00–0.07)
Basophils Absolute: 0 10*3/uL (ref 0.0–0.1)
Basophils Relative: 0 %
Eosinophils Absolute: 0 10*3/uL (ref 0.0–0.5)
Eosinophils Relative: 0 %
HCT: 42.3 % (ref 36.0–46.0)
HEMOGLOBIN: 13.6 g/dL (ref 12.0–15.0)
Immature Granulocytes: 1 %
Lymphocytes Relative: 10 %
Lymphs Abs: 1.5 10*3/uL (ref 0.7–4.0)
MCH: 30.6 pg (ref 26.0–34.0)
MCHC: 32.2 g/dL (ref 30.0–36.0)
MCV: 95.1 fL (ref 80.0–100.0)
MONO ABS: 0.7 10*3/uL (ref 0.1–1.0)
MONOS PCT: 4 %
Neutro Abs: 13 10*3/uL — ABNORMAL HIGH (ref 1.7–7.7)
Neutrophils Relative %: 85 %
Platelets: 245 10*3/uL (ref 150–400)
RBC: 4.45 MIL/uL (ref 3.87–5.11)
RDW: 12.6 % (ref 11.5–15.5)
WBC: 15.2 10*3/uL — ABNORMAL HIGH (ref 4.0–10.5)
nRBC: 0 % (ref 0.0–0.2)

## 2018-01-26 LAB — COMPREHENSIVE METABOLIC PANEL
ALK PHOS: 98 U/L (ref 38–126)
ALT: 46 U/L — ABNORMAL HIGH (ref 0–44)
AST: 41 U/L (ref 15–41)
Albumin: 3.8 g/dL (ref 3.5–5.0)
Anion gap: 11 (ref 5–15)
BUN: 15 mg/dL (ref 6–20)
CALCIUM: 9.3 mg/dL (ref 8.9–10.3)
CO2: 23 mmol/L (ref 22–32)
Chloride: 105 mmol/L (ref 98–111)
Creatinine, Ser: 0.9 mg/dL (ref 0.44–1.00)
GFR calc Af Amer: >60 mL/min (ref >60)
Glucose, Bld: 119 mg/dL — ABNORMAL HIGH (ref 70–99)
Potassium: 4.8 mmol/L (ref 3.5–5.1)
Sodium: 139 mmol/L (ref 135–145)
Total Bilirubin: 0.8 mg/dL (ref 0.3–1.2)
Total Protein: 7.2 g/dL (ref 6.5–8.1)

## 2018-01-26 LAB — GLUCOSE, CAPILLARY
GLUCOSE-CAPILLARY: 73 mg/dL (ref 70–99)
Glucose-Capillary: 126 mg/dL — ABNORMAL HIGH (ref 70–99)
Glucose-Capillary: 122 mg/dL — ABNORMAL HIGH (ref 70–99)
Glucose-Capillary: 90 mg/dL (ref 70–99)
Glucose-Capillary: 72 mg/dL (ref 70–99)
Glucose-Capillary: 68 mg/dL — ABNORMAL LOW (ref 70–99)
Glucose-Capillary: 78 mg/dL (ref 70–99)
Glucose-Capillary: 68 mg/dL — ABNORMAL LOW (ref 70–99)

## 2018-01-26 MED ORDER — PANTOPRAZOLE SODIUM 40 MG PO TBEC: 40.0000 mg | DELAYED_RELEASE_TABLET | Freq: Every day | ORAL | 0 refills | Status: DC

## 2018-01-26 MED ORDER — TRAMADOL HCL 50 MG PO TABS: 50.0000 mg | ORAL_TABLET | Freq: Four times a day (QID) | ORAL | 0 refills | Status: DC | PRN

## 2018-01-26 MED ORDER — ONDANSETRON 4 MG PO TBDP: 4.0000 mg | ORAL_TABLET | Freq: Four times a day (QID) | ORAL | 0 refills | Status: DC | PRN

## 2018-01-26 MED FILL — traMADol HCL 50 MG TABS: 50 | 3 days supply | Qty: 15 | Fill #0

## 2018-01-26 MED FILL — ONDANSETRON ODT 4 MG TABLET: 4 | 5 days supply | Qty: 20 | Fill #0

## 2018-01-26 MED FILL — PANTOPRAZOLE SOD DR 40 MG T: 40 | 30 days supply | Qty: 30 | Fill #0

## 2018-01-26 NOTE — Plan of Care (Signed)

## 2018-01-26 NOTE — Discharge Instructions (Signed)
° ° ° °GASTRIC BYPASS/SLEEVE ° Home Care Instructions ° ° These instructions are to help you care for yourself when you go home. ° °Call: If you have any problems. °• Call [phone] and ask for the surgeon on call °• If you need immediate help, come to the ER at [facility].  °• Tell the ER staff that you are a new post-op gastric bypass or gastric sleeve patient °  °Signs and symptoms to report: • Severe vomiting or nausea °o If you cannot keep down clear liquids for longer than 1 day, call your surgeon  °• Abdominal pain that does not get better after taking your pain medication °• Fever over 100.4° F with chills °• Heart beating over 100 beats a minute °• Shortness of breath at rest °• Chest pain °•  Redness, swelling, drainage, or foul odor at incision (surgical) sites °•  If your incisions open or pull apart °• Swelling or pain in calf (lower leg) °• Diarrhea (Loose bowel movements that happen often), frequent watery, uncontrolled bowel movements °• Constipation, (no bowel movements for 3 days) if this happens: Pick one °o Milk of Magnesia, 2 tablespoons by mouth, 3 times a day for 2 days if needed °o Stop taking Milk of Magnesia once you have a bowel movement °o Call your doctor if constipation continues °Or °o Miralax  (instead of Milk of Magnesia) following the label instructions °o Stop taking Miralax once you have a bowel movement °o Call your doctor if constipation continues °• Anything you think is not normal °  °Normal side effects after surgery: • Unable to sleep at night or unable to focus °• Irritability or moody °• Being tearful (crying) or depressed °These are common complaints, possibly related to your anesthesia medications that put you to sleep, stress of surgery, and change in lifestyle.  This usually goes away a few weeks after surgery.  If these feelings continue, call your primary care doctor. °  °Wound Care: You may have surgical glue, steri-strips, or staples over your incisions after  surgery °• Surgical glue:  Looks like a clear film over your incisions and will wear off a little at a time °• Steri-strips: Strips of tape over your incisions. You may notice a yellowish color on the skin under the steri-strips. This is used to make the   steri-strips stick better. Do not pull the steri-strips off - let them fall off °• Staples: Staples may be removed before you leave the hospital °o If you go home with staples, call [facility], [phone] at for an appointment with your surgeon’s nurse to have staples removed 10 days after surgery. °• Showering: You may shower two (2) days after your surgery unless your surgeon tells you differently °o Wash gently around incisions with warm soapy water, rinse well, and gently pat dry  °o No tub baths until staples are removed, steri-strips fall off or glue is gone.  °  °Medications: • Medications should be liquid or crushed if larger than the size of a dime °• Extended release pills (medication that release a little bit at a time through the day) should NOT be crushed or cut. (examples include XL, ER, DR, SR) °• Depending on the size and number of medications you take, you may need to space (take a few throughout the day)/change the time you take your medications so that you do not over-fill your pouch (smaller stomach) °• Make sure you follow-up with your primary care doctor to   make medication changes needed during rapid weight loss and life-style changes °• If you have diabetes, follow up with the doctor that orders your diabetes medication(s) within one week after surgery and check your blood sugar regularly. °• Do not drive while taking prescription pain medication  °• It is ok to take Tylenol by the bottle instructions with your pain medicine or instead of your pain medicine as needed.  DO NOT TAKE NSAIDS (EXAMPLES OF NSAIDS:  IBUPROFREN/ NAPROXEN)  °Diet:                    First 2 Weeks ° You will see the dietician t about two (2) weeks  after your surgery. The dietician will increase the types of foods you can eat if you are handling liquids well: °• If you have severe vomiting or nausea and cannot keep down clear liquids lasting longer than 1 day, call your surgeon @ ([phone]) °Protein Shake °• Drink at least 2 ounces of shake 5-6 times per day °• Each serving of protein shakes (usually 8 - 12 ounces) should have: °o 15 grams of protein  °o And no more than 5 grams of carbohydrate  °• Goal for protein each day: °o Men = 80 grams per day °o Women = 60 grams per day °• Protein powder may be added to fluids such as non-fat milk or Lactaid milk or unsweetened Soy/Almond milk (limit to 35 grams added protein powder per serving) ° °Hydration °• Slowly increase the amount of water and other clear liquids as tolerated (See Acceptable Fluids) °• Slowly increase the amount of protein shake as tolerated  °•  Sip fluids slowly and throughout the day.  Do not use straws. °• May use sugar substitutes in small amounts (no more than 6 - 8 packets per day; i.e. Splenda) ° °Fluid Goal °• The first goal is to drink at least 8 ounces of protein shake/drink per day (or as directed by the nutritionist); some examples of protein shakes are Syntrax Nectar, Adkins Advantage, EAS Edge HP, and Unjury. See handout from pre-op Bariatric Education Class: °o Slowly increase the amount of protein shake you drink as tolerated °o You may find it easier to slowly sip shakes throughout the day °o It is important to get your proteins in first °• Your fluid goal is to drink 64 - 100 ounces of fluid daily °o It may take a few weeks to build up to this °• 32 oz (or more) should be clear liquids  °And  °• 32 oz (or more) should be full liquids (see below for examples) °• Liquids should not contain sugar, caffeine, or carbonation ° °Clear Liquids: °• Water or Sugar-free flavored water (i.e. Fruit H2O, Propel) °• Decaffeinated coffee or tea (sugar-free) °• Crystal Lite, Wyler’s Lite,  Minute Maid Lite °• Sugar-free Jell-O °• Bouillon or broth °• Sugar-free Popsicle:   *Less than 20 calories each; Limit 1 per day ° °Full Liquids: °Protein Shakes/Drinks + 2 choices per day of other full liquids °• Full liquids must be: °o No More Than 15 grams of Carbs per serving  °o No More Than 3 grams of Fat per serving °• Strained low-fat cream soup (except Cream of Potato or Tomato) °• Non-Fat milk °• Fat-free Lactaid Milk °• Unsweetened Soy Or Unsweetened Almond Milk °• Low Sugar yogurt (Dannon Lite & Fit, Greek yogurt; Oikos Triple Zero; Chobani Simply 100; Yoplait 100 calorie Greek - No Fruit on the Bottom) ° °  °Vitamins   and Minerals • Start 1 day after surgery unless otherwise directed by your surgeon °• 2 Chewable Bariatric Specific Multivitamin / Multimineral Supplement with iron (Example: Bariatric Advantage Multi EA) °• Chewable Calcium with Vitamin D-3 °(Example: 3 Chewable Calcium Plus 600 with Vitamin D-3) °o Take 500 mg three (3) times a day for a total of 1500 mg each day °o Do not take all 3 doses of calcium at one time as it may cause constipation, and you can only absorb 500 mg  at a time  °o Do not mix multivitamins containing iron with calcium supplements; take 2 hours apart °• Menstruating women and those with a history of anemia (a blood disease that causes weakness) may need extra iron °o Talk with your doctor to see if you need more iron °• Do not stop taking or change any vitamins or minerals until you talk to your dietitian or surgeon °• Your Dietitian and/or surgeon must approve all vitamin and mineral supplements °  °Activity and Exercise: Limit your physical activity as instructed by your doctor.  It is important to continue walking at home.  During this time, use these guidelines: °• Do not lift anything greater than ten (10) pounds for at least two (2) weeks °• Do not go back to work or drive until your surgeon says you can °• You may have sex when you feel comfortable  °o It is  VERY important for female patients to use a reliable birth control method; fertility often increases after surgery  °o All hormonal birth control will be ineffective for 30 days after surgery due to medications given during surgery a barrier method must be used. °o Do not get pregnant for at least 18 months °• Start exercising as soon as your doctor tells you that you can °o Make sure your doctor approves any physical activity °• Start with a simple walking program °• Walk 5-15 minutes each day, 7 days per week.  °• Slowly increase until you are walking 30-45 minutes per day °Consider joining our BELT program. [phone] or email belt@[employer] °  °Special Instructions Things to remember: °• Use your CPAP when sleeping if this applies to you ° °• [facility] has two free Bariatric Surgery Support Groups that meet monthly °o The 3rd Thursday of each month, 6 pm, [facility]  °o The 2nd Friday of each month, 11:45 am in the private dining room in the basement of  °• It is very important to keep all follow up appointments with your surgeon, dietitian, primary care physician, and behavioral health practitioner °• Routine follow up schedule with your surgeon include appointments at 2-3 weeks, 6-8 weeks, 6 months, and 1 year at a minimum.  Your surgeon may request to see you more often.   °o After the first year, please follow up with your bariatric surgeon and dietitian at least once a year in order to maintain best weight loss results °[facility]: [phone] °[facility]: [phone] °Bariatric Nurse Coordinator: [phone] °  °   Reviewed and Endorsed  °by [facility] Patient Education Committee, June, 2016 °Edits Approved: Aug, 2018 ° ° ° °

## 2018-01-26 NOTE — Progress Notes (Addendum)
Patient given discharge instructions and began to feel nauseated after drinking protein shake.  Encouraged to walk with no relief of nausea.  IV phenergan given at 1308.  Reassessed patient at 51.  Sleepy but still feels nauseated and unable to drink.  Spouse at bedside concerned about oral intake. Message sent to Dr Redmond Pulling about the above.  Discharge cancelled will reevaluate in am.

## 2018-01-26 NOTE — Discharge Summary (Addendum)
Physician Discharge Summary  Alexis Chase KWI:[id number] DOB: 1970/12/04 DOA: 01/25/2018  PCP: Briscoe Deutscher, DO  Admit date: 01/25/2018 Discharge date: 12/4/201912/05/2017  Recommendations for Outpatient Follow-up:   Follow-up Information    [name], MD. Go on 02/10/2018.   Specialty:  General Surgery Why:  at 930.  Please arrive 15 minutes early for appointment Contact information: [address] [place] [address] [phone]        [name], PA-C. Go on 03/16/2018.   Specialty:  General Surgery Why:  at [address] information: [place]  [address] [phone]          Discharge Diagnoses:  Principal Problem:   Morbid obesity ([place]), interested in gastric sleeve Active Problems:   Essential hypertension, on Norvasc   Hyperlipidemia, on statin   History of breast cancer, ER +, HER2 -, grade 3, s/p chemo and radiation. 3 year anniversery recently   Prediabetes   SUI (stress urinary incontinence, female)   Metabolic syndrome   Severe obesity ([place])   Surgical Procedure: Laparoscopic Sleeve Gastrectomy with hiatal hernia repair, upper endoscopy  Discharge Condition: Good Disposition: Home  Diet recommendation: Postoperative sleeve gastrectomy diet (liquids only)  Filed Weights   01/25/18 1019 01/27/18 0526  Weight: 112.5 kg 112 kg     Hospital Course:  The patient was admitted for a planned laparoscopic sleeve gastrectomy. Please see operative note. She was found to have a small hiatal hernia which was repaired.  Preoperatively the patient was given 5000 units of subcutaneous heparin for DVT prophylaxis. Postoperative prophylactic Lovenox dosing was started on the evening of postoperative day 0. ERAS protocol was used. On the evening of postoperative day 0, the patient was started on water and ice chips. On postoperative day 1 the patient had no fever or tachycardia and was tolerating water in their diet was  gradually advanced throughout the day. The patient was ambulating without difficulty. Their vital signs are stable without fever or tachycardia. Their hemoglobin had remained stable. However she became nauseated and was kept for an additional night. On POD 2, her nausea had resolved and oral intake was improved.   The patient had received discharge instructions and counseling. They were deemed stable for discharge and had met discharge criteria. We discussed intraop findings.   BP 133/82 (BP Location: Right Arm)   Pulse 69   Temp 97.6 F (36.4 C) (Oral)   Resp 17   Wt 112 kg   SpO2 95%   BMI 38.67 kg/m   Gen: alert, NAD, non-toxic appearing Pupils: equal, no scleral icterus Pulm: Lungs clear to auscultation, symmetric chest rise CV: regular rate and rhythm Abd: soft, min tender, nondistended.  No cellulitis. No incisional hernia Ext: no edema, no calf tenderness Skin: no rash, no jaundice  Discharge Instructions  Discharge Instructions    Ambulate hourly while awake   Complete by:  As directed    Ambulate hourly while awake   Complete by:  As directed    Call MD for:  difficulty breathing, headache or visual disturbances   Complete by:  As directed    Call MD for:  difficulty breathing, headache or visual disturbances   Complete by:  As directed    Call MD for:  persistant dizziness or light-headedness   Complete by:  As directed    Call MD for:  persistant dizziness or light-headedness   Complete by:  As directed    Call MD for:  persistant nausea and vomiting   Complete by:  As directed    Call MD for:  persistant nausea and vomiting   Complete by:  As directed    Call MD for:  redness, tenderness, or signs of infection (pain, swelling, redness, odor or green/yellow discharge around incision site)   Complete by:  As directed    Call MD for:  redness, tenderness, or signs of infection (pain, swelling, redness, odor or green/yellow discharge around incision site)   Complete  by:  As directed    Call MD for:  severe uncontrolled pain   Complete by:  As directed    Call MD for:  severe uncontrolled pain   Complete by:  As directed    Call MD for:  temperature >101 F   Complete by:  As directed    Call MD for:  temperature >101 F   Complete by:  As directed    Diet bariatric full liquid   Complete by:  As directed    Diet bariatric full liquid   Complete by:  As directed    Discharge instructions   Complete by:  As directed    See bariatric discharge instructions   Discharge instructions   Complete by:  As directed    See bariatric discharge instructions   Incentive spirometry   Complete by:  As directed    Perform hourly while awake   Incentive spirometry   Complete by:  As directed    Perform hourly while awake     Allergies as of 01/27/2018      Reactions   Acyclovir And Related       Medication List    STOP taking these medications   aspirin EC 81 MG tablet   metFORMIN 500 MG 24 hr tablet Commonly known as:  GLUCOPHAGE-XR   OVER THE COUNTER MEDICATION     TAKE these medications   amLODipine 5 MG tablet Commonly known as:  NORVASC Take 1 tablet (5 mg total) by mouth daily. Notes to patient:  Monitor Blood Pressure Daily and keep a log for primary care physician.  You may need to make changes to your medications with rapid weight loss.     atorvastatin 20 MG tablet Commonly known as:  LIPITOR Take 1 tablet (20 mg total) by mouth daily.   CURCUMIN 95 PO Take 1 capsule by mouth 2 (two) times daily.   EPA PO Take 4 capsules by mouth 2 (two) times daily.   escitalopram 10 MG tablet Commonly known as:  LEXAPRO Take 1 tablet (10 mg total) by mouth daily.   letrozole 2.5 MG tablet Commonly known as:  FEMARA TAKE 1 TABLET BY MOUTH DAILY.   multivitamin tablet Take 1 tablet by mouth 2 (two) times daily. Nutra Essentials   ondansetron 4 MG disintegrating tablet Commonly known as:  ZOFRAN-ODT Take 1 tablet (4 mg total) by mouth  every 6 (six) hours as needed for nausea or vomiting.   pantoprazole 40 MG tablet Commonly known as:  PROTONIX Take 1 tablet (40 mg total) by mouth daily.   PROBIOTIC DAILY PO Take 1 tablet by mouth daily.   promethazine 12.5 MG tablet Commonly known as:  PHENERGAN Take 1 tablet (12.5 mg total) by mouth every 6 (six) hours as needed for nausea or vomiting.   traMADol 50 MG tablet Commonly known as:  ULTRAM Take 1 tablet (50 mg total) by mouth every 6 (six) hours as needed for severe pain (pain).   UBQH  PO Take 1 tablet by mouth 2 (two) times daily.   VITAMIN D PO Take 5,000 Units by mouth daily.      Follow-up Information    [name], MD. Go on 02/10/2018.   Specialty:  General Surgery Why:  at 930.  Please arrive 15 minutes early for appointment Contact information: [address] [place] [address] [phone]        [name], PA-C. Go on 03/16/2018.   Specialty:  General Surgery Why:  at 215 Contact information: [place] [address] [phone]            The results of significant diagnostics from this hospitalization (including imaging, microbiology, ancillary and laboratory) are listed below for reference.    Significant Diagnostic Studies: No results found.  Labs: Basic Metabolic Panel: Recent Labs  Lab 01/26/18 0506  NA 139  K 4.8  CL 105  CO2 23  GLUCOSE 119*  BUN 15  CREATININE 0.90  CALCIUM 9.3   Liver Function Tests: Recent Labs  Lab 01/26/18 0506  AST 41  ALT 46*  ALKPHOS 98  BILITOT 0.8  PROT 7.2  ALBUMIN 3.8    CBC: Recent Labs  Lab 01/25/18 1348 01/26/18 0506 01/27/18 0414  WBC  --  15.2* 11.2*  NEUTROABS  --  13.0* 7.6  HGB 13.7 13.6 13.3  HCT 42.1 42.3 41.0  MCV  --  95.1 94.9  PLT  --  245 222    CBG: Recent Labs  Lab 01/26/18 2030 01/27/18 0000 01/27/18 0028 01/27/18 0354 01/27/18 0744  GLUCAP 73 63* 84 88 97    Principal Problem:   Morbid  obesity ([place]), interested in gastric sleeve Active Problems:   Essential hypertension, on Norvasc   Hyperlipidemia, on statin   History of breast cancer, ER +, HER2 -, grade 3, s/p chemo and radiation. 3 year anniversery recently   Prediabetes   SUI (stress urinary incontinence, female)   Metabolic syndrome   Severe obesity ([place])   Time coordinating discharge: 15 min  Signed:  Gayland Curry, MD [facility], [place] [phone] 01/28/2018, 4:19 PM

## 2018-01-26 NOTE — Progress Notes (Signed)

## 2018-01-26 NOTE — Progress Notes (Addendum)
Hypoglycemic Event  CBG: 68  Treatment: Protein shake  Symptoms: None. Patient is awake & alert & oriented  Follow-up CBG: Time:2030 CBG Result:73  Possible Reasons for Event:   Comments/MD notified:    Keli Buehner

## 2018-01-26 NOTE — Progress Notes (Signed)
Patient hypoglycemic; she is awake and alert no and without nausea. Sipping on protein shake now and tolerating. Will recheck CBG and continue to monitor. Donne Hazel, RN

## 2018-01-26 NOTE — Care Management Note (Signed)
Case Management Note  Patient Details  Name: Alexis Chase MRN: [id number] Date of Birth: Jun 28, 1970  Subjective/Objective: Morbid obesity. S/p sleeve gastrectomy;hiatal hernia repair. From home. No CM needs.                   Action/Plan:dc home.   Expected Discharge Date:  01/26/18               Expected Discharge Plan:  Home/Self Care  In-House Referral:     Discharge planning Services  CM Consult  Post Acute Care Choice:    Choice offered to:     DME Arranged:    DME Agency:     HH Arranged:    HH Agency:     Status of Service:  Completed, signed off  If discussed at [employer] of Stay Meetings, dates discussed:    Additional Comments:  Dessa Phi, RN 01/26/2018, 11:13 AM

## 2018-01-27 LAB — CBC WITH DIFFERENTIAL/PLATELET
Abs Immature Granulocytes: 0.04 10*3/uL (ref 0.00–0.07)
Basophils Absolute: 0 10*3/uL (ref 0.0–0.1)
Basophils Relative: 0 %
EOS PCT: 0 %
Eosinophils Absolute: 0 10*3/uL (ref 0.0–0.5)
HCT: 41 % (ref 36.0–46.0)
Hemoglobin: 13.3 g/dL (ref 12.0–15.0)
Immature Granulocytes: 0 %
Lymphocytes Relative: 25 %
Lymphs Abs: 2.7 10*3/uL (ref 0.7–4.0)
MCH: 30.8 pg (ref 26.0–34.0)
MCHC: 32.4 g/dL (ref 30.0–36.0)
MCV: 94.9 fL (ref 80.0–100.0)
Monocytes Absolute: 0.8 10*3/uL (ref 0.1–1.0)
Monocytes Relative: 7 %
Neutro Abs: 7.6 10*3/uL (ref 1.7–7.7)
Neutrophils Relative %: 68 %
Platelets: 222 10*3/uL (ref 150–400)
RBC: 4.32 MIL/uL (ref 3.87–5.11)
RDW: 12.7 % (ref 11.5–15.5)
WBC: 11.2 10*3/uL — ABNORMAL HIGH (ref 4.0–10.5)
nRBC: 0 % (ref 0.0–0.2)

## 2018-01-27 LAB — GLUCOSE, CAPILLARY
GLUCOSE-CAPILLARY: 63 mg/dL — AB (ref 70–99)
Glucose-Capillary: 84 mg/dL (ref 70–99)
Glucose-Capillary: 88 mg/dL (ref 70–99)
Glucose-Capillary: 97 mg/dL (ref 70–99)

## 2018-01-27 MED ORDER — PROMETHAZINE HCL 12.5 MG PO TABS: 12.5000 mg | ORAL_TABLET | Freq: Four times a day (QID) | ORAL | 0 refills | Status: DC | PRN

## 2018-01-27 MED FILL — PROMETHAZINE 12.5 MG TABLET: 12.5 | 5 days supply | Qty: 20 | Fill #0

## 2018-01-27 NOTE — Progress Notes (Signed)
Patient walking in hallway.  Stated she had a much better night.  Drinking fluids without pain or nausea.  Discharge written.

## 2018-01-27 NOTE — Progress Notes (Signed)
Discharge instructions given to pt and all questions were answered. Pt taken down via wheelchair and was picked up by her husband.  

## 2018-01-27 NOTE — Progress Notes (Addendum)
Hypoglycemic Event  CBG: 63  Treatment: Protein shake & popsicle  Symptoms: None  Follow-up CBG: Time:0028 CBG Result:84  Possible Reasons for Event: Decreased PO intake  Comments/MD notified:    Sallyann Kinnaird

## 2018-01-28 NOTE — Progress Notes (Signed)
Patient ID: Alexis Chase, female   DOB: October 19, 1970, 47 y.o.   MRN: [id number]   Progress Note: Metabolic and Bariatric Surgery Service   Chief Complaint/Subjective: Nausea resolved Feels better. Walked multiple times  Objective: Vital signs in last 24 hours:   Last BM Date: 01/24/18  Intake/Output from previous day: No intake/output data recorded. Intake/Output this shift: Total I/O In: 2290 [P.O.:290; I.V.:2000] Out: 1950 [Urine:1950]  Lungs: cta  Cardiovascular: reg  Abd: soft, min TTP, incisions c/d/i  Extremities:  No edema  Neuro: nonfocal. Alert.   Lab Results: CBC  Recent Labs    01/26/18 0506 01/27/18 0414  WBC 15.2* 11.2*  HGB 13.6 13.3  HCT 42.3 41.0  PLT 245 222   BMET Recent Labs    01/26/18 0506  NA 139  K 4.8  CL 105  CO2 23  GLUCOSE 119*  BUN 15  CREATININE 0.90  CALCIUM 9.3   PT/INR No results for input(s): LABPROT, INR in the last 72 hours. ABG No results for input(s): PHART, HCO3 in the last 72 hours.  Invalid input(s): PCO2, PO2  Studies/Results:  Anti-infectives: Anti-infectives (From admission, onward)   Start     Dose/Rate Route Frequency Ordered Stop   01/25/18 0845  cefoTEtan (CEFOTAN) 2 g in sodium chloride 0.9 % 100 mL IVPB     2 g 200 mL/hr over 30 Minutes Intravenous On call to O.R. 01/25/18 0842 01/25/18 1104      Medications: Scheduled Meds: Continuous Infusions: PRN Meds:.  Assessment/Plan: Patient Active Problem List   Diagnosis Date Noted  . Prediabetes 01/25/2018  . SUI (stress urinary incontinence, female) 01/25/2018  . Metabolic syndrome 40/98/1191  . Severe obesity ([place]) 01/25/2018  . Vitamin D deficiency 02/08/2017  . Morbid obesity ([place]), interested in gastric sleeve 02/07/2017  . Essential hypertension, on Norvasc 02/07/2017  . Hyperlipidemia, on statin 02/07/2017  . Dysthymia 02/07/2017  . History of breast cancer, ER +, HER2 -, grade 3, s/p chemo and radiation. 3 year anniversery  recently 02/07/2017   s/p Procedure(s): LAPAROSCOPIC GASTRIC SLEEVE RESECTION WITH HIATAL HERNIA REPAIR, UPPER ENDO AND ERAS PATHWAY 01/25/2018  Ok for dc PO improved Vitals/labs ok rediscussed dc instructions with pt and husband Disposition:  LOS: 2 days  The patient should be discharged from the hospital today  [name], MD [phone] [facility]

## 2018-01-29 ENCOUNTER — Other Ambulatory Visit: Payer: Self-pay | Admitting: *Deleted

## 2018-01-29 ENCOUNTER — Encounter: Payer: Self-pay | Admitting: *Deleted

## 2018-01-29 NOTE — Patient Outreach (Signed)
[facility]) Care Management  01/29/2018  Alexis Chase 01/03/1971 [id number]   Subjective: Telephone call to patient's home / mobile number, spoke with patient, and HIPAA verified.  Discussed [facility] Care Management UMR Transition of care follow up, patient voiced understanding, and is in agreement to follow up.   Patient states she is feeling pretty good, tolerating full liquid diet / protein shakes without difficulty, and has a follow up appointment with surgeon on 02/10/18.  Patient states she is able to manage self care and has assistance as needed.   States her husband who is the [employer],  is a physician,  and able to assist.  Patient voices understanding of medical diagnosis, surgery, and treatment plan. States she is accessing the following [facility] benefits: outpatient pharmacy, hospital indemnity (not chosen), she, and husband have family medical leave act [employer]) in place.  Patient states she does not have any education material, transition of care, care coordination, disease management, disease monitoring, transportation, community resource, or pharmacy needs at this time.  States she is very appreciative of the follow up and is in agreement to receive [facility] information.      Objective:Per KPN (Knowledge Performance Now, point of care tool) and chart review, patient hospitalized 01/25/18 - 01/27/18 for morbid obesity, status post [facility] on 01/25/18.   Patient also has a history of prediabetes, hyperlipidemia, hypertension, breast cancer, SUI (stress urinary incontinence), and Metabolic syndrome.        Assessment: Received UMR Transition of care referral on 01/26/18.   Transition of care follow up completed, no care management needs, and will proceed with case closure.       Plan: RNCM will send patient successful outreach letter, [facility] pamphlet, and magnet. RNCM will complete case closure  due to follow up completed / no care management needs.        Felder Lebeda H. Annia Friendly, BSN, [facility] Telephonic CM Phone: [phone] Fax: [phone]

## 2018-02-01 ENCOUNTER — Telehealth (HOSPITAL_COMMUNITY): Payer: Self-pay

## 2018-02-01 NOTE — Telephone Encounter (Signed)
Patient called to discuss post bariatric surgery follow up questions.  See below:   1.  Tell me about your pain and pain management?no pain since discharge  2.  Let's talk about fluid intake.  How much total fluid are you taking in?50 ounces per day  3.  How much protein have you taken in the last 2 days?30-45  4.  Have you had nausea?  Tell me about when have experienced nausea and what you did to help?no nausea since discharge  5.  Has the frequency or color changed with your urine?frequent urination  6.  Tell me what your incisions look like?look good  7.  Have you been passing gas? BM?pasing gas and bm  8.  If a problem or question were to arise who would you call?  Do you know contact numbers for [facility], [facility], and [facility]?aware of how to contact all services  9.  How has the walking going?walking regularly  10.  How are your vitamins and calcium going?  How are you taking them?tarted mvi and calcium no difficulty

## 2018-02-09 ENCOUNTER — Encounter: Payer: 59 | Attending: General Surgery | Admitting: Skilled Nursing Facility1

## 2018-02-09 DIAGNOSIS — E669 Obesity, unspecified: Secondary | ICD-10-CM | POA: Diagnosis not present

## 2018-02-10 ENCOUNTER — Encounter: Payer: Self-pay | Admitting: Skilled Nursing Facility1

## 2018-02-10 NOTE — Progress Notes (Signed)
Bariatric Class:  Appt start time: 1530 end time:  1630.  2 Week Post-Operative Nutrition Class  Patient was seen on  for [facility]ative [facility]   Surgery date:  Surgery type: sleeve Start weight at [facility]: 248.2 Weight today: 240.4  TANITA  BODY COMP RESULTS  02/09/2018   BMI (kg/m^2) 37.7   Fat Mass (lbs) 116.2   Fat Free Mass (lbs) 124.2   Total Body Water (lbs) 90.6   The following the learning objectives were met by the patient during this course:  Identifies Phase 3A (Soft, High Proteins) Dietary Goals and will begin from 2 weeks post-operatively to 2 months post-operatively  Identifies appropriate sources of fluids and proteins   States protein recommendations and appropriate sources post-operatively  Identifies the need for appropriate texture modifications, mastication, and bite sizes when consuming solids  Identifies appropriate multivitamin and calcium sources post-operatively  Describes the need for physical activity post-operatively and will follow MD recommendations  States when to call healthcare provider regarding medication questions or post-operative complications  Handouts given during class include:  Phase 3A: Soft, High Protein Diet Handout  Follow-Up Plan: Patient will follow-up at [facility] in 6 weeks for 2 month post-op nutrition visit for diet advancement per MD.

## 2018-02-12 ENCOUNTER — Ambulatory Visit: Payer: Self-pay | Admitting: Family Medicine

## 2018-02-12 DIAGNOSIS — C50919 Malignant neoplasm of unspecified site of unspecified female breast: Secondary | ICD-10-CM | POA: Diagnosis not present

## 2018-02-15 ENCOUNTER — Telehealth: Payer: Self-pay | Admitting: Skilled Nursing Facility1

## 2018-02-15 MED FILL — LETROZOLE 2.5 MG TABLET: 2.5 | 30 days supply | Qty: 30 | Fill #2

## 2018-02-15 NOTE — Telephone Encounter (Signed)
RD called pt to verify fluid intake once starting soft, solid proteins 2 week post-bariatric surgery.  ° °Daily Fluid intake: 64+ °Daily Protein intake: 60+ ° °Concerns/issues:  ° °None stated °

## 2018-03-08 MED FILL — ESCITALOPRAM 10 MG TABLET: 10 | 30 days supply | Qty: 30 | Fill #3

## 2018-03-08 MED FILL — AMLODIPINE BESYLATE 5 MG TA: 5 | 90 days supply | Qty: 90 | Fill #0

## 2018-03-11 MED FILL — LETROZOLE 2.5 MG TABLET: 2.5 | 30 days supply | Qty: 30 | Fill #3

## 2018-03-18 ENCOUNTER — Encounter: Payer: Self-pay | Admitting: Dietician

## 2018-03-18 ENCOUNTER — Encounter: Payer: 59 | Attending: General Surgery | Admitting: Dietician

## 2018-03-18 VITALS — Wt 231.8 lb

## 2018-03-18 DIAGNOSIS — E669 Obesity, unspecified: Secondary | ICD-10-CM | POA: Insufficient documentation

## 2018-03-18 NOTE — Patient Instructions (Signed)
Post Op Phase 4 Diet:  . Continue to aim for a minimum of 64 fluid ounces 7 days a week with at least 30 ounces being plain water . Eat non-starchy vegetables 2 times a day 7 days a week . Start out with soft cooked vegetables today and tomorrow; if tolerated, begin to eat raw vegetables including salads . Per meal/snack, eat 3 ounces of protein first then start on non-starchy vegetables; once you understand how much of your meal leads to satisfaction (and not full) while still eating 3 ounces of protein and non-starchy vegetables, you can eat them in any order (figure out how much you can eat at a time to get enough but not too much)  . Continue to aim for 30 minutes of physical activity at least 5 times a week   Also:   Look into apps that can help you keep track of food intake and set timers for when to take supplements, drink water, etc.   For non-meat proteins, try: eggs, seafood (fish, shrimp, etc.), beans, lentils, tofu, nutritional yeast, plant-based protein shakes, etc.

## 2018-03-18 NOTE — Progress Notes (Signed)
Bariatric Follow-Up Visit Medical Nutrition Therapy  Appt Start Time: 12:00pm End Time: 12:55pm  2 Months Post-Operative Sleeve Gastrectomy Surgery  Primary Concerns Today: weight loss rate, not tracking food/fluid intake, busy work/life balance   NUTRITION ASSESSMENT  Anthropometrics  Start weight at [facility]: 248.2 lbs (date: 09/07/2017) Today's weight: 231.8 lbs   Weight change: -8.6 lbs (since previous visit on: 02/09/2018)  TANITA  Body Composition Results  02/09/2018 03/18/2018   BMI (kg/m2) 37.7 37.4   Fat Mass (lbs) 116.2 114.6   Fat Free Mass (lbs) 124.2 117.2   Total Body Water (lbs) 90.6 85.4   Total Body Water (%) - 36.8   24-Hr Dietary Recall First Meal: protein shake (or a boiled egg) + decaf coffee w/ almond milk Snack: none  Second Meal: chicken salad (or grilled chicken nuggets) + unsweet tea Snack: none Third Meal: pork tenderloin + mashed potatoes (or protein shake)  Snack: none Beverages: decaf coffee + water + unsweet tea   Food & Nutrition Related Hx Dietary Hx: Pt states she is not drinking alcohol, caffeine, or carbonation. Pt states she does not want to eat meat. Pt states she does not eat much dairy. Pt states she has also eaten tofu, chicken soup, cheese + deli meat in addition to the foods listed in her dietary recall. Pt states she is seeing another healthcare provider who is helping her with preventative nutrition methods to treat/reduce the risk of developing cancer again. During today's appointment, RD helped pt incorporate these preferences (certain supplements, avoiding meats, drinking green tea, etc.) so that they also fit within the bariatric program nutrition recommendations. RD helped pt determine methods to drink more fluids, track food intake, learn non-meat protein options, and how to incorporate other supplements and recommended foods. Pt states she is overwhelmed with her workload which has prevented her from staying more focused on her diet.   Supplements: bariatric MVI + calcium   Estimated Daily Fluid Intake: <64 oz Estimated Daily Protein Intake: 60+ g  Physical Activity  Current average weekly physical activity: none throughout the week, barre on the weekends    Post-Op Goals Using straws: no Drinking while eating: no Chewing/swallowing difficulties: no Changes in vision: no Changes to mood/headaches: no Hair loss/changes to skin/nails: no Difficulty focusing/concentrating: no Sweating: no Dizziness/lightheadedness: no Palpitations: no  Carbonated/caffeinated beverages: no N/V/D/C/Gas: no Abdominal pain: no Dumping syndrome: no Last Lap-Band fill: N/A   NUTRITION DIAGNOSIS  Overweight/obesity (Faith-3.3) related to past poor dietary habits and physical inactivity as evidenced by patient w/ completed Sleeve Gastrectomy surgery following dietary guidelines for continued weight loss.   NUTRITION INTERVENTION Nutrition counseling (C-1) and education (E-2) to facilitate bariatric surgery goals, including: . Diet advancement to the next phase (phase IV) now including non-starchy vegetables . The importance of consuming adequate calories as well as certain nutrients daily due to the body's need for essential vitamins, minerals, and fats . The importance of daily physical activity and to reach a goal of at least 150 minutes of moderate to vigorous physical activity weekly (or as directed by their physician) due to benefits such as increased musculature and improved lab values  Handouts Provided Include   Phase IV: Protein + Non-Starchy Vegetables   Post-Op Resources: Apps and Websites   Bariatric Tips for Eating Out  Learning Style & Readiness for Change Teaching method utilized: Visual & Auditory  Demonstrated degree of understanding via: Teach Back  Barriers to learning/adherence to lifestyle change: Work/life balance  RD's Notes for Next  Visit . Assess progress made with goals set previously:  o Download an  app to track food/fluid intake and use timers for reminders o Choose non-meat proteins (requested per pt)  o Set out large 64 oz container each day, fill it with water, and divide it into smaller water bottles/cups to spread intake throughout the day   MONITORING & EVALUATION Dietary intake, weekly physical activity, and body weight in 4 months.  Next Steps Patient is to follow-up in 4 months for 6 month post-op class.

## 2018-03-23 ENCOUNTER — Ambulatory Visit: Payer: Self-pay | Admitting: Dietician

## 2018-03-28 NOTE — Progress Notes (Signed)
Alexis Chase is a 48 y.o. female is here for follow up.  History of Present Illness:   I acted as a Education administrator for Dr. Briscoe Deutscher. Anselmo Pickler, LPN  Cough  This is a new problem. Episode onset: Started on Saturday. The problem has been gradually worsening. Associated symptoms include nasal congestion, postnasal drip and a sore throat. Pertinent negatives include no chills, ear congestion, ear pain, fever or shortness of breath. Risk factors for lung disease include travel (Was in [place] last week for Business). Treatments tried: Mucinex. The treatment provided mild relief. There is no history of asthma, bronchitis or pneumonia.    Pt here for follow up on S/P Bariatric 01/25/2018. Pt has been feeling good. Pt trying to eat protein and doing protein drinks. Pt has graduated to vegetables last week. Tolerating well. Trying to drink more water.   There are no preventive care reminders to display for this patient.   Depression screen [facility] 2/9 02/10/2018 09/07/2017 08/07/2017  Decreased Interest 0 0 0  Down, Depressed, Hopeless 0 0 0  PHQ - 2 Score 0 0 0  Altered sleeping - - 0  Tired, decreased energy - - 0  Change in appetite - - 1  Feeling bad or failure about yourself  - - 1  Trouble concentrating - - 0  Moving slowly or fidgety/restless - - 0  Suicidal thoughts - - 0  PHQ-9 Score - - 2   PMHx, SurgHx, SocialHx, FamHx, Medications, and Allergies were reviewed in the Visit Navigator and updated as appropriate.   Patient Active Problem List   Diagnosis Date Noted  . 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 Redmond Pulling 03/29/2018  . Prediabetes 01/25/2018  . SUI (stress urinary incontinence, female) 01/25/2018  . Metabolic syndrome 34/74/2595  . Severe obesity ([place]) 01/25/2018  . Vitamin D deficiency 02/08/2017  . Morbid obesity ([place]) 02/07/2017  . Essential hypertension, on Norvasc 02/07/2017  . Hyperlipidemia, on statin 02/07/2017  . Dysthymia  02/07/2017  . History of breast cancer, ER +, HER2 -, grade 3, s/p chemo and radiation. 3 year anniversery recently 02/07/2017   Social History   Tobacco Use  . Smoking status: Former Smoker    Years: 5.00  . Smokeless tobacco: Never Used  . Tobacco comment: intermittently; socially   Substance Use Topics  . Alcohol use: Yes    Alcohol/week: 1.0 standard drinks    Types: 1 Glasses of wine per week    Comment: once a week   . Drug use: Never   Current Medications and Allergies   Current Outpatient Medications:  .  amLODipine (NORVASC) 5 MG tablet, Take 1 tablet (5 mg total) by mouth daily., Disp: 30 tablet, Rfl: 3 .  atorvastatin (LIPITOR) 20 MG tablet, Take 1 tablet (20 mg total) by mouth daily., Disp: 90 tablet, Rfl: 1 .  Cholecalciferol (VITAMIN D PO), Take 5,000 Units by mouth daily., Disp: , Rfl:  .  escitalopram (LEXAPRO) 10 MG tablet, Take 1 tablet (10 mg total) by mouth daily., Disp: 30 tablet, Rfl: 3 .  letrozole (FEMARA) 2.5 MG tablet, TAKE 1 TABLET BY MOUTH DAILY. (Patient taking differently: Take 2.5 mg by mouth daily. ), Disp: 30 tablet, Rfl: 3 .  Multiple Vitamin (MULTIVITAMIN) tablet, Take 1 tablet by mouth 2 (two) times daily. Nutra Essentials, Disp: , Rfl:  .  Multiple Vitamins-Minerals (BARIATRIC FUSION) CHEW, Chew 1 tablet by mouth 2 (two) times daily., Disp: , Rfl:  .  Omega-3  Fatty Acids (EPA PO), Take 4 capsules by mouth 2 (two) times daily., Disp: , Rfl:  .  traMADol (ULTRAM) 50 MG tablet, Take 1 tablet (50 mg total) by mouth every 6 (six) hours as needed for severe pain (pain)., Disp: 15 tablet, Rfl: 0 .  pantoprazole (PROTONIX) 40 MG tablet, Take 1 tablet (40 mg total) by mouth daily. (Patient not taking: Reported on 03/29/2018), Disp: 30 tablet, Rfl: 0 .  Probiotic Product (PROBIOTIC DAILY PO), Take 1 tablet by mouth daily., Disp: , Rfl:    Allergies  Allergen Reactions  . Acyclovir And Related    Review of Systems   Pertinent items are noted in the HPI.  Otherwise, a complete ROS is negative.  Vitals   Vitals:   03/29/18 1439  BP: 122/80  Pulse: 91  Temp: (!) 97.3 F (36.3 C)  TempSrc: Oral  SpO2: 95%  Weight: 231 lb 8 oz (105 kg)  Height: '5\' 7"'$  (1.702 m)     Body mass index is 36.26 kg/m.  Physical Exam   Physical Exam Vitals signs and nursing note reviewed.  Constitutional:      General: She is not in acute distress. HENT:     Head: Normocephalic and atraumatic.     Right Ear: Tympanic membrane normal.     Left Ear: Tympanic membrane normal.     Nose: Mucosal edema and rhinorrhea present.     Mouth/Throat:     Mouth: Mucous membranes are moist.  Eyes:     Pupils: Pupils are equal, round, and reactive to light.  Neck:     Musculoskeletal: Normal range of motion and neck supple.  Cardiovascular:     Rate and Rhythm: Normal rate and regular rhythm.     Heart sounds: Normal heart sounds.  Pulmonary:     Effort: Pulmonary effort is normal.     Breath sounds: Normal breath sounds.  Abdominal:     Palpations: Abdomen is soft.  Skin:    General: Skin is warm.  Neurological:     Mental Status: She is alert.  Psychiatric:        Behavior: Behavior normal.     Assessment and Plan   Tammey was seen today for follow up bariatric surgery and cough.  Diagnoses and all orders for this visit:  Pure hypercholesterolemia Comments: Will recheck labs at next visit to see if medication is still necessary.  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 Redmond Pulling Comments: Patient is doing very well.  She has follow-up with Dr. Redmond Pulling this week.  Morbid obesity ([name]) Comments: Patient continues to lose weight as expected.  Essential hypertension, on Norvasc Comments: We discussed the potential need for discontinuation of this medication.  She will monitor blood pressure at home.  Okay to stop if lowering.  Skin exam, screening for cancer Comments: Patient would like a referral to  dermatology for routine skin checks.  She does have a history of cancer. Orders: -     Ambulatory referral to Dermatology  Acute nasopharyngitis Comments: Patient has a cold today.  We discussed symptomatic care and red flags for calling.   . Orders and follow up as documented in [place], reviewed diet, exercise and weight control, cardiovascular risk and specific lipid/LDL goals reviewed, reviewed medications and side effects in detail.  . Reviewed expectations re: course of current medical issues. . Outlined signs and symptoms indicating need for more acute intervention. . Patient verbalized understanding and all questions were answered. Marland Kitchen  Patient received an After Visit Summary.  LPN served as Education administrator during this visit. History, Physical, and Plan performed by medical provider. The above documentation has been reviewed and is accurate and complete. Briscoe Deutscher, D.O.  Briscoe Deutscher, DO [place], [place] [facility] 04/04/2018

## 2018-03-29 ENCOUNTER — Ambulatory Visit (INDEPENDENT_AMBULATORY_CARE_PROVIDER_SITE_OTHER): Payer: 59 | Admitting: Family Medicine

## 2018-03-29 ENCOUNTER — Encounter: Payer: Self-pay | Admitting: Family Medicine

## 2018-03-29 VITALS — BP 122/80 | HR 91 | Temp 97.3°F | Ht 67.0 in | Wt 231.5 lb

## 2018-03-29 DIAGNOSIS — Z1283 Encounter for screening for malignant neoplasm of skin: Secondary | ICD-10-CM

## 2018-03-29 DIAGNOSIS — Z9884 Bariatric surgery status: Secondary | ICD-10-CM

## 2018-03-29 DIAGNOSIS — J00 Acute nasopharyngitis [common cold]: Secondary | ICD-10-CM

## 2018-03-29 DIAGNOSIS — E7800 Pure hypercholesterolemia, unspecified: Secondary | ICD-10-CM | POA: Diagnosis not present

## 2018-03-29 DIAGNOSIS — I10 Essential (primary) hypertension: Secondary | ICD-10-CM

## 2018-03-29 HISTORY — DX: Bariatric surgery status: Z98.84

## 2018-04-04 ENCOUNTER — Encounter: Payer: Self-pay | Admitting: Family Medicine

## 2018-04-15 ENCOUNTER — Other Ambulatory Visit: Payer: Self-pay | Admitting: Family Medicine

## 2018-04-15 DIAGNOSIS — Z853 Personal history of malignant neoplasm of breast: Secondary | ICD-10-CM

## 2018-04-15 MED FILL — LETROZOLE 2.5 MG TABLET: 2.5 | 30 days supply | Qty: 30 | Fill #0

## 2018-04-19 ENCOUNTER — Encounter: Payer: Self-pay | Admitting: Family Medicine

## 2018-04-20 NOTE — Telephone Encounter (Signed)
Printed and given to patient.

## 2018-04-29 ENCOUNTER — Other Ambulatory Visit: Payer: Self-pay | Admitting: Family Medicine

## 2018-04-29 DIAGNOSIS — F341 Dysthymic disorder: Secondary | ICD-10-CM

## 2018-04-29 DIAGNOSIS — I10 Essential (primary) hypertension: Secondary | ICD-10-CM

## 2018-04-29 MED FILL — ESCITALOPRAM 10 MG TABLET: 10 | 30 days supply | Qty: 30 | Fill #0

## 2018-04-29 MED FILL — ATORVASTATIN 20 MG TABLET: 20 | 90 days supply | Qty: 90 | Fill #1

## 2018-05-19 ENCOUNTER — Other Ambulatory Visit: Payer: Self-pay | Admitting: Family Medicine

## 2018-05-19 DIAGNOSIS — F341 Dysthymic disorder: Secondary | ICD-10-CM

## 2018-05-19 MED FILL — LETROZOLE 2.5 MG TABLET: 2.5 | 30 days supply | Qty: 30 | Fill #1

## 2018-05-19 MED FILL — AMLODIPINE BESYLATE 5 MG TA: 5 | 90 days supply | Qty: 90 | Fill #0

## 2018-05-19 MED FILL — ESCITALOPRAM 10 MG TABLET: 10 | 90 days supply | Qty: 90 | Fill #0

## 2018-05-26 ENCOUNTER — Encounter: Payer: Self-pay | Admitting: Family Medicine

## 2018-06-21 MED FILL — LETROZOLE 2.5 MG TABLET: 2.5 | 30 days supply | Qty: 30 | Fill #2

## 2018-06-21 MED FILL — ESCITALOPRAM 10 MG TABLET: 10 | 30 days supply | Qty: 30 | Fill #1

## 2018-07-02 DIAGNOSIS — C50919 Malignant neoplasm of unspecified site of unspecified female breast: Secondary | ICD-10-CM | POA: Diagnosis not present

## 2018-07-02 DIAGNOSIS — E559 Vitamin D deficiency, unspecified: Secondary | ICD-10-CM | POA: Diagnosis not present

## 2018-07-02 DIAGNOSIS — Z79811 Long term (current) use of aromatase inhibitors: Secondary | ICD-10-CM | POA: Diagnosis not present

## 2018-07-05 DIAGNOSIS — C50919 Malignant neoplasm of unspecified site of unspecified female breast: Secondary | ICD-10-CM | POA: Diagnosis not present

## 2018-07-05 DIAGNOSIS — Z79811 Long term (current) use of aromatase inhibitors: Secondary | ICD-10-CM | POA: Diagnosis not present

## 2018-07-06 ENCOUNTER — Ambulatory Visit: Payer: Self-pay

## 2018-07-27 MED FILL — LETROZOLE 2.5 MG TABLET: 2.5 | 30 days supply | Qty: 30 | Fill #3

## 2018-08-19 ENCOUNTER — Telehealth: Payer: Self-pay

## 2018-08-19 NOTE — Telephone Encounter (Signed)
Copied from [place] [id number]. Topic: Appointment Scheduling - Scheduling Inquiry for Clinic >> Aug 19, 2018 11:13 AM Rayann Heman wrote: Reason for CRM: pt called and stated that she is requesting follow about dexa scan results. Pt is also needing MMR. Please advise

## 2018-08-19 NOTE — Telephone Encounter (Signed)
Called patient app made to talk about dex report in the care every where and study on some statins being better for people with history cancer. I have made her app for second MMR as well

## 2018-08-19 NOTE — Progress Notes (Signed)
Virtual Visit via Video   Due to the COVID-19 pandemic, this visit was completed with telemedicine (audio/video) technology to reduce patient and provider exposure as well as to preserve personal protective equipment.   I connected with Alexis Chase by a video enabled telemedicine application and verified that I am speaking with the correct person using two identifiers. Location patient: Home Location provider: [facility], Office Persons participating in the virtual visit: Alexis Chase, Briscoe Deutscher, DO [name], CMA acting as scribe for Dr. Briscoe Deutscher.   I discussed the limitations of evaluation and management by telemedicine and the availability of in person appointments. The patient expressed understanding and agreed to proceed.  Care Team   Patient Care Team: Briscoe Deutscher, DO as PCP - General (Family Medicine) Buford Dresser, MD as PCP - Cardiology (Cardiology)  Subjective:   HPI: Patient was instructed by oncologist to see if you would be willing to start her on Prolia based on the Dexa scan below. She also has questions about the statin that she is on. Read study that some options are better for patients with history of cancer. She has emailed copy of study and I have printed off for review.  Imaging Results NM Bone Scan Whole Body (07/02/2018 4:20 PM EDT) NM Bone Scan Whole Body (07/02/2018 4:20 PM EDT)  Specimen     NM Bone Scan Whole Body (07/02/2018 4:20 PM EDT)  Impressions Performed At  No evidence of acute osseous abnormality or  metastatic disease.  PACS    NM Bone Scan Whole Body (07/02/2018 4:20 PM EDT)  Narrative Performed At  CLINICAL DATA: Breast cancer    COMPARISON: 06/26/2017.    TECHNIQUE: 25.10 mCi of Technetium 76mlabeled MDP was  administered IV and 3 hour delayed whole body planar imaging was  performed. Additional static imaging of the ribs, pelvis, head  and neck also performed.    FINDINGS: There is  normal marrow activity with no abnormal foci  of radiotracer activity to suggest metastasis or other acute  osseous abnormality. Increased activity in the distal right  clavicle is similar to prior examination. Activity in the level  of the shoulders, knees and feet also similar to prior exam.    Expected background activity within the kidneys and bladder.    PACS    NM Bone Scan Whole Body (07/02/2018 4:20 PM EDT)  Procedure Note  Interface, External Ris In - 07/02/2018 4:34 PM EDT  CLINICAL DATA: Breast cancer  COMPARISON: 06/26/2017.  TECHNIQUE: 25.10 mCi of Technetium 947mabeled MDP was administered IV and 3 hour delayed whole body planar imaging was performed. Additional static imaging of the ribs, pelvis, head and neck also performed.  FINDINGS: There is normal marrow activity with no abnormal foci of radiotracer activity to suggest metastasis or other acute osseous abnormality. Increased activity in the distal right clavicle is similar to prior examination. Activity in the level of the shoulders, knees and feet also similar to prior exam.  Expected background activity within the kidneys and bladder.  IMPRESSION:  No evidence of acute osseous abnormality or metastatic disease.    Review of Systems  Constitutional: Negative for chills, fever, malaise/fatigue and weight loss.  Respiratory: Negative for cough, shortness of breath and wheezing.   Cardiovascular: Negative for chest pain, palpitations and leg swelling.  Gastrointestinal: Negative for abdominal pain, constipation, diarrhea, nausea and vomiting.  Genitourinary: Negative for dysuria and urgency.  Musculoskeletal: Negative for joint pain and myalgias.  Skin: Negative  for rash.  Neurological: Negative for dizziness and headaches.  Psychiatric/Behavioral: Negative for depression, substance abuse and suicidal ideas. The patient is not nervous/anxious.     Patient Active Problem List   Diagnosis Date  Noted  . Use of letrozole (Femara) 08/20/2018  . Decreased bone mass 08/20/2018  . 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 Redmond Pulling 03/29/2018  . Prediabetes 01/25/2018  . SUI (stress urinary incontinence, female) 01/25/2018  . Metabolic syndrome 98/33/8250  . Severe obesity ([place]) 01/25/2018  . Vitamin D deficiency 02/08/2017  . Morbid obesity ([place]) 02/07/2017  . Essential hypertension, on Norvasc 02/07/2017  . Hyperlipidemia, on statin 02/07/2017  . Dysthymia 02/07/2017  . History of breast cancer, ER +, HER2 -, grade 3, s/p chemo and radiation. 3 year anniversery recently 02/07/2017    Social History   Tobacco Use  . Smoking status: Former Smoker    Years: 5.00  . Smokeless tobacco: Never Used  . Tobacco comment: intermittently; socially   Substance Use Topics  . Alcohol use: Yes    Alcohol/week: 1.0 standard drinks    Types: 1 Glasses of wine per week    Comment: once a week     Current Outpatient Medications:  .  amLODipine (NORVASC) 5 MG tablet, TAKE 1 TABLET BY MOUTH DAILY., Disp: 30 tablet, Rfl: 3 .  atorvastatin (LIPITOR) 20 MG tablet, Take 1 tablet (20 mg total) by mouth daily., Disp: 90 tablet, Rfl: 1 .  Calcium 500-100 MG-UNIT CHEW, Chew 3 each by mouth daily., Disp: 270 tablet, Rfl: 1 .  escitalopram (LEXAPRO) 10 MG tablet, TAKE 1 TABLET BY MOUTH ONCE DAILY, Disp: 90 tablet, Rfl: 1 .  letrozole (FEMARA) 2.5 MG tablet, TAKE 1 TABLET BY MOUTH DAILY., Disp: 30 tablet, Rfl: 3 .  Multiple Vitamin (MULTIVITAMIN) tablet, Take 1 tablet by mouth 2 (two) times daily. Nutra Essentials, Disp: , Rfl:  .  Multiple Vitamins-Minerals (BARIATRIC FUSION) CHEW, Chew 1 tablet by mouth 2 (two) times daily., Disp: 90 tablet, Rfl: 1 .  Omega-3 Fatty Acids (EPA PO), Take 4 capsules by mouth 2 (two) times daily., Disp: , Rfl:  .  Probiotic Product (PROBIOTIC DAILY PO), Take 1 tablet by mouth daily., Disp: , Rfl:  .  Cholecalciferol (VITAMIN D) 125 MCG (5000  UT) CAPS, Take 5,000 Units by mouth daily., Disp: 90 capsule, Rfl: 1  Allergies  Allergen Reactions  . Acyclovir And Related    Objective:   VITALS: Per patient if applicable, see vitals. GENERAL: Alert, appears well and in no acute distress. HEENT: Atraumatic, conjunctiva clear, no obvious abnormalities on inspection of external nose and ears. NECK: Normal movements of the head and neck. CARDIOPULMONARY: No increased WOB. Speaking in clear sentences. I:E ratio WNL.  MS: Moves all visible extremities without noticeable abnormality. PSYCH: Pleasant and cooperative, well-groomed. Speech normal rate and rhythm. Affect is appropriate. Insight and judgement are appropriate. Attention is focused, linear, and appropriate.  NEURO: CN grossly intact. Oriented as arrived to appointment on time with no prompting. Moves both UE equally.  SKIN: No obvious lesions, wounds, erythema, or cyanosis noted on face or hands.  Depression screen [facility] 2/9 08/20/2018 02/10/2018 09/07/2017  Decreased Interest 0 0 0  Down, Depressed, Hopeless 0 0 0  PHQ - 2 Score 0 0 0  Altered sleeping 0 - -  Tired, decreased energy 0 - -  Change in appetite 0 - -  Feeling bad or failure about yourself  0 - -  Trouble concentrating  0 - -  Moving slowly or fidgety/restless 0 - -  Suicidal thoughts 0 - -  PHQ-9 Score 0 - -  Difficult doing work/chores Not difficult at all - -    Assessment and Plan:   Keiasia was seen today for follow-up.  Diagnoses and all orders for this visit:  Pure hypercholesterolemia Comments: Patient had questions re: decreased risk of breast cancer with piophilic statins. Reviewed studies. Lipitor is lipophilic, along with Zocor, so will continue. 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 Redmond Pulling Comments: Doing well. Walking 3 miles/day. Working to get protein at goal. Staying hydrated. Taking MVM. Orders: -     Cholecalciferol  (VITAMIN D) 125 MCG (5000 UT) CAPS; Take 5,000 Units by mouth daily. -     Multiple Vitamins-Minerals (BARIATRIC FUSION) CHEW; Chew 1 tablet by mouth 2 (two) times daily. -     Calcium 500-100 MG-UNIT CHEW; Chew 3 each by mouth daily. -     CBC with Differential/Platelet -     Comprehensive metabolic panel -     TSH  Prediabetes Comments: Labs ordered.  Vitamin D deficiency -     VITAMIN D 25 Hydroxy (Vit-D Deficiency, Fractures)  B12 deficiency -     Vitamin B12  Decreased bone mass Comments: High risk of osteoporosis, increased rate of bone loss. Risk factors: Femara and VSG. Prolia recommended. Will order.   Use of letrozole (Femara)  History of breast cancer, ER +, HER2 -, grade 3, s/p chemo and radiation. 3 year anniversery recently    . COVID-19 Education: The signs and symptoms of COVID-19 were discussed with the patient and how to seek care for testing if needed. The importance of social distancing was discussed today. . Reviewed expectations re: course of current medical issues. . Discussed self-management of symptoms. . Outlined signs and symptoms indicating need for more acute intervention. . Patient verbalized understanding and all questions were answered. Marland Kitchen Health Maintenance issues including appropriate healthy diet, exercise, and smoking avoidance were discussed with patient. . See orders for this visit as documented in the electronic medical record.  Briscoe Deutscher, DO  Records requested if needed. Time spent: 25 minutes, of which >50% was spent in obtaining information about her symptoms, reviewing her previous labs, evaluations, and treatments, counseling her about her condition (please see the discussed topics above), and developing a plan to further investigate it; she had a number of questions which I addressed.

## 2018-08-20 ENCOUNTER — Ambulatory Visit (INDEPENDENT_AMBULATORY_CARE_PROVIDER_SITE_OTHER): Payer: 59 | Admitting: Family Medicine

## 2018-08-20 ENCOUNTER — Other Ambulatory Visit: Payer: Self-pay

## 2018-08-20 ENCOUNTER — Encounter: Payer: Self-pay | Admitting: Family Medicine

## 2018-08-20 VITALS — Ht 67.0 in | Wt 220.0 lb

## 2018-08-20 DIAGNOSIS — M8580 Other specified disorders of bone density and structure, unspecified site: Secondary | ICD-10-CM

## 2018-08-20 DIAGNOSIS — Z9884 Bariatric surgery status: Secondary | ICD-10-CM

## 2018-08-20 DIAGNOSIS — E559 Vitamin D deficiency, unspecified: Secondary | ICD-10-CM

## 2018-08-20 DIAGNOSIS — Z853 Personal history of malignant neoplasm of breast: Secondary | ICD-10-CM | POA: Diagnosis not present

## 2018-08-20 DIAGNOSIS — R7303 Prediabetes: Secondary | ICD-10-CM | POA: Diagnosis not present

## 2018-08-20 DIAGNOSIS — Z79811 Long term (current) use of aromatase inhibitors: Secondary | ICD-10-CM | POA: Diagnosis not present

## 2018-08-20 DIAGNOSIS — E538 Deficiency of other specified B group vitamins: Secondary | ICD-10-CM

## 2018-08-20 DIAGNOSIS — E7800 Pure hypercholesterolemia, unspecified: Secondary | ICD-10-CM

## 2018-08-20 HISTORY — DX: Long term (current) use of aromatase inhibitors: Z79.811

## 2018-08-20 HISTORY — DX: Other specified disorders of bone density and structure, unspecified site: M85.80

## 2018-08-20 MED ORDER — CALCIUM 500-100 MG-UNIT PO CHEW: 3.0000 | CHEWABLE_TABLET | Freq: Every day | ORAL | 1 refills | Status: DC

## 2018-08-20 MED ORDER — VITAMIN D 125 MCG (5000 UT) PO CAPS: 5000.0000 [IU] | ORAL_CAPSULE | Freq: Every day | ORAL | 1 refills | Status: DC

## 2018-08-20 MED ORDER — BARIATRIC FUSION PO CHEW: 1.0000 | CHEWABLE_TABLET | Freq: Two times a day (BID) | ORAL | 1 refills | Status: DC

## 2018-08-20 MED FILL — VITAMIN D3 5,000 UNIT CAP: 125 MCG | 90 days supply | Qty: 90 | Fill #0

## 2018-08-20 NOTE — Addendum Note (Signed)
Addended by: Francella Solian on: 08/20/2018 02:06 PM   Modules accepted: Orders

## 2018-08-23 ENCOUNTER — Other Ambulatory Visit: Payer: 59

## 2018-08-24 ENCOUNTER — Other Ambulatory Visit: Payer: Self-pay

## 2018-08-24 ENCOUNTER — Other Ambulatory Visit (INDEPENDENT_AMBULATORY_CARE_PROVIDER_SITE_OTHER): Payer: 59

## 2018-08-24 ENCOUNTER — Ambulatory Visit (INDEPENDENT_AMBULATORY_CARE_PROVIDER_SITE_OTHER): Payer: 59

## 2018-08-24 DIAGNOSIS — E7800 Pure hypercholesterolemia, unspecified: Secondary | ICD-10-CM

## 2018-08-24 DIAGNOSIS — E538 Deficiency of other specified B group vitamins: Secondary | ICD-10-CM

## 2018-08-24 DIAGNOSIS — E559 Vitamin D deficiency, unspecified: Secondary | ICD-10-CM

## 2018-08-24 DIAGNOSIS — R7303 Prediabetes: Secondary | ICD-10-CM | POA: Diagnosis not present

## 2018-08-24 DIAGNOSIS — Z9884 Bariatric surgery status: Secondary | ICD-10-CM | POA: Diagnosis not present

## 2018-08-24 DIAGNOSIS — Z23 Encounter for immunization: Secondary | ICD-10-CM | POA: Diagnosis not present

## 2018-08-24 LAB — COMPREHENSIVE METABOLIC PANEL
ALT: 17 U/L (ref 0–35)
AST: 18 U/L (ref 0–37)
Albumin: 4.1 g/dL (ref 3.5–5.2)
Alkaline Phosphatase: 106 U/L (ref 39–117)
BUN: 14 mg/dL (ref 6–23)
CO2: 28 mEq/L (ref 19–32)
Calcium: 9.3 mg/dL (ref 8.4–10.5)
Chloride: 105 mEq/L (ref 96–112)
Creatinine, Ser: 0.83 mg/dL (ref 0.40–1.20)
GFR: 72.98 mL/min (ref >60.00)
Glucose, Bld: 77 mg/dL (ref 70–99)
Potassium: 4.3 mEq/L (ref 3.5–5.1)
Sodium: 142 mEq/L (ref 135–145)
Total Bilirubin: 0.5 mg/dL (ref 0.2–1.2)
Total Protein: 6.5 g/dL (ref 6.0–8.3)

## 2018-08-24 LAB — LIPID PANEL
Cholesterol: 156 mg/dL (ref 0–200)
HDL: 49.9 mg/dL (ref >39.00)
LDL Cholesterol: 72 mg/dL (ref 0–99)
NonHDL: 106.15
Total CHOL/HDL Ratio: 3
Triglycerides: 171 mg/dL — ABNORMAL HIGH (ref 0.0–149.0)
VLDL: 34.2 mg/dL (ref 0.0–40.0)

## 2018-08-24 LAB — CBC WITH DIFFERENTIAL/PLATELET
Basophils Absolute: 0 10*3/uL (ref 0.0–0.1)
Basophils Relative: 0.8 % (ref 0.0–3.0)
Eosinophils Absolute: 0.2 10*3/uL (ref 0.0–0.7)
Eosinophils Relative: 3.4 % (ref 0.0–5.0)
HCT: 39.1 % (ref 36.0–46.0)
Hemoglobin: 13.4 g/dL (ref 12.0–15.0)
Lymphocytes Relative: 41.5 % (ref 12.0–46.0)
Lymphs Abs: 1.8 10*3/uL (ref 0.7–4.0)
MCHC: 34.2 g/dL (ref 30.0–36.0)
MCV: 95 fl (ref 78.0–100.0)
Monocytes Absolute: 0.3 10*3/uL (ref 0.1–1.0)
Monocytes Relative: 6.5 % (ref 3.0–12.0)
Neutro Abs: 2.1 10*3/uL (ref 1.4–7.7)
Neutrophils Relative %: 47.8 % (ref 43.0–77.0)
Platelets: 209 10*3/uL (ref 150.0–400.0)
RBC: 4.11 Mil/uL (ref 3.87–5.11)
RDW: 12.8 % (ref 11.5–15.5)
WBC: 4.4 10*3/uL (ref 4.0–10.5)

## 2018-08-24 LAB — TSH: TSH: 1.47 u[IU]/mL (ref 0.35–4.50)

## 2018-08-24 LAB — VITAMIN D 25 HYDROXY (VIT D DEFICIENCY, FRACTURES): VITD: 88.27 ng/mL (ref 30.00–100.00)

## 2018-08-24 LAB — VITAMIN B12: Vitamin B-12: 406 pg/mL (ref 211–911)

## 2018-08-24 NOTE — Addendum Note (Signed)
Addended by: Francis Dowse T on: 08/24/2018 09:10 AM   Modules accepted: Orders

## 2018-08-25 LAB — HEMOGLOBIN A1C: Hgb A1c MFr Bld: 5.5 % (ref 4.6–6.5)

## 2018-08-26 ENCOUNTER — Telehealth: Payer: Self-pay

## 2018-08-26 NOTE — Telephone Encounter (Signed)
Insurance verification for Alexis Chase is in process.

## 2018-08-31 ENCOUNTER — Other Ambulatory Visit: Payer: Self-pay | Admitting: Family Medicine

## 2018-08-31 ENCOUNTER — Encounter: Payer: Self-pay | Admitting: Family Medicine

## 2018-08-31 DIAGNOSIS — Z853 Personal history of malignant neoplasm of breast: Secondary | ICD-10-CM

## 2018-09-01 MED ORDER — LETROZOLE 2.5 MG PO TABS: 2.5000 mg | ORAL_TABLET | Freq: Every day | ORAL | 3 refills | Status: DC

## 2018-09-01 MED FILL — LETROZOLE 2.5 MG TABLET: 2.5 | 30 days supply | Qty: 30 | Fill #0

## 2018-09-17 DIAGNOSIS — D2262 Melanocytic nevi of left upper limb, including shoulder: Secondary | ICD-10-CM | POA: Diagnosis not present

## 2018-09-17 DIAGNOSIS — D225 Melanocytic nevi of trunk: Secondary | ICD-10-CM | POA: Diagnosis not present

## 2018-09-17 DIAGNOSIS — L814 Other melanin hyperpigmentation: Secondary | ICD-10-CM | POA: Diagnosis not present

## 2018-09-17 DIAGNOSIS — D1801 Hemangioma of skin and subcutaneous tissue: Secondary | ICD-10-CM | POA: Diagnosis not present

## 2018-09-17 DIAGNOSIS — D2271 Melanocytic nevi of right lower limb, including hip: Secondary | ICD-10-CM | POA: Diagnosis not present

## 2018-09-17 DIAGNOSIS — D2261 Melanocytic nevi of right upper limb, including shoulder: Secondary | ICD-10-CM | POA: Diagnosis not present

## 2018-09-21 ENCOUNTER — Ambulatory Visit (INDEPENDENT_AMBULATORY_CARE_PROVIDER_SITE_OTHER): Payer: 59 | Admitting: Cardiology

## 2018-09-21 ENCOUNTER — Other Ambulatory Visit: Payer: Self-pay

## 2018-09-21 ENCOUNTER — Telehealth: Payer: Self-pay | Admitting: Cardiology

## 2018-09-21 ENCOUNTER — Encounter: Payer: Self-pay | Admitting: Cardiology

## 2018-09-21 VITALS — BP 112/78 | HR 73 | Temp 97.3°F | Ht 67.0 in | Wt 224.0 lb

## 2018-09-21 DIAGNOSIS — Z853 Personal history of malignant neoplasm of breast: Secondary | ICD-10-CM | POA: Diagnosis not present

## 2018-09-21 DIAGNOSIS — E7800 Pure hypercholesterolemia, unspecified: Secondary | ICD-10-CM

## 2018-09-21 DIAGNOSIS — E8881 Metabolic syndrome: Secondary | ICD-10-CM | POA: Diagnosis not present

## 2018-09-21 DIAGNOSIS — Z9884 Bariatric surgery status: Secondary | ICD-10-CM

## 2018-09-21 DIAGNOSIS — I10 Essential (primary) hypertension: Secondary | ICD-10-CM | POA: Diagnosis not present

## 2018-09-21 NOTE — Progress Notes (Signed)
Cardiology Office Note:    Date:  09/21/2018   ID:  Alexis Chase, DOB February 27, 1970, MRN [id number]  PCP:  Briscoe Deutscher, DO  Cardiologist:  Buford Dresser, MD PhD  Referring MD: Briscoe Deutscher, DO   CC: Follow up  History of Present Illness:    Alexis Chase is a 48 y.o. female with a hx of breast cancer in remission, obesity, hypertension who is seen for follow up today. She has a past medical history of chemotherapy with adriamycin, HTN, and HLD, as well as a remote history of tobacco use, as risk factors. She recently underwent bariatric surgery in 01/2018.  Breast cancer history: in 2015, L breast cancer treated with neoadjuvant chemotherapy followed by lumpectomy and radiation, then chemotherapy. Follows up in [place], [place] with oncology.   Today: Doing well post bariatric surgery. Has lost 25-30 lbs since 01/2018. Has plateaued with weight loss, trying to increase exercise and improve diet. She brings with her a journal article re: lipophilic statins and breast cancer risk reduction, which we discussed in brief and I will review more in depth after our visit. Discussed current guidelines/recommendations re: primary prevention medical therapy. Also reviewed current indications for vascepa. 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  Denies chest pain, shortness of breath at rest or with normal exertion. No PND, orthopnea, LE edema. No syncope or palpitations.   Past Medical History:  Diagnosis Date  . Anemia    resolved   . Breast cancer ([place]) 10/14/2013  . Closed displaced fracture of acromial end of right clavicle 03/09/2015  . Dysthymia 02/07/2017  . Essential hypertension 02/07/2017  . History of breast cancer 02/07/2017   Left Breast   . PCOS (polycystic ovarian syndrome)    per patient    . Personal history of radiation therapy    and chemo d/t l breast CA  . Pure hypercholesterolemia 02/07/2017    Past Surgical History:   Procedure Laterality Date  . CESAREAN SECTION    . CHOLECYSTECTOMY    . HERNIA REPAIR    . LAPAROSCOPIC GASTRIC SLEEVE RESECTION WITH HIATAL HERNIA REPAIR N/A 01/25/2018   Procedure: LAPAROSCOPIC GASTRIC SLEEVE RESECTION WITH HIATAL HERNIA REPAIR, UPPER ENDO AND ERAS PATHWAY;  Surgeon: [name], MD;  Location: [facility];  Service: General;  Laterality: N/A;  . PORTACATH PLACEMENT    . TONSILLECTOMY    . TOTAL ABDOMINAL HYSTERECTOMY W/ BILATERAL SALPINGOOPHORECTOMY      Current Medications: Current Outpatient Medications on File Prior to Visit  Medication Sig  . amLODipine (NORVASC) 5 MG tablet TAKE 1 TABLET BY MOUTH DAILY.  Marland Kitchen atorvastatin (LIPITOR) 20 MG tablet Take 1 tablet (20 mg total) by mouth daily.  . Calcium 500-100 MG-UNIT CHEW Chew 3 each by mouth daily.  . Cholecalciferol (VITAMIN D) 125 MCG (5000 UT) CAPS Take 5,000 Units by mouth daily.  Marland Kitchen escitalopram (LEXAPRO) 10 MG tablet TAKE 1 TABLET BY MOUTH ONCE DAILY  . letrozole (FEMARA) 2.5 MG tablet Take 1 tablet (2.5 mg total) by mouth daily.  . Multiple Vitamin (MULTIVITAMIN) tablet Take 1 tablet by mouth 2 (two) times daily. Sales executive  . Multiple Vitamins-Minerals (BARIATRIC FUSION) CHEW Chew 1 tablet by mouth 2 (two) times daily.  . Omega-3 Fatty Acids (EPA PO) Take 4 capsules by mouth 2 (two) times daily.  . Probiotic Product (PROBIOTIC DAILY PO) Take 1 tablet by mouth daily.   No current facility-administered medications on file prior to visit.  Allergies:   Acyclovir and related   Social History   Socioeconomic History  . Marital status: Married    Spouse name: Not on file  . Number of children: Not on file  . Years of education: Not on file  . Highest education level: Not on file  Occupational History  . Not on file  Social Needs  . Financial resource strain: Not on file  . Food insecurity    Worry: Not on file    Inability: Not on file  . Transportation needs    Medical: Not on file     Non-medical: Not on file  Tobacco Use  . Smoking status: Former Smoker    Years: 5.00  . Smokeless tobacco: Never Used  . Tobacco comment: intermittently; socially   Substance and Sexual Activity  . Alcohol use: Yes    Alcohol/week: 1.0 standard drinks    Types: 1 Glasses of wine per week    Comment: once a week   . Drug use: Never  . Sexual activity: Not on file  Lifestyle  . Physical activity    Days per week: Not on file    Minutes per session: Not on file  . Stress: Not on file  Relationships  . Social Herbalist on phone: Not on file    Gets together: Not on file    Attends religious service: Not on file    Active member of club or organization: Not on file    Attends meetings of clubs or organizations: Not on file    Relationship status: Not on file  Other Topics Concern  . Not on file  Social History Narrative  . Not on file   husband is OB/Gyn  Family History: The patient's family history includes Alcohol abuse in her maternal grandmother; Arthritis in her maternal grandmother; COPD in her father and maternal grandmother; Diabetes in her father and paternal grandmother; Heart disease in her father and paternal grandmother; High Cholesterol in her mother; High blood pressure in her father; Hypertension in her paternal grandmother.  ROS:   Please see the history of present illness.  Additional pertinent ROS: Constitutional: Negative for chills, fever, night sweats, unintentional weight loss  HENT: Negative for ear pain and hearing loss.   Eyes: Negative for loss of vision and eye pain.  Respiratory: Negative for cough, sputum, wheezing.   Cardiovascular: See HPI. Gastrointestinal: Negative for abdominal pain, melena, and hematochezia.  Genitourinary: Negative for dysuria and hematuria.  Musculoskeletal: Negative for falls and myalgias.  Skin: Negative for itching and rash.  Neurological: Negative for focal weakness, focal sensory changes and loss of  consciousness.  Endo/Heme/Allergies: Does not bruise/bleed easily.   EKGs/Labs/Other Studies Reviewed:    The following studies were reviewed today:  Echo 10/09/17 Left ventricle: The cavity size was normal. Wall thickness was   increased in a pattern of mild LVH. Systolic function was normal.   The estimated ejection fraction was in the range of 60% to 65%.   Wall motion was normal; there were no regional wall motion   abnormalities. Left ventricular diastolic function parameters   were normal. GLS: -20.1%   Care Everywhere echo from 08/03/2014 Final Impressions: 1. There are no significant chamber enlargements noted. 2. Global left ventricular wall motion and contractility are within normal limits. 3. The estimated ejection fraction is (+/-) 65.74% by 2D.  4. Normal left ventricular diastolic filling is observed. 5. The right ventricular systolic function is within normal limits. 6.  All four cardiac valves appear normal in structure and function. 7. The inferior vena cava appearance suggests normal right ventricle filling pressures. 8. There is no pericardial effusion.  Echo limited 08/03/2015 Final Impressions: 1. This is a limited echo to reassess EF; patient has history of chemotherapy. 2. The left ventricular chamber size is normal. 3. Global left ventricular wall motion and contractility are within normal limits. The estimated ejection fraction is 60-65%. 4. There is no pericardial effusion. 5. The inferior vena cava appears normal in size; and there is a greater than 50% respiratory change in the inferior vena cava dimension. 6. There is a greater than 50% respiratory change in the inferior vena cava dimension.  EKG:  EKG is ordered today.  The ekg ordered today demonstrates normal sinus rhythm.  Recent Labs: 08/24/2018: ALT 17; BUN 14; Creatinine, Ser 0.83; Hemoglobin 13.4; Platelets 209.0; Potassium 4.3; Sodium 142; TSH 1.47  Recent Lipid Panel    Component Value  Date/Time   CHOL 156 08/24/2018 0903   TRIG 171.0 (H) 08/24/2018 0903   HDL 49.90 08/24/2018 0903   CHOLHDL 3 08/24/2018 0903   VLDL 34.2 08/24/2018 0903   LDLCALC 72 08/24/2018 0903   LDLCALC 81 02/06/2017 1522    Physical Exam:    VS:  BP 112/78   Pulse 73   Temp (!) 97.3 F (36.3 C)   Ht '5\' 7"'$  (1.702 m)   Wt 224 lb (101.6 kg)   BMI 35.08 kg/m     Wt Readings from Last 3 Encounters:  09/21/18 224 lb (101.6 kg)  08/20/18 220 lb (99.8 kg)  03/29/18 231 lb 8 oz (105 kg)    GEN: Well nourished, well developed in no acute distress HEENT: Normal NECK: No JVD; No carotid bruits LYMPHATICS: No lymphadenopathy CARDIAC: regular rhythm, normal S1 and S2, no murmurs, rubs, gallops. Radial and DP pulses 2+ bilaterally. RESPIRATORY:  Clear to auscultation without rales, wheezing or rhonchi  ABDOMEN: Soft, non-tender, non-distended MUSCULOSKELETAL:  No edema; No deformity  SKIN: Warm and dry NEUROLOGIC:  Alert and oriented x 3 PSYCHIATRIC:  Normal affect   ASSESSMENT:    1. Essential hypertension, on Norvasc   2. History of breast cancer, ER +, HER2 -, grade 3, s/p chemo and radiation. 3 year anniversery recently   3. Severe obesity ([place])   4. 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 Redmond Pulling   5. Pure hypercholesterolemia   6. Metabolic syndrome    PLAN:    Obesity and metabolic syndrome, now s/p sleeve gastrectomy 01/2018: Losing weight, though plateaued.  -overall recovered well -see below re: HTN and lipids  Hypertension: well controlled today. We discussed that it is not abnormal to need to cut back on medications, especially antihypertensives, with weight loss. -follow up with Dr. Juleen China, may be able to reduce/stop amlodipine with weight loss in the future  Hyperlipidemia: we discussed a journal article that she brought re: statin recommendations with breast cancer history. I will review this and reach back out to her. The crux of the  study was that in limited observational data, lipophilic statins may be associated with reduced risk of breast cancer. The mechanism of this is unclear, but I did reassure her that her current statin (atorvastatin) has lipophilic properties.  History of breast cancer, with radiation and adriamycin:  -doing well, echo with normal strain measurements 09/2017  Plan for follow up: 1 year or sooner as needed  TIME SPENT WITH PATIENT: 25 minutes of direct  patient care. More than 50% of that time was spent on coordination of care and counseling regarding guidelines, medication review, and counseling.  Buford Dresser, MD, PhD [place]  [facility]   Medication Adjustments/Labs and Tests Ordered: Current medicines are reviewed at length with the patient today.  Concerns regarding medicines are outlined above.  Orders Placed This Encounter  Procedures  . EKG 12-Lead   No orders of the defined types were placed in this encounter.   Patient Instructions  Medication Instructions:  Your Physician recommend you continue on your current medication as directed.    If you need a refill on your cardiac medications before your next appointment, please call your pharmacy.   Lab work: None  Testing/Procedures: None  Follow-Up: At [facility], you and your health needs are our priority.  As part of our continuing mission to provide you with exceptional heart care, we have created designated Provider Care Teams.  These Care Teams include your primary Cardiologist (physician) and Advanced Practice Providers (APPs -  Physician Assistants and Nurse Practitioners) who all work together to provide you with the care you need, when you need it. You will need a follow up appointment in 1 years.  Please call our office 2 months in advance to schedule this appointment.  You may see Buford Dresser, MD or one of the following Advanced Practice Providers on your designated Care Team:   Rosaria Ferries, PA-C . Jory Sims, DNP, ANP      Signed, Buford Dresser, MD PhD 09/21/2018 3:29 PM    [place]

## 2018-09-21 NOTE — Patient Instructions (Signed)

## 2018-09-21 NOTE — Telephone Encounter (Signed)
LVM, asking to call back and be pre-screened for COVID-19.

## 2018-09-23 ENCOUNTER — Encounter: Payer: Self-pay | Admitting: Cardiology

## 2018-09-26 NOTE — Progress Notes (Signed)
Alexis Chase is a 48 y.o. female is here for follow up.  History of Present Illness:   HPI: Patient has struggled with weight loss.  She feels that her issue is that she has not been focused on diet or exercise enough.  She understands that some of her medications may also inhibit weight loss.  She would like some time to try to work through her plateau on her own.  We did discuss GLP-1 receptor agonist but will see how she is doing over the next few months first.  Labs reviewed together and were at goal.  Blood pressure is still slightly elevated so we will continue with her current treatment.  Prolia has been authorized by [employer] but we are still waiting on authorization and coverage from the pharmacy.  Patient did visit her cardiologist recently.  She continues on her current lipophilic statin.  Patient is interested in getting her scans, normally completed with her oncologist in [place], at [place].  I let her know that I am okay with this.  Health Maintenance Due  Topic Date Due  . INFLUENZA VACCINE  09/25/2018   Depression screen [facility] 2/9 09/27/2018 08/20/2018 02/10/2018  Decreased Interest 0 0 0  Down, Depressed, Hopeless 0 0 0  PHQ - 2 Score 0 0 0  Altered sleeping 0 0 -  Tired, decreased energy 0 0 -  Change in appetite 0 0 -  Feeling bad or failure about yourself  0 0 -  Trouble concentrating 0 0 -  Moving slowly or fidgety/restless 0 0 -  Suicidal thoughts 0 0 -  PHQ-9 Score 0 0 -  Difficult doing work/chores Not difficult at all Not difficult at all -   PMHx, SurgHx, SocialHx, FamHx, Medications, and Allergies were reviewed in the Visit Navigator and updated as appropriate.   Patient Active Problem List   Diagnosis Date Noted  . History of cholecystectomy 09/27/2018  . History of total hysterectomy 09/27/2018  . Use of letrozole (Femara) 08/20/2018  . Decreased bone mass 08/20/2018  . 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 Redmond Pulling 03/29/2018  . SUI (stress urinary incontinence, female) 01/25/2018  . Metabolic syndrome 13/09/6576  . Vitamin D deficiency 02/08/2017  . Essential hypertension, on Norvasc 02/07/2017  . Hyperlipidemia, on statin 02/07/2017  . Dysthymia, Rx Lexapro 02/07/2017  . History of breast cancer, Dx 2015, ER +, HER2 -, grade 3, s/p adriamycin and radiation 02/07/2017   Social History   Tobacco Use  . Smoking status: Former Smoker    Years: 5.00  . Smokeless tobacco: Never Used  . Tobacco comment: intermittently; socially   Substance Use Topics  . Alcohol use: Yes    Alcohol/week: 1.0 standard drinks    Types: 1 Glasses of wine per week    Comment: once a week   . Drug use: Never   Current Medications and Allergies   .  amLODipine (NORVASC) 5 MG tablet, TAKE 1 TABLET BY MOUTH DAILY., Disp: 30 tablet, Rfl: 3 .  atorvastatin (LIPITOR) 20 MG tablet, Take 1 tablet (20 mg total) by mouth daily., Disp: 90 tablet, Rfl: 1 .  Calcium 500-100 MG-UNIT CHEW, Chew 3 each by mouth daily., Disp: 270 tablet, Rfl: 1 .  Cholecalciferol (VITAMIN D) 125 MCG (5000 UT) CAPS, Take 5,000 Units by mouth daily., Disp: 90 capsule, Rfl: 1 .  escitalopram (LEXAPRO) 10 MG tablet, TAKE 1 TABLET BY MOUTH ONCE DAILY, Disp: 90 tablet, Rfl: 1 .  letrozole (FEMARA) 2.5 MG tablet, Take 1 tablet (2.5 mg total) by mouth daily., Disp: 30 tablet, Rfl: 3 .  Multiple Vitamin (MULTIVITAMIN) tablet, Take 1 tablet by mouth 2 (two) times daily. Nutra Essentials, Disp: , Rfl:  .  Multiple Vitamins-Minerals (BARIATRIC FUSION) CHEW, Chew 1 tablet by mouth 2 (two) times daily., Disp: 90 tablet, Rfl: 1 .  Omega-3 Fatty Acids (EPA PO), Take 4 capsules by mouth 2 (two) times daily., Disp: , Rfl:  .  Probiotic Product (PROBIOTIC DAILY PO), Take 1 tablet by mouth daily., Disp: , Rfl:    Allergies  Allergen Reactions  . Acyclovir And Related    Review of Systems   Pertinent items are noted in the HPI. Otherwise, a  complete ROS is negative.  Vitals   Vitals:   09/27/18 0826  BP: (!) 130/98  Pulse: 72  Temp: 97.6 F (36.4 C)  TempSrc: Temporal  Weight: 225 lb 9.6 oz (102.3 kg)  Height: 5' 7" (1.702 m)     Body mass index is 35.33 kg/m.  Physical Exam   Physical Exam Vitals signs and nursing note reviewed.  HENT:     Head: Normocephalic and atraumatic.  Eyes:     Pupils: Pupils are equal, round, and reactive to light.  Neck:     Musculoskeletal: Normal range of motion and neck supple.  Cardiovascular:     Rate and Rhythm: Normal rate and regular rhythm.     Heart sounds: Normal heart sounds.  Pulmonary:     Effort: Pulmonary effort is normal.  Abdominal:     Palpations: Abdomen is soft.  Skin:    General: Skin is warm.  Psychiatric:        Behavior: Behavior normal.     Results for orders placed or performed in visit on 08/24/18  VITAMIN D 25 Hydroxy (Vit-D Deficiency, Fractures)  Result Value Ref Range   VITD 88.27 30.00 - 100.00 ng/mL  Vitamin B12  Result Value Ref Range   Vitamin B-12 406 211 - 911 pg/mL  TSH  Result Value Ref Range   TSH 1.47 0.35 - 4.50 uIU/mL  Lipid panel  Result Value Ref Range   Cholesterol 156 0 - 200 mg/dL   Triglycerides 171.0 (H) 0.0 - 149.0 mg/dL   HDL 49.90 >39.00 mg/dL   VLDL 34.2 0.0 - 40.0 mg/dL   LDL Cholesterol 72 0 - 99 mg/dL   Total CHOL/HDL Ratio 3    NonHDL 106.15   Comprehensive metabolic panel  Result Value Ref Range   Sodium 142 135 - 145 mEq/L   Potassium 4.3 3.5 - 5.1 mEq/L   Chloride 105 96 - 112 mEq/L   CO2 28 19 - 32 mEq/L   Glucose, Bld 77 70 - 99 mg/dL   BUN 14 6 - 23 mg/dL   Creatinine, Ser 0.83 0.40 - 1.20 mg/dL   Total Bilirubin 0.5 0.2 - 1.2 mg/dL   Alkaline Phosphatase 106 39 - 117 U/L   AST 18 0 - 37 U/L   ALT 17 0 - 35 U/L   Total Protein 6.5 6.0 - 8.3 g/dL   Albumin 4.1 3.5 - 5.2 g/dL   Calcium 9.3 8.4 - 10.5 mg/dL   GFR 72.98 >60.00 mL/min  CBC with Differential/Platelet  Result Value Ref Range    WBC 4.4 4.0 - 10.5 K/uL   RBC 4.11 3.87 - 5.11 Mil/uL   Hemoglobin 13.4 12.0 - 15.0 g/dL   HCT 39.1 36.0 - 46.0 %  MCV 95.0 78.0 - 100.0 fl   MCHC 34.2 30.0 - 36.0 g/dL   RDW 12.8 11.5 - 15.5 %   Platelets 209.0 150.0 - 400.0 K/uL   Neutrophils Relative % 47.8 43.0 - 77.0 %   Lymphocytes Relative 41.5 12.0 - 46.0 %   Monocytes Relative 6.5 3.0 - 12.0 %   Eosinophils Relative 3.4 0.0 - 5.0 %   Basophils Relative 0.8 0.0 - 3.0 %   Neutro Abs 2.1 1.4 - 7.7 K/uL   Lymphs Abs 1.8 0.7 - 4.0 K/uL   Monocytes Absolute 0.3 0.1 - 1.0 K/uL   Eosinophils Absolute 0.2 0.0 - 0.7 K/uL   Basophils Absolute 0.0 0.0 - 0.1 K/uL  Hemoglobin A1c  Result Value Ref Range   Hgb A1c MFr Bld 5.5 4.6 - 6.5 %   Assessment and Plan   Micole was seen today for follow-up.  Diagnoses and all orders for this visit:  Pure hypercholesterolemia  S/P laparoscopic sleeve gastrectomy, 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 Redmond Pulling  Use of letrozole (Femara)  Decreased bone mass  History of breast cancer, ER +, HER2 -, grade 3, s/p chemo and radiation. 3 year anniversery recently  Essential hypertension, on Norvasc  History of cholecystectomy  History of total hysterectomy  Dysthymia   . Orders and follow up as documented in [place], reviewed diet, exercise and weight control, cardiovascular risk and specific lipid/LDL goals reviewed, reviewed medications and side effects in detail.  . Reviewed expectations re: course of current medical issues. . Outlined signs and symptoms indicating need for more acute intervention. . Patient verbalized understanding and all questions were answered. . Patient received an After Visit Summary.   Briscoe Deutscher, DO [place], [place] 10/03/2018

## 2018-09-27 ENCOUNTER — Other Ambulatory Visit: Payer: Self-pay

## 2018-09-27 ENCOUNTER — Ambulatory Visit (INDEPENDENT_AMBULATORY_CARE_PROVIDER_SITE_OTHER): Payer: 59 | Admitting: Family Medicine

## 2018-09-27 ENCOUNTER — Encounter: Payer: Self-pay | Admitting: Family Medicine

## 2018-09-27 VITALS — BP 130/98 | HR 72 | Temp 97.6°F | Ht 67.0 in | Wt 225.6 lb

## 2018-09-27 DIAGNOSIS — I10 Essential (primary) hypertension: Secondary | ICD-10-CM

## 2018-09-27 DIAGNOSIS — E7800 Pure hypercholesterolemia, unspecified: Secondary | ICD-10-CM | POA: Diagnosis not present

## 2018-09-27 DIAGNOSIS — F341 Dysthymic disorder: Secondary | ICD-10-CM

## 2018-09-27 DIAGNOSIS — M8580 Other specified disorders of bone density and structure, unspecified site: Secondary | ICD-10-CM

## 2018-09-27 DIAGNOSIS — Z90710 Acquired absence of both cervix and uterus: Secondary | ICD-10-CM

## 2018-09-27 DIAGNOSIS — Z79811 Long term (current) use of aromatase inhibitors: Secondary | ICD-10-CM

## 2018-09-27 DIAGNOSIS — Z9049 Acquired absence of other specified parts of digestive tract: Secondary | ICD-10-CM

## 2018-09-27 DIAGNOSIS — Z9884 Bariatric surgery status: Secondary | ICD-10-CM

## 2018-09-27 DIAGNOSIS — Z853 Personal history of malignant neoplasm of breast: Secondary | ICD-10-CM | POA: Diagnosis not present

## 2018-09-29 NOTE — Telephone Encounter (Signed)
Medical policy is currently paying at 50% out of pocket cost to patient.  Pharmacy policy declined coverage for this patient.

## 2018-10-05 ENCOUNTER — Other Ambulatory Visit: Payer: Self-pay | Admitting: Family Medicine

## 2018-10-05 DIAGNOSIS — E7800 Pure hypercholesterolemia, unspecified: Secondary | ICD-10-CM

## 2018-10-05 MED FILL — ATORVASTATIN 20 MG TABLET: 20 | 90 days supply | Qty: 90 | Fill #0

## 2018-10-05 MED FILL — LETROZOLE 2.5 MG TABLET: 2.5 | 30 days supply | Qty: 30 | Fill #1

## 2018-10-21 ENCOUNTER — Encounter: Payer: Self-pay | Admitting: Family Medicine

## 2018-10-26 NOTE — Progress Notes (Signed)
Alexis Chase is a 48 y.o. female is here for follow up.  History of Present Illness:   Alexis Chase, CMA acting as scribe for Dr. Briscoe Chase.   HPI:   Pure hypercholesterolemia  Is the patient taking medications without problems? Yes. Does the patient complain of muscle aches?  Yes. Trying to exercise on a regular basis? Yes. Compliant with diet? Yes.  Lab Results  Component Value Date   CHOL 156 08/24/2018   HDL 49.90 08/24/2018   LDLCALC 72 08/24/2018   TRIG 171.0 (H) 08/24/2018   CHOLHDL 3 08/24/2018   Lab Results  Component Value Date   ALT 17 08/24/2018   AST 18 08/24/2018   ALKPHOS 106 08/24/2018   BILITOT 0.5 08/24/2018     S/P laparoscopic sleeve gastrectomy, 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 Redmond Chase Has not had any change in weight from last visit.   Use of letrozole (Femara) Decreased bone mass History of breast cancer, ER +, HER2 -, grade 3, s/p chemo and radiation. 3 year anniversery recently  Essential hypertension, on Norvasc Review: taking medications as instructed, no medication side effects noted, no TIAs, no chest pain on exertion, no dyspnea on exertion, no swelling of ankles. Smoker: No.   BP Readings from Last 3 Encounters:  10/27/18 120/73  09/27/18 (!) 130/98  09/21/18 112/78   Lab Results  Component Value Date   CREATININE 0.83 08/24/2018   CREATININE 0.90 01/26/2018   CREATININE 0.85 01/20/2018     Health Maintenance Due  Topic Date Due  . INFLUENZA VACCINE  09/25/2018   Depression screen Alexis Chase 2/9 10/27/2018 10/27/2018 09/27/2018  Decreased Interest 0 0 0  Down, Depressed, Hopeless 0 0 0  PHQ - 2 Score 0 0 0  Altered sleeping 1 1 0  Tired, decreased energy 0 0 0  Change in appetite 0 0 0  Feeling bad or failure about yourself  0 0 0  Trouble concentrating 0 0 0  Moving slowly or fidgety/restless 0 0 0  Suicidal thoughts 0 0 0  PHQ-9 Score 1 1 0  Difficult doing work/chores Not difficult at all Not difficult at all Not  difficult at all   PMHx, SurgHx, SocialHx, FamHx, Medications, and Allergies were reviewed in the Visit Navigator and updated as appropriate.   Patient Active Problem List   Diagnosis Date Noted  . Osteopenia 11/01/2018  . History of cholecystectomy 09/27/2018  . History of total hysterectomy 09/27/2018  . Use of letrozole (Femara) 08/20/2018  . Decreased bone mass 08/20/2018  . 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 Redmond Chase 03/29/2018  . SUI (stress urinary incontinence, female) 01/25/2018  . Metabolic syndrome 45/62/5638  . Vitamin D deficiency 02/08/2017  . Essential hypertension, on Norvasc 02/07/2017  . Hyperlipidemia, on statin 02/07/2017  . Dysthymia, Rx Lexapro 02/07/2017  . History of breast cancer, Dx 2015, ER +, HER2 -, grade 3, s/p adriamycin and radiation 02/07/2017   Social History   Tobacco Use  . Smoking status: Former Smoker    Years: 5.00  . Smokeless tobacco: Never Used  . Tobacco comment: intermittently; socially   Substance Use Topics  . Alcohol use: Yes    Alcohol/week: 1.0 standard drinks    Types: 1 Glasses of wine per week    Comment: once a week   . Drug use: Never   Current Medications and Allergies   .  amLODipine (NORVASC) 5 MG tablet, TAKE 1 TABLET BY MOUTH DAILY., Disp: 30  tablet, Rfl: 3 .  atorvastatin (LIPITOR) 20 MG tablet, TAKE 1 TABLET (20 MG TOTAL) BY MOUTH DAILY., Disp: 90 tablet, Rfl: 1 .  Calcium 500-100 MG-UNIT CHEW, Chew 3 each by mouth daily., Disp: 270 tablet, Rfl: 1 .  Cholecalciferol (VITAMIN D) 125 MCG (5000 UT) CAPS, Take 5,000 Units by mouth daily., Disp: 90 capsule, Rfl: 1 .  escitalopram (LEXAPRO) 10 MG tablet, TAKE 1 TABLET BY MOUTH ONCE DAILY, Disp: 90 tablet, Rfl: 1 .  letrozole (FEMARA) 2.5 MG tablet, Take 1 tablet (2.5 mg total) by mouth daily., Disp: 30 tablet, Rfl: 3 .  Multiple Vitamin (MULTIVITAMIN) tablet, Take 1 tablet by mouth 2 (two) times daily. Nutra Essentials, Disp: , Rfl:   .  Multiple Vitamins-Minerals (BARIATRIC FUSION) CHEW, Chew 1 tablet by mouth 2 (two) times daily. (Patient not taking: Reported on 09/27/2018), Disp: 90 tablet, Rfl: 1 .  Omega-3 Fatty Acids (EPA PO), Take 4 capsules by mouth 2 (two) times daily., Disp: , Rfl:  .  Probiotic Product (PROBIOTIC DAILY PO), Take 1 tablet by mouth daily., Disp: , Rfl:   No Active Allergies   Review of Systems   Pertinent items are noted in the HPI. Otherwise, a complete ROS is negative.  Vitals   Vitals:   10/27/18 0809  BP: 120/73  Pulse: 78  Temp: 97.8 F (36.6 C)  TempSrc: Oral  SpO2: 97%  Weight: 226 lb (102.5 kg)  Height: '5\' 7"'$  (1.702 m)     Body mass index is 35.4 kg/m.  Physical Exam   Physical Exam Vitals signs and nursing note reviewed.  HENT:     Head: Normocephalic and atraumatic.  Eyes:     Pupils: Pupils are equal, round, and reactive to light.  Neck:     Musculoskeletal: Normal range of motion and neck supple.  Cardiovascular:     Rate and Rhythm: Normal rate and regular rhythm.     Heart sounds: Normal heart sounds.  Pulmonary:     Effort: Pulmonary effort is normal.  Abdominal:     Palpations: Abdomen is soft.  Skin:    General: Skin is warm.  Psychiatric:        Behavior: Behavior normal.    Assessment and Plan   Alexis Chase was seen today for follow-up.  Diagnoses and all orders for this visit:  Osteopenia, with Hx of fragility fracture, on Femara s/p breast cancer Comments: Prolia pharmacy coverage denied. Will appeal.   Weight gain Comments: Stalled despite exercise, vegan intake (though admittedly less protein), and still early s/p VSG so should be losing more easily. On Femara.  Orders: -     Insulin, Free (Bioactive)  History of breast cancer, Dx 2015, ER +, HER2 -, grade 3, s/p adriamycin and radiation  S/P laparoscopic sleeve gastrectomy, 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 Redmond Chase  Use of letrozole (Femara)    . Orders and follow up as  documented in [place], reviewed diet, exercise and weight control, cardiovascular risk and specific lipid/LDL goals reviewed, reviewed medications and side effects in detail.  . Reviewed expectations re: course of current medical issues. . Outlined signs and symptoms indicating need for more acute intervention. . Patient verbalized understanding and all questions were answered. . Patient received an After Visit Summary.  CMA served as Education administrator during this visit. History, Physical, and Plan performed by medical provider. The above documentation has been reviewed and is accurate and complete. Alexis Chase, D.O.  Alexis Deutscher, DO [place], [facility] 11/01/2018

## 2018-10-27 ENCOUNTER — Encounter: Payer: Self-pay | Admitting: Family Medicine

## 2018-10-27 ENCOUNTER — Ambulatory Visit (INDEPENDENT_AMBULATORY_CARE_PROVIDER_SITE_OTHER): Payer: 59 | Admitting: Family Medicine

## 2018-10-27 ENCOUNTER — Other Ambulatory Visit: Payer: Self-pay

## 2018-10-27 VITALS — BP 120/73 | HR 78 | Temp 97.8°F | Ht 67.0 in | Wt 226.0 lb

## 2018-10-27 DIAGNOSIS — M8580 Other specified disorders of bone density and structure, unspecified site: Secondary | ICD-10-CM

## 2018-10-27 DIAGNOSIS — Z79811 Long term (current) use of aromatase inhibitors: Secondary | ICD-10-CM | POA: Diagnosis not present

## 2018-10-27 DIAGNOSIS — Z853 Personal history of malignant neoplasm of breast: Secondary | ICD-10-CM | POA: Diagnosis not present

## 2018-10-27 DIAGNOSIS — Z9884 Bariatric surgery status: Secondary | ICD-10-CM | POA: Diagnosis not present

## 2018-10-27 DIAGNOSIS — R635 Abnormal weight gain: Secondary | ICD-10-CM | POA: Diagnosis not present

## 2018-10-27 NOTE — Patient Instructions (Signed)
Healthy Weight and Wellness

## 2018-11-01 ENCOUNTER — Encounter: Payer: Self-pay | Admitting: Family Medicine

## 2018-11-01 DIAGNOSIS — M8580 Other specified disorders of bone density and structure, unspecified site: Secondary | ICD-10-CM | POA: Insufficient documentation

## 2018-11-03 LAB — INSULIN, FREE (BIOACTIVE): Insulin, Free: 11 u[IU]/mL (ref 1.5–14.9)

## 2018-11-04 ENCOUNTER — Other Ambulatory Visit: Payer: Self-pay

## 2018-11-04 DIAGNOSIS — I10 Essential (primary) hypertension: Secondary | ICD-10-CM

## 2018-11-04 DIAGNOSIS — F341 Dysthymic disorder: Secondary | ICD-10-CM

## 2018-11-04 MED ORDER — TRULICITY 1.5 MG/0.5ML ~~LOC~~ SOAJ: 1.5000 mg | SUBCUTANEOUS | 3 refills | Status: DC

## 2018-11-04 MED ORDER — AMLODIPINE BESYLATE 5 MG PO TABS: 5.0000 mg | ORAL_TABLET | Freq: Every day | ORAL | 3 refills | Status: DC

## 2018-11-04 MED ORDER — ONDANSETRON 4 MG PO TBDP: 4.0000 mg | ORAL_TABLET | Freq: Three times a day (TID) | ORAL | 0 refills | Status: DC | PRN

## 2018-11-04 MED ORDER — ESCITALOPRAM OXALATE 10 MG PO TABS: 10.0000 mg | ORAL_TABLET | Freq: Every day | ORAL | 1 refills | Status: DC

## 2018-11-04 MED FILL — TRULICITY 1.5 MG/0.5 ML PEN: 1.5 | 84 days supply | Qty: 6 | Fill #0

## 2018-11-04 MED FILL — AMLODIPINE BESYLATE 5 MG TA: 5 | 90 days supply | Qty: 90 | Fill #0

## 2018-11-04 MED FILL — ESCITALOPRAM 10 MG TABLET: 10 | 90 days supply | Qty: 90 | Fill #0

## 2018-11-04 MED FILL — ONDANSETRON ODT 4 MG TABLET: 4 | 7 days supply | Qty: 20 | Fill #0

## 2018-11-04 NOTE — Progress Notes (Unsigned)
z

## 2018-11-09 ENCOUNTER — Other Ambulatory Visit: Payer: Self-pay

## 2018-11-09 ENCOUNTER — Telehealth: Payer: Self-pay

## 2018-11-09 MED ORDER — DENOSUMAB 60 MG/ML ~~LOC~~ SOSY: 60.0000 mg | PREFILLED_SYRINGE | Freq: Once | SUBCUTANEOUS | 0 refills | Status: AC

## 2018-11-09 MED FILL — PROLIA 60 MG/ML SOLN: 60 | 180 days supply | Qty: 1 | Fill #0

## 2018-11-09 NOTE — Telephone Encounter (Signed)
I have communicated with the patient via [place].

## 2018-11-09 NOTE — Telephone Encounter (Signed)
Awesome. Please call to let her know and send in Prolia. Thanks!! EW

## 2018-11-09 NOTE — Telephone Encounter (Signed)
Patient's appeal for her Prolia was approved through her pharmacy benefit for 2 injections through 11/01/2019.  The Prolia must Rx must be sent to a [facility] and patient will have to pick up there and bring to clinic to have administered.

## 2018-11-10 MED FILL — LETROZOLE 2.5 MG TABLET: 2.5 | 30 days supply | Qty: 30 | Fill #2

## 2018-11-11 ENCOUNTER — Other Ambulatory Visit: Payer: Self-pay

## 2018-11-11 DIAGNOSIS — C50412 Malignant neoplasm of upper-outer quadrant of left female breast: Secondary | ICD-10-CM | POA: Diagnosis not present

## 2018-11-11 DIAGNOSIS — C50919 Malignant neoplasm of unspecified site of unspecified female breast: Secondary | ICD-10-CM | POA: Diagnosis not present

## 2018-11-11 DIAGNOSIS — Z1231 Encounter for screening mammogram for malignant neoplasm of breast: Secondary | ICD-10-CM | POA: Diagnosis not present

## 2018-11-11 DIAGNOSIS — E559 Vitamin D deficiency, unspecified: Secondary | ICD-10-CM | POA: Diagnosis not present

## 2018-11-11 MED ORDER — DENOSUMAB 60 MG/ML ~~LOC~~ SOSY: 60.0000 mg | PREFILLED_SYRINGE | Freq: Once | SUBCUTANEOUS | 0 refills | Status: AC

## 2018-11-12 DIAGNOSIS — Z1231 Encounter for screening mammogram for malignant neoplasm of breast: Secondary | ICD-10-CM | POA: Diagnosis not present

## 2018-11-12 DIAGNOSIS — C50412 Malignant neoplasm of upper-outer quadrant of left female breast: Secondary | ICD-10-CM | POA: Diagnosis not present

## 2018-12-03 ENCOUNTER — Encounter: Payer: Self-pay | Admitting: Family Medicine

## 2018-12-03 ENCOUNTER — Other Ambulatory Visit: Payer: Self-pay

## 2018-12-03 ENCOUNTER — Ambulatory Visit (INDEPENDENT_AMBULATORY_CARE_PROVIDER_SITE_OTHER): Payer: 59

## 2018-12-03 DIAGNOSIS — Z23 Encounter for immunization: Secondary | ICD-10-CM

## 2018-12-14 ENCOUNTER — Ambulatory Visit (INDEPENDENT_AMBULATORY_CARE_PROVIDER_SITE_OTHER): Payer: 59 | Admitting: Family Medicine

## 2018-12-14 ENCOUNTER — Encounter: Payer: Self-pay | Admitting: Family Medicine

## 2018-12-14 ENCOUNTER — Other Ambulatory Visit: Payer: Self-pay

## 2018-12-14 VITALS — Ht 67.0 in | Wt 220.0 lb

## 2018-12-14 DIAGNOSIS — M8580 Other specified disorders of bone density and structure, unspecified site: Secondary | ICD-10-CM | POA: Diagnosis not present

## 2018-12-14 DIAGNOSIS — E7800 Pure hypercholesterolemia, unspecified: Secondary | ICD-10-CM

## 2018-12-14 DIAGNOSIS — F341 Dysthymic disorder: Secondary | ICD-10-CM | POA: Diagnosis not present

## 2018-12-14 DIAGNOSIS — Z9884 Bariatric surgery status: Secondary | ICD-10-CM

## 2018-12-14 DIAGNOSIS — Z853 Personal history of malignant neoplasm of breast: Secondary | ICD-10-CM

## 2018-12-14 DIAGNOSIS — E8881 Metabolic syndrome: Secondary | ICD-10-CM | POA: Diagnosis not present

## 2018-12-14 DIAGNOSIS — E88819 Insulin resistance, unspecified: Secondary | ICD-10-CM

## 2018-12-14 DIAGNOSIS — I10 Essential (primary) hypertension: Secondary | ICD-10-CM | POA: Diagnosis not present

## 2018-12-14 MED ORDER — LETROZOLE 2.5 MG PO TABS: 2.5000 mg | ORAL_TABLET | Freq: Every day | ORAL | 3 refills | Status: DC

## 2018-12-14 MED ORDER — VITAMIN D 125 MCG (5000 UT) PO CAPS: 5000.0000 [IU] | ORAL_CAPSULE | Freq: Every day | ORAL | 1 refills | Status: AC

## 2018-12-14 MED ORDER — AMLODIPINE BESYLATE 5 MG PO TABS: 5.0000 mg | ORAL_TABLET | Freq: Every day | ORAL | 3 refills | Status: DC

## 2018-12-14 MED ORDER — ATORVASTATIN CALCIUM 20 MG PO TABS: 20.0000 mg | ORAL_TABLET | Freq: Every day | ORAL | 1 refills | Status: DC

## 2018-12-14 MED ORDER — ESCITALOPRAM OXALATE 10 MG PO TABS: 10.0000 mg | ORAL_TABLET | Freq: Every day | ORAL | 1 refills | Status: DC

## 2018-12-14 MED ORDER — TRULICITY 1.5 MG/0.5ML ~~LOC~~ SOAJ: 1.5000 mg | SUBCUTANEOUS | 3 refills | Status: DC

## 2018-12-14 MED ORDER — BARIATRIC FUSION PO CHEW: 1.0000 | CHEWABLE_TABLET | Freq: Two times a day (BID) | ORAL | 1 refills | Status: DC

## 2018-12-14 MED FILL — LETROZOLE 2.5 MG TABLET: 2.5 | 90 days supply | Qty: 90 | Fill #0

## 2018-12-14 NOTE — Progress Notes (Signed)
Virtual Visit via Video   Due to the COVID-19 pandemic, this visit was completed with telemedicine (audio/video) technology to reduce patient and provider exposure as well as to preserve personal protective equipment.   I connected with Alexis Chase by a video enabled telemedicine application and verified that I am speaking with the correct person using two identifiers. Location patient: Home Location provider: [facility], Office Persons participating in the virtual visit: Alexis Chase, Briscoe Deutscher, DO   I discussed the limitations of evaluation and management by telemedicine and the availability of in person appointments. The patient expressed understanding and agreed to proceed.  Care Team   Patient Care Team: Briscoe Deutscher, DO as PCP - General (Family Medicine) Buford Dresser, MD as PCP - Cardiology (Cardiology)  Subjective:   HPI:  Tolerating Trulicity. Feels full more quickly now. Down 6 pounds since last visit. Some nausea on higher dose, but tolerating.   Had Prolia injection, due in 6 months.   PCP may be able to get screening CTs going forward. Oncologist notes in [place].   Patient Active Problem List   Diagnosis Date Noted  . Osteopenia, Rx Prolia 11/01/2018  . History of cholecystectomy 09/27/2018  . History of total hysterectomy 09/27/2018  . Use of letrozole (Femara) 08/20/2018  . 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 Redmond Pulling 03/29/2018  . SUI (stress urinary incontinence, female) 01/25/2018  . Vitamin D deficiency 02/08/2017  . Essential hypertension, on Norvasc 02/07/2017  . Hyperlipidemia, on Lipitor 02/07/2017  . Dysthymia, Rx Lexapro 02/07/2017  . History of breast cancer, Dx 2015, ER +, HER2 -, grade 3, s/p adriamycin and radiation 02/07/2017    Social History   Tobacco Use  . Smoking status: Former Smoker    Years: 5.00  . Smokeless tobacco: Never Used  . Tobacco comment: intermittently; socially     Substance Use Topics  . Alcohol use: Yes    Alcohol/week: 1.0 standard drinks    Types: 1 Glasses of wine per week    Comment: once a week     Current Outpatient Medications:  .  amLODipine (NORVASC) 5 MG tablet, Take 1 tablet (5 mg total) by mouth daily., Disp: 90 tablet, Rfl: 3 .  atorvastatin (LIPITOR) 20 MG tablet, Take 1 tablet (20 mg total) by mouth daily., Disp: 90 tablet, Rfl: 1 .  Calcium 500-100 MG-UNIT CHEW, Chew 3 each by mouth daily., Disp: 270 tablet, Rfl: 1 .  Cholecalciferol (VITAMIN D) 125 MCG (5000 UT) CAPS, Take 5,000 Units by mouth daily., Disp: 90 capsule, Rfl: 1 .  Dulaglutide (TRULICITY) 1.5 IO/2.7OJ SOPN, Inject 1.5 mg into the skin once a week., Disp: 12 pen, Rfl: 3 .  escitalopram (LEXAPRO) 10 MG tablet, Take 1 tablet (10 mg total) by mouth daily., Disp: 90 tablet, Rfl: 1 .  letrozole (FEMARA) 2.5 MG tablet, Take 1 tablet (2.5 mg total) by mouth daily., Disp: 90 tablet, Rfl: 3 .  Multiple Vitamin (MULTIVITAMIN) tablet, Take 1 tablet by mouth 2 (two) times daily. Nutra Essentials, Disp: , Rfl:  .  Multiple Vitamins-Minerals (BARIATRIC FUSION) CHEW, Chew 1 tablet by mouth 2 (two) times daily., Disp: 90 tablet, Rfl: 1 .  Omega-3 Fatty Acids (EPA PO), Take 4 capsules by mouth 2 (two) times daily., Disp: , Rfl:  .  Probiotic Product (PROBIOTIC DAILY PO), Take 1 tablet by mouth daily., Disp: , Rfl:   No Known Allergies  Objective:   VITALS: Per patient if applicable, see vitals.  GENERAL: Alert, appears well and in no acute distress. HEENT: Atraumatic, conjunctiva clear, no obvious abnormalities on inspection of external nose and ears. NECK: Normal movements of the head and neck. CARDIOPULMONARY: No increased WOB. Speaking in clear sentences. I:E ratio WNL.  MS: Moves all visible extremities without noticeable abnormality. PSYCH: Pleasant and cooperative, well-groomed. Speech normal rate and rhythm. Affect is appropriate. Insight and judgement are appropriate.  Attention is focused, linear, and appropriate.  NEURO: CN grossly intact. Oriented as arrived to appointment on time with no prompting. Moves both UE equally.  SKIN: No obvious lesions, wounds, erythema, or cyanosis noted on face or hands.  Depression screen [facility] 2/9 10/27/2018 10/27/2018 09/27/2018  Decreased Interest 0 0 0  Down, Depressed, Hopeless 0 0 0  PHQ - 2 Score 0 0 0  Altered sleeping 1 1 0  Tired, decreased energy 0 0 0  Change in appetite 0 0 0  Feeling bad or failure about yourself  0 0 0  Trouble concentrating 0 0 0  Moving slowly or fidgety/restless 0 0 0  Suicidal thoughts 0 0 0  PHQ-9 Score 1 1 0  Difficult doing work/chores Not difficult at all Not difficult at all Not difficult at all    Assessment and Plan:   Alexis Chase was seen today for follow-up.  Diagnoses and all orders for this visit:  Insulin resistance -     Dulaglutide (TRULICITY) 1.5 UK/0.2RK SOPN; Inject 1.5 mg into the skin once a week.  Osteopenia, with Hx of fragility fracture and on Femara, Rx Prolia by PCP  S/P laparoscopic sleeve gastrectomy, 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 Redmond Pulling Comments: Doing well. Walking 3 miles/day. Working to get protein at goal. Staying hydrated. Taking MVM. Orders: -     Multiple Vitamins-Minerals (BARIATRIC FUSION) CHEW; Chew 1 tablet by mouth 2 (two) times daily. -     Cholecalciferol (VITAMIN D) 125 MCG (5000 UT) CAPS; Take 5,000 Units by mouth daily.  History of breast cancer Comments: ER +, HER2 -, grade 3, s/p chemo and radiation. 3 year anniversery recently. Followed by prior Oncologist. Will request records.  Orders: -     letrozole (FEMARA) 2.5 MG tablet; Take 1 tablet (2.5 mg total) by mouth daily.  Dysthymia -     escitalopram (LEXAPRO) 10 MG tablet; Take 1 tablet (10 mg total) by mouth daily.  Pure hypercholesterolemia -     atorvastatin (LIPITOR) 20 MG tablet; Take 1 tablet (20 mg total) by mouth daily.  Essential hypertension -     amLODipine  (NORVASC) 5 MG tablet; Take 1 tablet (5 mg total) by mouth daily.    [facility] COVID-19 Education: The signs and symptoms of COVID-19 were discussed with the patient and how to seek care for testing if needed. The importance of social distancing was discussed today. . Reviewed expectations re: course of current medical issues. . Discussed self-management of symptoms. . Outlined signs and symptoms indicating need for more acute intervention. . Patient verbalized understanding and all questions were answered. [facility] Health Maintenance issues including appropriate healthy diet, exercise, and smoking avoidance were discussed with patient. . See orders for this visit as documented in the electronic medical record.  Briscoe Deutscher, DO  Records requested if needed. Time spent: 25 minutes, of which >50% was spent in obtaining information about her symptoms, reviewing her previous labs, evaluations, and treatments, counseling her about her condition (please see the discussed topics above), and developing a plan to further investigate it; she had a  number of questions which I addressed.

## 2019-02-15 DIAGNOSIS — C50412 Malignant neoplasm of upper-outer quadrant of left female breast: Secondary | ICD-10-CM | POA: Diagnosis not present

## 2019-02-22 MED FILL — ESCITALOPRAM 10 MG TABLET: 10 | 90 days supply | Qty: 90 | Fill #1

## 2019-02-22 MED FILL — ATORVASTATIN 20 MG TABLET: 20 | 90 days supply | Qty: 90 | Fill #1

## 2019-02-24 MED FILL — TRULICITY 1.5 MG/0.5 ML PEN: 1.5 | 84 days supply | Qty: 6 | Fill #0

## 2019-03-18 DIAGNOSIS — R509 Fever, unspecified: Secondary | ICD-10-CM | POA: Diagnosis not present

## 2019-03-24 MED FILL — LETROZOLE 2.5 MG TABLET: 2.5 | 90 days supply | Qty: 90 | Fill #1

## 2019-05-04 ENCOUNTER — Encounter: Payer: Self-pay | Admitting: Family Medicine

## 2019-05-06 ENCOUNTER — Other Ambulatory Visit: Payer: Self-pay

## 2019-05-06 ENCOUNTER — Encounter: Payer: Self-pay | Admitting: Family Medicine

## 2019-05-06 ENCOUNTER — Ambulatory Visit: Payer: 59 | Admitting: Family Medicine

## 2019-05-06 VITALS — BP 122/78 | HR 78 | Temp 97.8°F | Ht 67.0 in | Wt 219.4 lb

## 2019-05-06 DIAGNOSIS — E782 Mixed hyperlipidemia: Secondary | ICD-10-CM | POA: Diagnosis not present

## 2019-05-06 DIAGNOSIS — M8580 Other specified disorders of bone density and structure, unspecified site: Secondary | ICD-10-CM | POA: Diagnosis not present

## 2019-05-06 DIAGNOSIS — Z9884 Bariatric surgery status: Secondary | ICD-10-CM

## 2019-05-06 DIAGNOSIS — E8881 Metabolic syndrome: Secondary | ICD-10-CM

## 2019-05-06 DIAGNOSIS — F341 Dysthymic disorder: Secondary | ICD-10-CM

## 2019-05-06 DIAGNOSIS — Z79811 Long term (current) use of aromatase inhibitors: Secondary | ICD-10-CM | POA: Diagnosis not present

## 2019-05-06 DIAGNOSIS — E88819 Insulin resistance, unspecified: Secondary | ICD-10-CM

## 2019-05-06 DIAGNOSIS — L680 Hirsutism: Secondary | ICD-10-CM

## 2019-05-06 DIAGNOSIS — Z853 Personal history of malignant neoplasm of breast: Secondary | ICD-10-CM

## 2019-05-06 DIAGNOSIS — Z1211 Encounter for screening for malignant neoplasm of colon: Secondary | ICD-10-CM

## 2019-05-06 DIAGNOSIS — Z1212 Encounter for screening for malignant neoplasm of rectum: Secondary | ICD-10-CM

## 2019-05-06 DIAGNOSIS — E669 Obesity, unspecified: Secondary | ICD-10-CM

## 2019-05-06 LAB — POCT GLYCOSYLATED HEMOGLOBIN (HGB A1C): Hemoglobin A1C: 5.1 % (ref 4.0–5.6)

## 2019-05-06 MED ORDER — SPIRONOLACTONE 100 MG PO TABS: 100.0000 mg | ORAL_TABLET | Freq: Every day | ORAL | 5 refills | Status: DC

## 2019-05-06 MED FILL — SPIRONOLACTONE 100 MG TAB: 100 | 30 days supply | Qty: 30 | Fill #0

## 2019-05-06 NOTE — Progress Notes (Signed)
Subjective  CC:  Chief Complaint  Patient presents with  . Transitions Of Care    TOC from Dr. Juleen China  . Diabetes    insulin resistance. does not check at home. controlling with lifestyle. Taking trulicity  . Hyperlipidemia    Takes atorvastatin daily. no side effects  . vitamin D deficiency    Taking vit d 5000  . Hypertension    stopped taking atorvastatin. low readings and HA    HPI: Alexis Chase is a 49 y.o. female who presents to [facility] [place] today to establish care with me as a new patient.  Reviewed data from chart: multiple records including records from oncology and prior PCP, bone density reports, breast imaging reports and bone scans. Reviewed labs.  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 PCOS, obesity with metabolic syndrome, HLD, and osteopenia on prolia. We discussed these dxs in detail.   Breast cancer: on long term femara; had aggressive hormone positive cancer s/p lumpectomy, radiation and chemo. Has changed diet to lower risk of recurrence  Osteopenia: reviewed bone density reports, last 06/2018. Due to "16%" decrease in density noted at spine, prolia was started. She had her first dose over a year ago.   HDL: treated with statins since 30s.   Obesity: s/p gastric sleeve and dietary changes. Now on trulicity as well. Has lost abou t40 pounds and continues to work on this. Exercising regularly.   Depression: on lexapro since dx of breast cancer.   C/o new and worsening hirsutism w/o change in voice or acne. Worse over last year. Now bothersome.   Assessment  1. Insulin resistance   2. Screening for colorectal cancer   3. Mixed hyperlipidemia   4. Osteopenia, unspecified location   5. History of breast cancer, Dx 2015, ER +, HER2 -, grade 3, s/p adriamycin and radiation   6. Dysthymia   7. S/P  laparoscopic sleeve gastrectomy, 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 Redmond Pulling   8. Use of letrozole (Femara)   9. Obesity (BMI 30-39.9)   10. Hirsutism      Plan   Obesity and insulin resistance on trulicity: will continue meds for 3-6 months then reevaluated. rec continuation of healthy diet and exercise.   HLD: ? If meets criteria. Favor stopping and rechecking levels off meds.   Osteopenia: don't have full results with T scores. On Prolia. ? If best option. Will continue and recheck and discuss with oncology and/or endocrine. rec calcium and vit D and weight bearing exercise  Dysthymia/depression: stable and controlled. Continue lexapro  Long term femara for breast cancer likely contributing to worsening hirsuitism in setting of PCOS: start spironolactone.   HM: due colonscopy for crc screen. Refer to gi.   Follow up:  Return in about 3 months (around 08/06/2019) for complete physical. Orders Placed This Encounter  Procedures  . Ambulatory referral to Gastroenterology  . POCT glycosylated hemoglobin (Hb A1C)   Meds ordered this encounter  Medications  . spironolactone (ALDACTONE) 100 MG tablet    Sig: Take 1 tablet (100 mg total) by mouth daily.    Dispense:  30 tablet    Refill:  5     Depression screen [facility] 2/9 10/27/2018 10/27/2018 09/27/2018 08/20/2018 02/10/2018  Decreased Interest 0 0 0 0 0  Down, Depressed, Hopeless 0 0  0 0 0  PHQ - 2 Score 0 0 0 0 0  Altered sleeping 1 1 0 0 -  Tired, decreased energy 0 0 0 0 -  Change in appetite 0 0 0 0 -  Feeling bad or failure about yourself  0 0 0 0 -  Trouble concentrating 0 0 0 0 -  Moving slowly or fidgety/restless 0 0 0 0 -  Suicidal thoughts 0 0 0 0 -  PHQ-9 Score 1 1 0 0 -  Difficult doing work/chores Not difficult at all Not difficult at all Not difficult at all Not difficult at all -    We updated and reviewed the patient's past history in detail and it is documented below.  Patient Active Problem List   Diagnosis  Date Noted  . Obesity (BMI 30-39.9) 05/06/2019  . Hirsutism 05/06/2019  . Osteopenia 11/01/2018  . Use of letrozole (Femara) 08/20/2018  . 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 Redmond Pulling 03/29/2018  . SUI (stress urinary incontinence, female) 01/25/2018  . Vitamin D deficiency 02/08/2017  . Mixed hyperlipidemia 02/07/2017  . Dysthymia 02/07/2017  . History of breast cancer, Dx 2015, ER +, HER2 -, grade 3, s/p adriamycin and radiation 02/07/2017    Breast cancer history: in 2015, L breast cancer treated with neoadjuvant chemotherapy followed by lumpectomy and radiation, then chemotherapy. Follows up in [place], [place] with oncology.     Health Maintenance  Topic Date Due  . URINE MICROALBUMIN  Never done  . TETANUS/TDAP  06/30/2027  . INFLUENZA VACCINE  Completed  . HIV Screening  Discontinued   Immunization History  Administered Date(s) Administered  . Influenza, High Dose Seasonal PF 11/02/2013, 12/20/2014  . Influenza,inj,Quad PF,6+ Mos 01/07/2016, 02/06/2017, 12/03/2017, 12/03/2018, 12/03/2018  . MMR 07/07/2017, 08/24/2018  . PPD Test 06/29/2017  . Tdap 06/29/2017   Current Meds  Medication Sig  . Ascorbic Acid (VITAMIN C) 1000 MG tablet Take 500 mg by mouth daily. Two capsules daily  . aspirin EC 81 MG tablet Take 81 mg by mouth daily.  . Cholecalciferol (VITAMIN D) 125 MCG (5000 UT) CAPS Take 5,000 Units by mouth daily.  . Dulaglutide (TRULICITY) 1.5 BD/5.3GD SOPN Inject 1.5 mg into the skin once a week.  . escitalopram (LEXAPRO) 10 MG tablet Take 1 tablet (10 mg total) by mouth daily.  Marland Kitchen letrozole (FEMARA) 2.5 MG tablet Take 1 tablet (2.5 mg total) by mouth daily.  . Multiple Vitamin (MULTIVITAMIN) tablet Take 1 tablet by mouth 2 (two) times daily. Sales executive  . Multiple Vitamins-Minerals (BARIATRIC FUSION) CHEW Chew 1 tablet by mouth 2 (two) times daily.  . [DISCONTINUED] atorvastatin (LIPITOR) 20 MG tablet Take 1 tablet (20 mg  total) by mouth daily.    Allergies: Patient has No Known Allergies. Past Medical History Patient  has a past medical history of Anemia, Closed displaced fracture of acromial end of right clavicle (03/09/2015), Decreased bone mass (08/20/2018), Dysthymia, Rx Lexapro (02/07/2017), Essential hypertension, on Norvasc (02/07/2017), History of breast cancer, Dx 2015, ER +, HER2 -, grade 3, s/p adriamycin and radiation (02/07/2017), Hyperlipidemia, on statin (92/42/6834), Metabolic syndrome (19/07/2227), PCOS (polycystic ovarian syndrome), Personal history of chemo and radiation therapy for breast cancer, Pure hypercholesterolemia (02/07/2017), S/P laparoscopic sleeve gastrectomy, 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 Redmond Pulling (03/29/2018), SUI (stress urinary incontinence, female) (01/25/2018), Use of letrozole (Femara) (08/20/2018), and Vitamin D deficiency (02/08/2017). Past Surgical History Patient  has a past surgical history that includes Cholecystectomy; Cesarean section; Hernia repair;  Tonsillectomy; Portacath placement; Total abdominal hysterectomy w/ bilateral salpingoophorectomy; and Laparoscopic gastric sleeve resection with hiatal hernia repair (N/A, 01/25/2018). Family History: Patient family history includes Alcohol abuse in her maternal grandmother; Arthritis in her maternal grandmother; COPD in her father and maternal grandmother; Diabetes in her father and paternal grandmother; Heart disease in her father and paternal grandmother; High Cholesterol in her mother; High blood pressure in her father; Hypertension in her paternal grandmother. Social History:  Patient  reports that she has quit smoking. She quit after 5.00 years of use. She has never used smokeless tobacco. She reports current alcohol use of about 1.0 standard drinks of alcohol per week. She reports that she does not use drugs.  Review of Systems: Constitutional: negative for fever or malaise Ophthalmic: negative for photophobia, double  vision or loss of vision Cardiovascular: negative for chest pain, dyspnea on exertion, or new LE swelling Respiratory: negative for SOB or persistent cough Gastrointestinal: negative for abdominal pain, change in bowel habits or melena Genitourinary: negative for dysuria or gross hematuria Musculoskeletal: negative for new gait disturbance or muscular weakness Integumentary: negative for new or persistent rashes Neurological: negative for TIA or stroke symptoms Psychiatric: negative for SI or delusions Allergic/Immunologic: negative for hives  Patient Care Team    Relationship Specialty Notifications Start End  Leamon Arnt, MD PCP - General Family Medicine  05/06/19   Buford Dresser, MD Consulting Physician Cardiology  05/06/19     Objective  Vitals: BP 122/78 (BP Location: Right Arm, Patient Position: Sitting, Cuff Size: Large)   Pulse 78   Temp 97.8 F (36.6 C) (Temporal)   Ht 5' 7" (1.702 m)   Wt 219 lb 6.4 oz (99.5 kg)   SpO2 93%   BMI 34.36 kg/m  General:  Well developed, well nourished, no acute distress  Psych:  Alert and oriented,normal mood and affect HEENT:  Normocephalic, atraumatic, non-icteric sclera, supple neck without adenopathy, mass or thyromegaly Cardiovascular:  RRR without gallop, rub or murmur Respiratory:  Good breath sounds bilaterally, CTAB with normal respiratory effort   Commons side effects, risks, benefits, and alternatives for medications and treatment plan prescribed today were discussed, and the patient expressed understanding of the given instructions. Patient is instructed to call or message via MyChart if he/she has any questions or concerns regarding our treatment plan. No barriers to understanding were identified. We discussed Red Flag symptoms and signs in detail. Patient expressed understanding regarding what to do in case of urgent or emergency type symptoms.   Medication list was reconciled, printed and provided to the patient in  AVS. Patient instructions and summary information was reviewed with the patient as documented in the AVS. This note was prepared with assistance of Dragon voice recognition software. Occasional wrong-word or sound-a-like substitutions may have occurred due to the inherent limitations of voice recognition software  This visit occurred during the SARS-CoV-2 public health emergency.  Safety protocols were in place, including screening questions prior to the visit, additional usage of staff PPE, and extensive cleaning of exam room while observing appropriate contact time as indicated for disinfecting solutions.

## 2019-05-06 NOTE — Patient Instructions (Signed)
Please return in 3 months for for your annual complete physical; please come fasting.  Let's stop the lipitor for now. We will continue other medications as we discussed. We are starting medications for hirsutism; we will need to monitor your blood pressure since it is a diuretic.  Return for your prolia injection as scheduled.   We will call you with information regarding your referral appointment. [facility] for colonoscopy. If you do not hear from [place] within the next 2 weeks, please let me know. It can take 1-2 weeks to get appointments set up with the specialists.   It was a pleasure seeing you today! Thank you for choosing [place] to meet your healthcare needs! I truly look forward to working with you. If you have any questions or concerns, please send me a message via Mychart or call the office at [phone].

## 2019-05-25 MED FILL — ESCITALOPRAM 10 MG TABLET: 10 | 90 days supply | Qty: 90 | Fill #0

## 2019-05-25 MED FILL — TRULICITY 1.5 MG/0.5 ML PEN: 1.5 | 84 days supply | Qty: 6 | Fill #1

## 2019-06-07 ENCOUNTER — Ambulatory Visit (INDEPENDENT_AMBULATORY_CARE_PROVIDER_SITE_OTHER): Payer: 59

## 2019-06-07 ENCOUNTER — Other Ambulatory Visit: Payer: Self-pay

## 2019-06-07 DIAGNOSIS — M8580 Other specified disorders of bone density and structure, unspecified site: Secondary | ICD-10-CM

## 2019-06-07 MED ORDER — DENOSUMAB 60 MG/ML ~~LOC~~ SOSY
60.0000 mg | PREFILLED_SYRINGE | Freq: Once | SUBCUTANEOUS | Status: AC
  Administered 2019-06-07: 60 mg via SUBCUTANEOUS

## 2019-06-07 NOTE — Progress Notes (Signed)
Per orders of Dr. Jonni Sanger , injection of Prolia  given by Francella Solian in right arm. Patient tolerated injection well.

## 2019-07-08 DIAGNOSIS — Z853 Personal history of malignant neoplasm of breast: Secondary | ICD-10-CM | POA: Diagnosis not present

## 2019-07-08 DIAGNOSIS — Z9884 Bariatric surgery status: Secondary | ICD-10-CM | POA: Diagnosis not present

## 2019-07-08 DIAGNOSIS — M859 Disorder of bone density and structure, unspecified: Secondary | ICD-10-CM | POA: Diagnosis not present

## 2019-07-08 DIAGNOSIS — E7849 Other hyperlipidemia: Secondary | ICD-10-CM | POA: Diagnosis not present

## 2019-07-08 DIAGNOSIS — E8881 Metabolic syndrome: Secondary | ICD-10-CM | POA: Diagnosis not present

## 2019-07-08 DIAGNOSIS — F418 Other specified anxiety disorders: Secondary | ICD-10-CM | POA: Diagnosis not present

## 2019-07-08 DIAGNOSIS — Z1331 Encounter for screening for depression: Secondary | ICD-10-CM | POA: Diagnosis not present

## 2019-07-15 DIAGNOSIS — Z780 Asymptomatic menopausal state: Secondary | ICD-10-CM | POA: Diagnosis not present

## 2019-07-15 DIAGNOSIS — C50919 Malignant neoplasm of unspecified site of unspecified female breast: Secondary | ICD-10-CM | POA: Diagnosis not present

## 2019-08-17 MED FILL — ESCITALOPRAM 10 MG TABLET: 10 | 90 days supply | Qty: 90 | Fill #1

## 2019-08-17 MED FILL — ATORVASTATIN 20 MG TABLET: 20 | 90 days supply | Qty: 90 | Fill #0

## 2019-08-17 MED FILL — TRULICITY 1.5 MG/0.5 ML PEN: 1.5 | 84 days supply | Qty: 6 | Fill #2

## 2019-08-19 DIAGNOSIS — H524 Presbyopia: Secondary | ICD-10-CM | POA: Diagnosis not present

## 2019-08-22 ENCOUNTER — Encounter: Payer: Self-pay | Admitting: Gastroenterology

## 2019-08-23 ENCOUNTER — Encounter (HOSPITAL_COMMUNITY): Payer: Self-pay

## 2019-09-22 ENCOUNTER — Encounter: Payer: Self-pay | Admitting: Gastroenterology

## 2019-09-22 ENCOUNTER — Other Ambulatory Visit: Payer: Self-pay

## 2019-09-22 ENCOUNTER — Ambulatory Visit (AMBULATORY_SURGERY_CENTER): Payer: Self-pay | Admitting: *Deleted

## 2019-09-22 VITALS — Ht 67.0 in | Wt 218.0 lb

## 2019-09-22 DIAGNOSIS — Z1211 Encounter for screening for malignant neoplasm of colon: Secondary | ICD-10-CM

## 2019-09-22 MED ORDER — NA SULFATE-K SULFATE-MG SULF 17.5-3.13-1.6 GM/177ML PO SOLN: ORAL | 0 refills | Status: DC

## 2019-09-22 MED FILL — SUPREP BOWEL PREP KIT: 17.5-3.13-1 | 1 days supply | Qty: 354 | Fill #0

## 2019-09-22 NOTE — Progress Notes (Signed)
Patient is here in-person for PV. Patient denies any allergies to eggs or soy. Patient denies any problems with anesthesia/sedation. Patient denies any oxygen use at home. Patient denies taking any diet/weight loss medications or blood thinners. Patient is not being treated for MRSA or C-diff. Patient is aware of our care-partner policy and UHKIS-30 safety protocol. EMMI education assisgned to the patient for the procedure, this was explained and instructions given to patient.   COVID-19  05/29/19 VACCINES COMPLETED PER PT.

## 2019-10-04 ENCOUNTER — Encounter: Payer: 59 | Admitting: Gastroenterology

## 2019-10-05 ENCOUNTER — Ambulatory Visit: Payer: 59 | Admitting: Cardiology

## 2019-10-25 MED FILL — LETROZOLE 2.5 MG TABLET: 2.5 | 90 days supply | Qty: 90 | Fill #3

## 2019-10-26 DIAGNOSIS — E559 Vitamin D deficiency, unspecified: Secondary | ICD-10-CM | POA: Diagnosis not present

## 2019-10-26 DIAGNOSIS — Z0000 Encounter for general adult medical examination without abnormal findings: Secondary | ICD-10-CM | POA: Diagnosis not present

## 2019-10-26 DIAGNOSIS — E785 Hyperlipidemia, unspecified: Secondary | ICD-10-CM | POA: Diagnosis not present

## 2019-11-08 DIAGNOSIS — Z1212 Encounter for screening for malignant neoplasm of rectum: Secondary | ICD-10-CM | POA: Diagnosis not present

## 2019-11-08 DIAGNOSIS — Z23 Encounter for immunization: Secondary | ICD-10-CM | POA: Diagnosis not present

## 2019-11-08 DIAGNOSIS — F418 Other specified anxiety disorders: Secondary | ICD-10-CM | POA: Diagnosis not present

## 2019-11-08 DIAGNOSIS — Z0000 Encounter for general adult medical examination without abnormal findings: Secondary | ICD-10-CM | POA: Diagnosis not present

## 2019-11-08 DIAGNOSIS — E8881 Metabolic syndrome: Secondary | ICD-10-CM | POA: Diagnosis not present

## 2019-11-08 DIAGNOSIS — Z7689 Persons encountering health services in other specified circumstances: Secondary | ICD-10-CM | POA: Diagnosis not present

## 2019-11-08 DIAGNOSIS — Z79811 Long term (current) use of aromatase inhibitors: Secondary | ICD-10-CM | POA: Diagnosis not present

## 2019-11-08 DIAGNOSIS — Z853 Personal history of malignant neoplasm of breast: Secondary | ICD-10-CM | POA: Diagnosis not present

## 2019-11-08 DIAGNOSIS — Z1211 Encounter for screening for malignant neoplasm of colon: Secondary | ICD-10-CM | POA: Diagnosis not present

## 2019-11-08 DIAGNOSIS — E7849 Other hyperlipidemia: Secondary | ICD-10-CM | POA: Diagnosis not present

## 2019-11-10 ENCOUNTER — Other Ambulatory Visit: Payer: Self-pay

## 2019-11-10 ENCOUNTER — Ambulatory Visit (AMBULATORY_SURGERY_CENTER): Payer: 59 | Admitting: Gastroenterology

## 2019-11-10 ENCOUNTER — Encounter: Payer: 59 | Admitting: Gastroenterology

## 2019-11-10 ENCOUNTER — Encounter: Payer: Self-pay | Admitting: Gastroenterology

## 2019-11-10 VITALS — BP 127/64 | HR 58 | Temp 97.1°F | Resp 13 | Ht 67.0 in | Wt 218.0 lb

## 2019-11-10 DIAGNOSIS — D122 Benign neoplasm of ascending colon: Secondary | ICD-10-CM

## 2019-11-10 DIAGNOSIS — Z1211 Encounter for screening for malignant neoplasm of colon: Secondary | ICD-10-CM

## 2019-11-10 MED ORDER — SODIUM CHLORIDE 0.9 % IV SOLN: 500.0000 mL | Freq: Once | INTRAVENOUS | Status: DC

## 2019-11-10 NOTE — Patient Instructions (Signed)
Handout on polyps given. ° °YOU HAD AN ENDOSCOPIC PROCEDURE TODAY AT THE [facility]:   Refer to the procedure report that was given to you for any specific questions about what was found during the examination.  If the procedure report does not answer your questions, please call your gastroenterologist to clarify.  If you requested that your care partner not be given the details of your procedure findings, then the procedure report has been included in a sealed envelope for you to review at your convenience later. ° °YOU SHOULD EXPECT: Some feelings of bloating in the abdomen. Passage of more gas than usual.  Walking can help get rid of the air that was put into your GI tract during the procedure and reduce the bloating. If you had a lower endoscopy (such as a colonoscopy or flexible sigmoidoscopy) you may notice spotting of blood in your stool or on the toilet paper. If you underwent a bowel prep for your procedure, you may not have a normal bowel movement for a few days. ° °Please Note:  You might notice some irritation and congestion in your nose or some drainage.  This is from the oxygen used during your procedure.  There is no need for concern and it should clear up in a day or so. ° °SYMPTOMS TO REPORT IMMEDIATELY: ° °Following lower endoscopy (colonoscopy or flexible sigmoidoscopy): ° Excessive amounts of blood in the stool ° Significant tenderness or worsening of abdominal pains ° Swelling of the abdomen that is new, acute ° Fever of 100°F or higher ° °For urgent or emergent issues, a gastroenterologist can be reached at any hour by calling [phone]. °Do not use MyChart messaging for urgent concerns.  ° ° °DIET:  We do recommend a small meal at first, but then you may proceed to your regular diet.  Drink plenty of fluids but you should avoid alcoholic beverages for 24 hours. ° °ACTIVITY:  You should plan to take it easy for the rest of today and you should NOT DRIVE or use heavy machinery  until tomorrow (because of the sedation medicines used during the test).   ° °FOLLOW UP: °Our staff will call the number listed on your records 48-72 hours following your procedure to check on you and address any questions or concerns that you may have regarding the information given to you following your procedure. If we do not reach you, we will leave a message.  We will attempt to reach you two times.  During this call, we will ask if you have developed any symptoms of COVID 19. If you develop any symptoms (ie: fever, flu-like symptoms, shortness of breath, cough etc.) before then, please call [phone].  If you test positive for Covid 19 in the 2 weeks post procedure, please call and report this information to us.   ° °If any biopsies were taken you will be contacted by phone or by letter within the next 1-3 weeks.  Please call us at [phone] if you have not heard about the biopsies in 3 weeks.  ° ° °SIGNATURES/CONFIDENTIALITY: °You and/or your care partner have signed paperwork which will be entered into your electronic medical record.  These signatures attest to the fact that that the information above on your After Visit Summary has been reviewed and is understood.  Full responsibility of the confidentiality of this discharge information lies with you and/or your care-partner.  °

## 2019-11-10 NOTE — Progress Notes (Signed)
Called to room to assist during endoscopic procedure.  Patient ID and intended procedure confirmed with present staff. Received instructions for my participation in the procedure from the performing physician.  

## 2019-11-10 NOTE — Op Note (Signed)
[facility] Patient Name: Alexis Chase Procedure Date: 11/10/2019 7:14 AM MRN: [id number] Endoscopist: Mallie Mussel L. Loletha Carrow , MD Age: 49 Referring MD:  Date of Birth: [birth year] Gender: Female Account #: [id number] Procedure:                Colonoscopy Indications:              Screening for colorectal malignant neoplasm, This                            is the patient's first colonoscopy Medicines:                Monitored Anesthesia Care Procedure:                Pre-Anesthesia Assessment:                           - Prior to the procedure, a History and Physical                            was performed, and patient medications and                            allergies were reviewed. The patient's tolerance of                            previous anesthesia was also reviewed. The risks                            and benefits of the procedure and the sedation                            options and risks were discussed with the patient.                            All questions were answered, and informed consent                            was obtained. Prior Anticoagulants: The patient has                            taken no previous anticoagulant or antiplatelet                            agents except for aspirin. ASA Grade Assessment: II                            - A patient with mild systemic disease. After                            reviewing the risks and benefits, the patient was                            deemed in satisfactory condition to undergo the  procedure.                           After obtaining informed consent, the colonoscope                            was passed under direct vision. Throughout the                            procedure, the patient's blood pressure, pulse, and                            oxygen saturations were monitored continuously. The                            Colonoscope was introduced through the anus and                             advanced to the the cecum, identified by                            appendiceal orifice and ileocecal valve. The                            colonoscopy was performed without difficulty. The                            patient tolerated the procedure well. The quality                            of the bowel preparation was excellent. The                            ileocecal valve, appendiceal orifice, and rectum                            were photographed. The bowel preparation used was                            SUPREP. Scope In: 8:09:22 AM Scope Out: 8:32:30 AM Scope Withdrawal Time: 0 hours 17 minutes 15 seconds  Total Procedure Duration: 0 hours 23 minutes 8 seconds  Findings:                 The perianal and digital rectal examinations were                            normal.                           A 4 mm polyp was found in the hepatic flexure. The                            polyp was sessile. The polyp was removed with a  cold snare. Resection and retrieval were complete.                           The exam was otherwise without abnormality on                            direct and retroflexion views. Complications:            No immediate complications. Estimated Blood Loss:     Estimated blood loss was minimal. Impression:               - One 4 mm polyp at the hepatic flexure, removed                            with a cold snare. Resected and retrieved.                           - The examination was otherwise normal on direct                            and retroflexion views. Recommendation:           - Patient has a contact number available for                            emergencies. The signs and symptoms of potential                            delayed complications were discussed with the                            patient. Return to normal activities tomorrow.                            Written discharge instructions were provided to  the                            patient.                           - Resume previous diet.                           - Continue present medications.                           - Await pathology results.                           - Repeat colonoscopy is recommended for                            surveillance. The colonoscopy date will be                            determined after pathology results from today's  exam become available for review. Erva Koke L. Loletha Carrow, MD 11/10/2019 8:36:50 AM This report has been signed electronically.

## 2019-11-10 NOTE — Progress Notes (Signed)
To PACU, VSS. Report to [email or web address] 

## 2019-11-14 ENCOUNTER — Telehealth: Payer: Self-pay | Admitting: *Deleted

## 2019-11-14 NOTE — Telephone Encounter (Signed)
Attempted 2nd f/u phone call. No answer. Left message.  °

## 2019-11-14 NOTE — Telephone Encounter (Signed)
Attempted f/u phone call. No answer. Left message. °

## 2019-11-16 ENCOUNTER — Encounter: Payer: Self-pay | Admitting: Gastroenterology

## 2019-11-17 ENCOUNTER — Encounter: Payer: Self-pay | Admitting: Cardiology

## 2019-11-17 ENCOUNTER — Other Ambulatory Visit: Payer: Self-pay

## 2019-11-17 ENCOUNTER — Ambulatory Visit (INDEPENDENT_AMBULATORY_CARE_PROVIDER_SITE_OTHER): Payer: 59 | Admitting: Cardiology

## 2019-11-17 VITALS — BP 104/75 | HR 78 | Ht 67.0 in | Wt 215.8 lb

## 2019-11-17 DIAGNOSIS — E669 Obesity, unspecified: Secondary | ICD-10-CM

## 2019-11-17 DIAGNOSIS — I10 Essential (primary) hypertension: Secondary | ICD-10-CM

## 2019-11-17 DIAGNOSIS — E7800 Pure hypercholesterolemia, unspecified: Secondary | ICD-10-CM | POA: Diagnosis not present

## 2019-11-17 DIAGNOSIS — Z7189 Other specified counseling: Secondary | ICD-10-CM

## 2019-11-17 DIAGNOSIS — Z853 Personal history of malignant neoplasm of breast: Secondary | ICD-10-CM | POA: Diagnosis not present

## 2019-11-17 NOTE — Patient Instructions (Addendum)
Medication Instructions:  Your physician recommends that you continue on your current medications as directed. Please refer to the Current Medication list given to you today.  Follow-Up: At [facility], you and your health needs are our priority.  As part of our continuing mission to provide you with exceptional heart care, we have created designated Provider Care Teams.  These Care Teams include your primary Cardiologist (physician) and Advanced Practice Providers (APPs -  Physician Assistants and Nurse Practitioners) who all work together to provide you with the care you need, when you need it.  We recommend signing up for the patient portal called "MyChart".  Sign up information is provided on this After Visit Summary.  MyChart is used to connect with patients for Virtual Visits (Telemedicine).  Patients are able to view lab/test results, encounter notes, upcoming appointments, etc.  Non-urgent messages can be sent to your provider as well.   To learn more about what you can do with MyChart, go to [email or web address].    Your next appointment:   12 month(s)  The format for your next appointment:   In Person  Provider:   You may see Dr. Harrell Gave or one of the following Advanced Practice Providers on your designated Care Team:    Rosaria Ferries, PA-C  Jory Sims, DNP, ANP    Other Instructions

## 2019-11-17 NOTE — Progress Notes (Signed)
Cardiology Office Note:    Date:  11/17/2019   ID:  Alexis Chase, DOB 1970/03/15, MRN [id number]  PCP:  Alexis Boston, MD  Cardiologist:  Alexis Dresser, MD PhD  Referring MD: Alexis Arnt, MD   CC: Follow up  History of Present Illness:    Alexis Chase is a 49 y.o. female with a hx of breast cancer in remission, obesity, hypertension who is seen for follow up today. She has a past medical history of chemotherapy with adriamycin, HTN, and HLD, as well as a remote history of tobacco use, as risk factors. She recently underwent bariatric surgery in 01/2018.  Breast cancer history: in 2015, L breast cancer treated with neoadjuvant chemotherapy followed by lumpectomy and radiation, then chemotherapy. Follows up in [employer], [place] with oncology.   Today: Doing well. Recent scans were clear. Discussed metabolic syndrome, management, evaluation.   Reviewed lipids from [facility]. Collected 10/26/19. Tchol 147, TG 92, HDL 58, LDL 71.   Discussed exercise and diet guideline recommendations today.  Questions today re: statins, dementia risk, breast cancer. Interested in natural therapies as well.   Denies chest pain, shortness of breath at rest or with normal exertion. No PND, orthopnea, LE edema or unexpected weight gain. No syncope or palpitations.   Past Medical History:  Diagnosis Date  . Anemia   . Cancer [facility]) 2015   breast cancer   . Closed displaced fracture of acromial end of right clavicle 03/09/2015  . Decreased bone mass 08/20/2018  . Dysthymia, Rx Lexapro 02/07/2017  . Essential hypertension, on Norvasc 02/07/2017  . History of breast cancer, Dx 2015, ER +, HER2 -, grade 3, s/p adriamycin and radiation 02/07/2017  . Hyperlipidemia, on statin 02/07/2017  . Metabolic syndrome 21/02/9415  . PCOS (polycystic ovarian syndrome)   . Personal history of chemo and radiation therapy for breast cancer   . Pure hypercholesterolemia 02/07/2017  . S/P laparoscopic sleeve gastrectomy,  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 Redmond Chase 03/29/2018  . SUI (stress urinary incontinence, female) 01/25/2018  . Use of letrozole (Femara) 08/20/2018  . Vitamin D deficiency 02/08/2017    Past Surgical History:  Procedure Laterality Date  . CESAREAN SECTION    . CHOLECYSTECTOMY    . HERNIA REPAIR    . LAPAROSCOPIC GASTRIC SLEEVE RESECTION WITH HIATAL HERNIA REPAIR N/A 01/25/2018   Procedure: LAPAROSCOPIC GASTRIC SLEEVE RESECTION WITH HIATAL HERNIA REPAIR, UPPER ENDO AND ERAS PATHWAY;  Surgeon: Alexis Pickerel, MD;  Location: [facility];  Service: General;  Laterality: N/A;  . PORTACATH PLACEMENT    . TONSILLECTOMY    . TOTAL ABDOMINAL HYSTERECTOMY W/ BILATERAL SALPINGOOPHORECTOMY      Current Medications: Current Outpatient Medications on File Prior to Visit  Medication Sig  . aspirin EC 81 MG tablet Take 81 mg by mouth daily.  Marland Kitchen atorvastatin (LIPITOR) 20 MG tablet Take 20 mg by mouth daily.  . Calcium 500-100 MG-UNIT CHEW Chew 3 each by mouth daily.  . Cholecalciferol (VITAMIN D) 125 MCG (5000 UT) CAPS Take 5,000 Units by mouth daily.  Marland Kitchen denosumab (PROLIA) 60 MG/ML SOSY injection Inject 60 mg into the skin every 6 (six) months.  . Dulaglutide (TRULICITY) 1.5 EY/8.1KG SOPN Inject 1.5 mg into the skin once a week.  . escitalopram (LEXAPRO) 10 MG tablet Take 1 tablet (10 mg total) by mouth daily.  Marland Kitchen letrozole (FEMARA) 2.5 MG tablet Take 1 tablet (2.5 mg total) by mouth daily.  . Multiple Vitamin (MULTIVITAMIN) tablet Take 1 tablet by  mouth 2 (two) times daily. Sales executive  . Probiotic Product (PROBIOTIC DAILY PO) Take 1 tablet by mouth daily.   No current facility-administered medications on file prior to visit.     Allergies:   Patient has no known allergies.   Social History   Tobacco Use  . Smoking status: Former Smoker    Years: 5.00    Types: Cigarettes  . Smokeless tobacco: Never Used  . Tobacco comment: intermittently; socially   Vaping Use  . Vaping Use: Never used   Substance Use Topics  . Alcohol use: Yes    Alcohol/week: 2.0 standard drinks    Types: 2 Glasses of wine per week    Comment: once a week   . Drug use: Never  husband is OB/Gyn  Family History: The patient's family history includes Alcohol abuse in her maternal grandmother; Arthritis in her maternal grandmother; COPD in her father and maternal grandmother; Colon polyps in her father and mother; Diabetes in her father and paternal grandmother; Heart disease in her father and paternal grandmother; High Cholesterol in her mother; High blood pressure in her father; Hypertension in her paternal grandmother. There is no history of Colon cancer, Esophageal cancer, Rectal cancer, or Stomach cancer.  ROS:   Please see the history of present illness.  Additional pertinent ROS otherwise unremarkable  EKGs/Labs/Other Studies Reviewed:    The following studies were reviewed today:  Echo 10/09/17 Left ventricle: The cavity size was normal. Wall thickness was   increased in a pattern of mild LVH. Systolic function was normal.   The estimated ejection fraction was in the range of 60% to 65%.   Wall motion was normal; there were no regional wall motion   abnormalities. Left ventricular diastolic function parameters   were normal. GLS: -20.1%   Care Everywhere echo from 08/03/2014 Final Impressions: 1. There are no significant chamber enlargements noted. 2. Global left ventricular wall motion and contractility are within normal limits. 3. The estimated ejection fraction is (+/-) 65.74% by 2D.  4. Normal left ventricular diastolic filling is observed. 5. The right ventricular systolic function is within normal limits. 6. All four cardiac valves appear normal in structure and function. 7. The inferior vena cava appearance suggests normal right ventricle filling pressures. 8. There is no pericardial effusion.  Echo limited 08/03/2015 Final Impressions: 1. This is a limited echo to reassess EF;  patient has history of chemotherapy. 2. The left ventricular chamber size is normal. 3. Global left ventricular wall motion and contractility are within normal limits. The estimated ejection fraction is 60-65%. 4. There is no pericardial effusion. 5. The inferior vena cava appears normal in size; and there is a greater than 50% respiratory change in the inferior vena cava dimension. 6. There is a greater than 50% respiratory change in the inferior vena cava dimension.  EKG:  EKG is ordered today.  The ekg ordered today demonstrates normal sinus rhythm at 78 bpm  Recent Labs: No results found for requested labs within last 8760 hours.  Recent Lipid Panel    Component Value Date/Time   CHOL 156 08/24/2018 0903   TRIG 171.0 (H) 08/24/2018 0903   HDL 49.90 08/24/2018 0903   CHOLHDL 3 08/24/2018 0903   VLDL 34.2 08/24/2018 0903   LDLCALC 72 08/24/2018 0903   LDLCALC 81 02/06/2017 1522    Physical Exam:    VS:  BP 104/75   Pulse 78   Ht $R'5\' 7"'zS$  (1.702 m)   Wt 215 lb 12.8  oz (97.9 kg)   SpO2 98%   BMI 33.80 kg/m     Wt Readings from Last 3 Encounters:  11/17/19 215 lb 12.8 oz (97.9 kg)  11/10/19 218 lb (98.9 kg)  09/22/19 (!) 218 lb (98.9 kg)    GEN: Well nourished, well developed in no acute distress HEENT: Normal, moist mucous membranes NECK: No JVD CARDIAC: regular rhythm, normal S1 and S2, no rubs or gallops. No murmur. VASCULAR: Radial and DP pulses 2+ bilaterally. No carotid bruits RESPIRATORY:  Clear to auscultation without rales, wheezing or rhonchi  ABDOMEN: Soft, non-tender, non-distended MUSCULOSKELETAL:  Ambulates independently SKIN: Warm and dry, no edema NEUROLOGIC:  Alert and oriented x 3. No focal neuro deficits noted. PSYCHIATRIC:  Normal affect   ASSESSMENT:    1. Obesity (BMI 30-39.9)   2. Essential hypertension   3. Pure hypercholesterolemia   4. History of breast cancer, ER +, HER2 -, grade 3, s/p chemo and radiation. 3 year anniversery recently    5. Cardiac risk counseling   6. Counseling on health promotion and disease prevention    PLAN:    Obesity and metabolic syndrome, now s/p sleeve gastrectomy 01/2018:  -we discussed diet and exercise from a cardiovascular perspective today -see below re: HTN and lipids  Hypertension: well controlled today.  -now off all antihypertensives  Hyperlipidemia, history of type II diabetes:  -she is well educated on the literature. Discussed the available data on statins and dementia, breast cancer -continue atorvastatin, aspirin given diabetes. On GLP1RA as well  History of breast cancer, with radiation and adriamycin:  -doing well, echo with normal strain measurements 09/2017  CV prevention recommendations -recommend heart healthy/Mediterranean diet, with whole grains, fruits, vegetable, fish, lean meats, nuts, and olive oil. Limit salt. -recommend moderate walking, 3-5 times/week for 30-50 minutes each session. Aim for at least 150 minutes.week. Goal should be pace of 3 miles/hours, or walking 1.5 miles in 30 minutes -recommend avoidance of tobacco products. Avoid excess alcohol. -ASCVD risk score: The 10-year ASCVD risk score Denman George DC Montez Hageman., et al., 2013) is: 1.8%   Values used to calculate the score:     Age: 54 years     Sex: Female     Is Non-Hispanic African American: No     Diabetic: Yes     Tobacco smoker: No     Systolic Blood Pressure: 125 mmHg     Is BP treated: No     HDL Cholesterol: 49.9 mg/dL     Total Cholesterol: 156 mg/dL   Plan for follow up: 1 year or sooner as needed  Jodelle Red, MD, PhD [place]  [facility]   Medication Adjustments/Labs and Tests Ordered: Current medicines are reviewed at length with the patient today.  Concerns regarding medicines are outlined above.  Orders Placed This Encounter  Procedures  . EKG 12-Lead   No orders of the defined types were placed in this encounter.   Patient Instructions  Medication  Instructions:  Your physician recommends that you continue on your current medications as directed. Please refer to the Current Medication list given to you today.  Follow-Up: At [facility], you and your health needs are our priority.  As part of our continuing mission to provide you with exceptional heart care, we have created designated Provider Care Teams.  These Care Teams include your primary Cardiologist (physician) and Advanced Practice Providers (APPs -  Physician Assistants and Nurse Practitioners) who all work together to provide you with the care you  need, when you need it.  We recommend signing up for the patient portal called "MyChart".  Sign up information is provided on this After Visit Summary.  MyChart is used to connect with patients for Virtual Visits (Telemedicine).  Patients are able to view lab/test results, encounter notes, upcoming appointments, etc.  Non-urgent messages can be sent to your provider as well.   To learn more about what you can do with MyChart, go to [email or web address].    Your next appointment:   12 month(s)  The format for your next appointment:   In Person  Provider:   You may see Dr. Harrell Gave or one of the following Advanced Practice Providers on your designated Care Team:    Rosaria Ferries, PA-C  Jory Sims, DNP, ANP    Other Instructions       Signed, Alexis Dresser, MD PhD 11/17/2019  [place]

## 2019-11-25 DIAGNOSIS — C50919 Malignant neoplasm of unspecified site of unspecified female breast: Secondary | ICD-10-CM | POA: Diagnosis not present

## 2019-12-19 ENCOUNTER — Other Ambulatory Visit: Payer: Self-pay | Admitting: Family Medicine

## 2019-12-19 ENCOUNTER — Other Ambulatory Visit (HOSPITAL_COMMUNITY): Payer: Self-pay | Admitting: Internal Medicine

## 2019-12-19 DIAGNOSIS — E8881 Metabolic syndrome: Secondary | ICD-10-CM

## 2019-12-19 DIAGNOSIS — F341 Dysthymic disorder: Secondary | ICD-10-CM

## 2019-12-19 MED FILL — ATORVASTATIN CALCIUM 20 MG: 20 | 90 days supply | Qty: 90 | Fill #0

## 2019-12-19 MED FILL — ESCITALOPRAM 10 MG TABLET: 10 | 90 days supply | Qty: 90 | Fill #0

## 2019-12-19 MED FILL — TRULICITY 1.5 MG/0.5 ML PEN: 1.5 | 84 days supply | Qty: 6 | Fill #0

## 2020-01-11 ENCOUNTER — Other Ambulatory Visit: Payer: Self-pay | Admitting: Nurse Practitioner

## 2020-01-11 ENCOUNTER — Ambulatory Visit (HOSPITAL_COMMUNITY)
Admission: RE | Admit: 2020-01-11 | Discharge: 2020-01-11 | Disposition: A | Discharge status: Home / Self Care | Payer: 59 | Source: Ambulatory Visit | Attending: Pulmonary Disease | Admitting: Pulmonary Disease

## 2020-01-11 DIAGNOSIS — Z6825 Body mass index (BMI) 25.0-25.9, adult: Secondary | ICD-10-CM | POA: Diagnosis not present

## 2020-01-11 DIAGNOSIS — U071 COVID-19: Secondary | ICD-10-CM | POA: Diagnosis not present

## 2020-01-11 DIAGNOSIS — E669 Obesity, unspecified: Secondary | ICD-10-CM

## 2020-01-11 DIAGNOSIS — R059 Cough, unspecified: Secondary | ICD-10-CM | POA: Diagnosis not present

## 2020-01-11 MED ORDER — SODIUM CHLORIDE 0.9 % IV SOLN: INTRAVENOUS | Status: DC | PRN

## 2020-01-11 MED ORDER — DIPHENHYDRAMINE HCL 50 MG/ML IJ SOLN: 50.0000 mg | Freq: Once | INTRAMUSCULAR | Status: DC | PRN

## 2020-01-11 MED ORDER — METHYLPREDNISOLONE SODIUM SUCC 125 MG IJ SOLR: 125.0000 mg | Freq: Once | INTRAMUSCULAR | Status: DC | PRN

## 2020-01-11 MED ORDER — EPINEPHRINE 0.3 MG/0.3ML IJ SOAJ: 0.3000 mg | Freq: Once | INTRAMUSCULAR | Status: DC | PRN

## 2020-01-11 MED ORDER — ALBUTEROL SULFATE HFA 108 (90 BASE) MCG/ACT IN AERS: 2.0000 | INHALATION_SPRAY | Freq: Once | RESPIRATORY_TRACT | Status: DC | PRN

## 2020-01-11 MED ORDER — FAMOTIDINE IN NACL 20-0.9 MG/50ML-% IV SOLN: 20.0000 mg | Freq: Once | INTRAVENOUS | Status: DC | PRN

## 2020-01-11 MED ORDER — SOTROVIMAB 500 MG/8ML IV SOLN
500.0000 mg | Freq: Once | INTRAVENOUS | Status: AC
  Administered 2020-01-11: 500 mg via INTRAVENOUS

## 2020-01-11 NOTE — Progress Notes (Signed)
Diagnosis: COVID-19  Physician: Dr. Joya Gaskins  Procedure: Reviewed administrative charges of treatment with the patient, as well as common side-effects of therapy; pt indicates understanding and willingness to proceed with treatment.  Medication fact sheet provided to patient; all questions answered.  Allergies reviewed with patient.  IV placed.  Sotrovimab administered via IV infusion.  Complications: No immediate complications noted.  Discharge: Discharged home   Alexis Chase 01/11/2020

## 2020-01-11 NOTE — Progress Notes (Signed)
I connected by phone with Alexis Chase on 01/11/2020 at 11:01 AM to discuss the potential use of a new treatment for mild to moderate COVID-19 viral infection in non-hospitalized patients.  This patient is a 49 y.o. female that meets the FDA criteria for Emergency Use Authorization of COVID monoclonal antibody casirivimab/imdevimab, bamlanivimab/eteseviamb, or sotrovimab.  Has a (+) direct SARS-CoV-2 viral test result  Has mild or moderate COVID-19   Is NOT hospitalized due to COVID-19  Is within 10 days of symptom onset  Has at least one of the high risk factor(s) for progression to severe COVID-19 and/or hospitalization as defined in EUA.  Specific high risk criteria : BMI > 25   I have spoken and communicated the following to the patient or parent/caregiver regarding COVID monoclonal antibody treatment:  1. FDA has authorized the emergency use for the treatment of mild to moderate COVID-19 in adults and pediatric patients with positive results of direct SARS-CoV-2 viral testing who are 82 years of age and older weighing at least 40 kg, and who are at high risk for progressing to severe COVID-19 and/or hospitalization.  2. The significant known and potential risks and benefits of COVID monoclonal antibody, and the extent to which such potential risks and benefits are unknown.  3. Information on available alternative treatments and the risks and benefits of those alternatives, including clinical trials.  4. Patients treated with COVID monoclonal antibody should continue to self-isolate and use infection control measures (e.g., wear mask, isolate, social distance, avoid sharing personal items, clean and disinfect "high touch" surfaces, and frequent handwashing) according to CDC guidelines.   5. The patient or parent/caregiver has the option to accept or refuse COVID monoclonal antibody treatment.  After reviewing this information with the patient, the patient has agreed to receive one  of the available covid 19 monoclonal antibodies and will be provided an appropriate fact sheet prior to infusion. Fenton Foy, NP 01/11/2020 11:01 AM

## 2020-01-11 NOTE — Discharge Instructions (Signed)

## 2020-02-05 ENCOUNTER — Encounter: Payer: Self-pay | Admitting: Cardiology

## 2020-02-09 DIAGNOSIS — I10 Essential (primary) hypertension: Secondary | ICD-10-CM | POA: Diagnosis not present

## 2020-02-09 DIAGNOSIS — G43909 Migraine, unspecified, not intractable, without status migrainosus: Secondary | ICD-10-CM | POA: Diagnosis not present

## 2020-02-09 DIAGNOSIS — E663 Overweight: Secondary | ICD-10-CM | POA: Diagnosis not present

## 2020-02-09 DIAGNOSIS — E669 Obesity, unspecified: Secondary | ICD-10-CM | POA: Diagnosis not present

## 2020-02-09 DIAGNOSIS — E46 Unspecified protein-calorie malnutrition: Secondary | ICD-10-CM | POA: Diagnosis not present

## 2020-02-09 DIAGNOSIS — F419 Anxiety disorder, unspecified: Secondary | ICD-10-CM | POA: Diagnosis not present

## 2020-02-09 DIAGNOSIS — Z713 Dietary counseling and surveillance: Secondary | ICD-10-CM | POA: Diagnosis not present

## 2020-02-09 DIAGNOSIS — C50912 Malignant neoplasm of unspecified site of left female breast: Secondary | ICD-10-CM | POA: Diagnosis not present

## 2020-02-09 DIAGNOSIS — F329 Major depressive disorder, single episode, unspecified: Secondary | ICD-10-CM | POA: Diagnosis not present

## 2020-02-09 DIAGNOSIS — E7800 Pure hypercholesterolemia, unspecified: Secondary | ICD-10-CM | POA: Diagnosis not present

## 2020-02-10 ENCOUNTER — Other Ambulatory Visit (HOSPITAL_COMMUNITY): Payer: Self-pay | Admitting: Internal Medicine

## 2020-02-10 MED FILL — LETROZOLE 2.5 MG TABLET: 2.5 | 90 days supply | Qty: 90 | Fill #0

## 2020-02-14 DIAGNOSIS — C50919 Malignant neoplasm of unspecified site of unspecified female breast: Secondary | ICD-10-CM | POA: Diagnosis not present

## 2020-02-20 DIAGNOSIS — E46 Unspecified protein-calorie malnutrition: Secondary | ICD-10-CM | POA: Diagnosis not present

## 2020-02-20 DIAGNOSIS — E7800 Pure hypercholesterolemia, unspecified: Secondary | ICD-10-CM | POA: Diagnosis not present

## 2020-02-20 DIAGNOSIS — G43909 Migraine, unspecified, not intractable, without status migrainosus: Secondary | ICD-10-CM | POA: Diagnosis not present

## 2020-02-20 DIAGNOSIS — E669 Obesity, unspecified: Secondary | ICD-10-CM | POA: Diagnosis not present

## 2020-02-20 DIAGNOSIS — I10 Essential (primary) hypertension: Secondary | ICD-10-CM | POA: Diagnosis not present

## 2020-02-20 DIAGNOSIS — C50912 Malignant neoplasm of unspecified site of left female breast: Secondary | ICD-10-CM | POA: Diagnosis not present

## 2020-02-20 DIAGNOSIS — Z713 Dietary counseling and surveillance: Secondary | ICD-10-CM | POA: Diagnosis not present

## 2020-02-20 DIAGNOSIS — K449 Diaphragmatic hernia without obstruction or gangrene: Secondary | ICD-10-CM | POA: Diagnosis not present

## 2020-03-30 DIAGNOSIS — C50912 Malignant neoplasm of unspecified site of left female breast: Secondary | ICD-10-CM | POA: Diagnosis not present

## 2020-03-30 DIAGNOSIS — Z713 Dietary counseling and surveillance: Secondary | ICD-10-CM | POA: Diagnosis not present

## 2020-04-06 MED FILL — ESCITALOPRAM 10 MG TABLET: 10 | 90 days supply | Qty: 90 | Fill #1

## 2020-04-06 MED FILL — ATORVASTATIN CALCIUM 20 MG: 20 | 90 days supply | Qty: 90 | Fill #1

## 2020-04-06 MED FILL — TRULICITY 1.5 MG/0.5 ML PEN: 1.5 | 84 days supply | Qty: 6 | Fill #1

## 2020-05-15 ENCOUNTER — Other Ambulatory Visit (HOSPITAL_BASED_OUTPATIENT_CLINIC_OR_DEPARTMENT_OTHER): Payer: Self-pay

## 2020-05-23 ENCOUNTER — Other Ambulatory Visit (HOSPITAL_COMMUNITY): Payer: Self-pay | Admitting: Internal Medicine

## 2020-05-23 DIAGNOSIS — Z79811 Long term (current) use of aromatase inhibitors: Secondary | ICD-10-CM | POA: Diagnosis not present

## 2020-05-23 DIAGNOSIS — E785 Hyperlipidemia, unspecified: Secondary | ICD-10-CM | POA: Diagnosis not present

## 2020-05-23 DIAGNOSIS — Z853 Personal history of malignant neoplasm of breast: Secondary | ICD-10-CM | POA: Diagnosis not present

## 2020-05-23 DIAGNOSIS — E669 Obesity, unspecified: Secondary | ICD-10-CM | POA: Diagnosis not present

## 2020-05-23 DIAGNOSIS — F418 Other specified anxiety disorders: Secondary | ICD-10-CM | POA: Diagnosis not present

## 2020-05-23 DIAGNOSIS — E8881 Metabolic syndrome: Secondary | ICD-10-CM | POA: Diagnosis not present

## 2020-05-23 MED FILL — LETROZOLE 2.5 MG TABLET: 2.5 | 90 days supply | Qty: 90 | Fill #1

## 2020-05-23 MED FILL — TRULICITY 3 MG/0.5ML SOPN: 3 | 84 days supply | Qty: 6 | Fill #0

## 2020-05-24 ENCOUNTER — Other Ambulatory Visit (HOSPITAL_COMMUNITY): Payer: Self-pay | Admitting: Internal Medicine

## 2020-05-24 ENCOUNTER — Other Ambulatory Visit: Payer: Self-pay | Admitting: Internal Medicine

## 2020-05-24 ENCOUNTER — Telehealth: Payer: Self-pay | Admitting: Pharmacist

## 2020-05-24 DIAGNOSIS — M8580 Other specified disorders of bone density and structure, unspecified site: Secondary | ICD-10-CM

## 2020-05-24 NOTE — Telephone Encounter (Signed)
Called patient to schedule an appointment for the [employer] [facility]. I was unable to reach the patient so I left a HIPAA-compliant message requesting that the patient return my call.   Luke Van Ausdall, PharmD, BCACP, CPP Clinical Pharmacist [facility] [phone]  

## 2020-05-29 ENCOUNTER — Other Ambulatory Visit: Payer: Self-pay

## 2020-05-29 ENCOUNTER — Other Ambulatory Visit (HOSPITAL_COMMUNITY): Payer: Self-pay

## 2020-05-29 ENCOUNTER — Ambulatory Visit: Payer: 59 | Attending: Family Medicine | Admitting: Pharmacist

## 2020-05-29 DIAGNOSIS — Z79899 Other long term (current) drug therapy: Secondary | ICD-10-CM

## 2020-05-29 MED ORDER — DENOSUMAB 60 MG/ML ~~LOC~~ SOSY
60.0000 mg | PREFILLED_SYRINGE | SUBCUTANEOUS | 1 refills | Status: AC
  Filled 2020-05-29 – 2020-06-21 (×3): qty 1, 180d supply, fill #0
  Filled 2020-12-05: qty 1, 180d supply, fill #1

## 2020-05-29 NOTE — Progress Notes (Signed)
S:  Patient presents for review of their specialty medication therapy.  Patient is currently taking Prolia for osteoporosis. Patient is managed by Dr. Jacalyn Lefevre for this. She is a cancer survivor. She takes letrozole and developed osteoporosis. Results per BMD scan done in 06/2019.  Adherence: confirmed   Efficacy: reports that it works well  Dosing: 60mg  subq every 6 months  Dose adjustments: Renal: Monitor patients with severe impairment (CrCl <30 mL/minute or on dialysis) closely, as significant and prolonged hypocalcemia (incidence of 29% and potentially lasting weeks to months) and marked elevations of serum parathyroid hormone are serious risks in this population. Ensure adequate calcium and vitamin D intake/supplementation. CrCl ?30 mL/minute: No dosage adjustment necessary. CrCl <30 mL/minute: No dosage adjustment necessary; use in conjunction with guidance from patient's nephrology team. Hepatic: no dose adjustments (has not been studied)  Drug-drug interactions: none identified   Screening: TB test: completed  Hepatitis: completed   Monitoring: S/sx of infection: none  S/sx of hypersensitivity: none  S/sx of hypocalcemia/hypercalcemia: none  Dermatitis/skin rash: none  Peripheral edema: none  HA: none  GI upset:   Other side effects: none   Last bone density study: 06/2019   O:      Lab Results  Component Value Date   WBC 4.4 08/24/2018   HGB 13.4 08/24/2018   HCT 39.1 08/24/2018   MCV 95.0 08/24/2018   PLT 209.0 08/24/2018      Chemistry      Component Value Date/Time   NA 142 08/24/2018 0903   K 4.3 08/24/2018 0903   CL 105 08/24/2018 0903   CO2 28 08/24/2018 0903   BUN 14 08/24/2018 0903   CREATININE 0.83 08/24/2018 0903   CREATININE 0.99 02/06/2017 1522      Component Value Date/Time   CALCIUM 9.3 08/24/2018 0903   ALKPHOS 106 08/24/2018 0903   AST 18 08/24/2018 0903   ALT 17 08/24/2018 0903   BILITOT 0.5 08/24/2018 0903       A/P: 1.  Medication review: Patient currently on Prolia for osteoporosis. Reviewed the medication with the patient, including the following: Prolia (denosumab) is a monoclonal antibody with affinity for nuclear factor-kappa ligand (RANKL). Prolia binds to RANKL and prevents osteoclast formation, leading to decreased bone resorption and increased bone mass in osteoporosis. Patient educated on purpose, proper use, and potential adverse effects of Prolia. The most common adverse effects are hypersensitivities, peripheral edema, dermatitis/skin rash, GI upset, HA, joint pain, and infection. There is the possibility of atypical femur fracture, serum calcium disturbances, and osteonecrosis of the jaw. Patients should monitor for and report hip, thigh, or groin pain. Additionally, patients should monitor for and report jaw pain, tooth/periodontal infection, toothache, and/or gingival ulceration/erosion. Prolia exists as a solution prefilled syringe for SQ administration. Administration: Denosumab is intended for SubQ route only and should not be administered IV, IM, or intradermally. Prior to administration, bring to room temperature in original container (allow to stand ~15 to 30 minutes); do not warm by any other method. Solution may contain trace amounts of translucent to white protein particles; do not use if cloudy, discolored (normal solution should be clear and colorless to pale yellow), or contains excessive particles or foreign matter. Avoid vigorous shaking. Administer via SubQ injection in the upper arm, upper thigh, or abdomen; should only be administered by a health care professional. No recommendations for any changes at this time.   Benard Halsted, PharmD, [place], [place] [phone]

## 2020-05-30 ENCOUNTER — Ambulatory Visit: Payer: 59 | Admitting: Pharmacist

## 2020-05-30 ENCOUNTER — Other Ambulatory Visit (HOSPITAL_COMMUNITY): Payer: Self-pay

## 2020-05-30 IMAGING — RF DG UGI W/ KUB
10 of 13 series · 12 of 24 positions shown · non-contrast
Comparison: None.

CLINICAL DATA: Preop bariatric surgery

EXAM:
UPPER GI SERIES WITH KUB
TECHNIQUE: After obtaining a scout radiograph a routine upper GI series was
performed using thin barium
FLUOROSCOPY TIME:  Fluoroscopy Time:  1.2 minutes
Radiation Exposure Index (if provided by the fluoroscopic device):
63.4 mGy
Number of Acquired Spot Images: 0

[Series 3: cp_standard · 0.54mm/px · 2 of 218 frames shown (1 of 10)]
[frame 8/218]
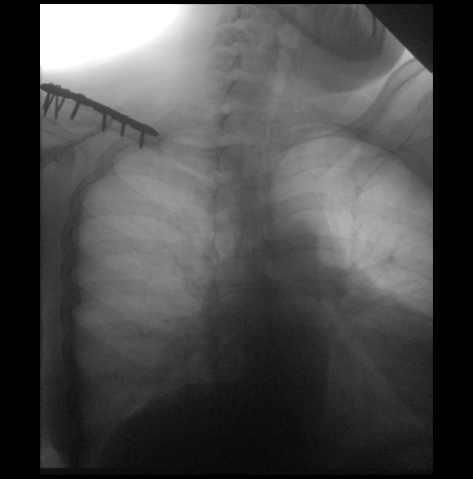
[frame 186/218]
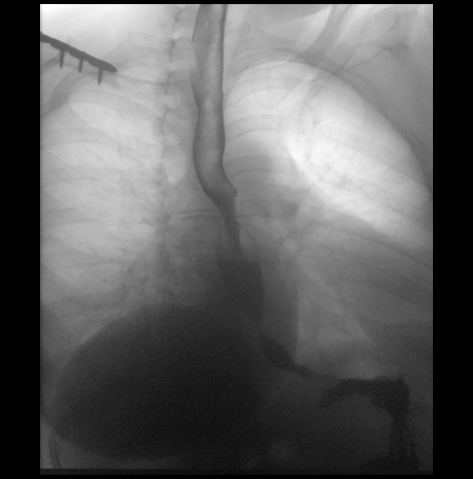

[Series 4: cp_standard · 0.36mm/px · 1 of 111 frames shown (2 of 10)]
[frame 95/111]
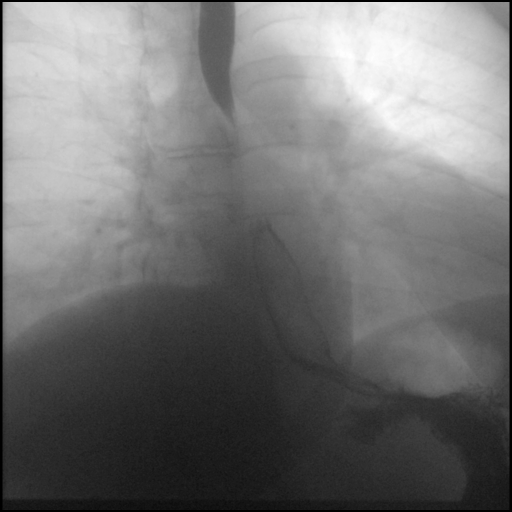

[Series 5: cp_standard · 0.36mm/px · 1 of 29 frames shown (3 of 10)]
[frame 15/29]
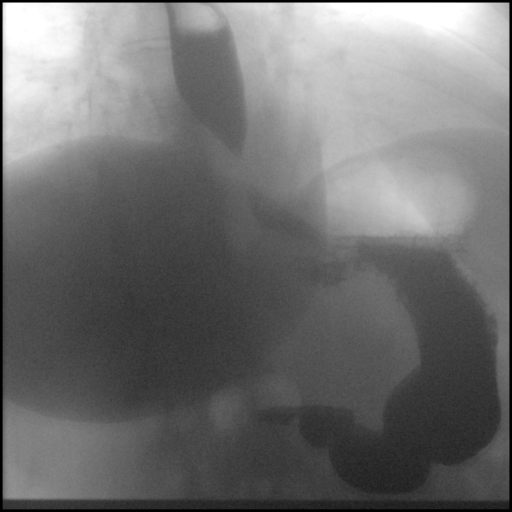

[Series 7: cp_standard · 0.36mm/px · 2 of 140 frames shown (4 of 10)]
[frame 22/140]
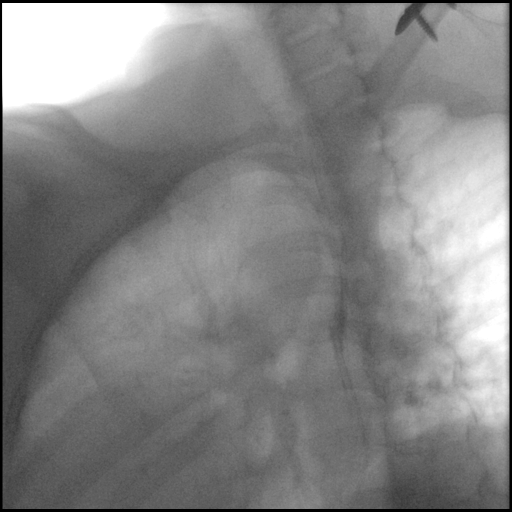
[frame 120/140]
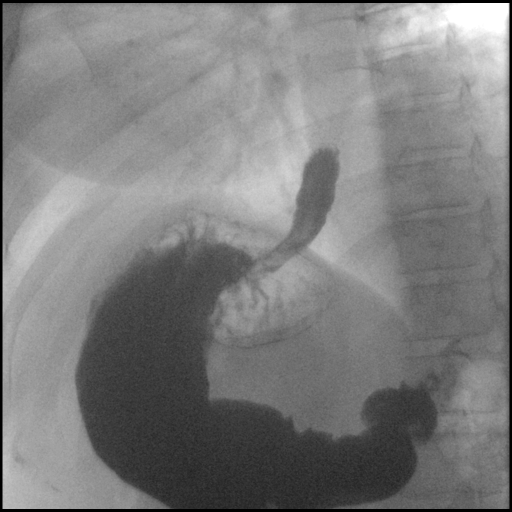

[Series 8: cp_standard · 0.36mm/px · 1 of 113 frames shown (5 of 10)]
[frame 97/113]
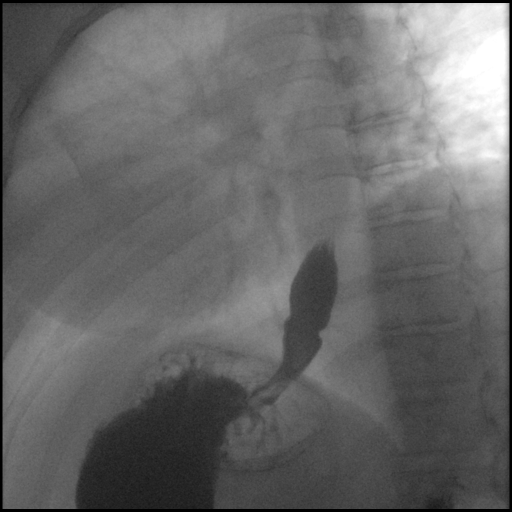

[Series 9: cp_standard · 0.36mm/px · 1 of 95 frames shown (6 of 10)]
[frame 81/95]
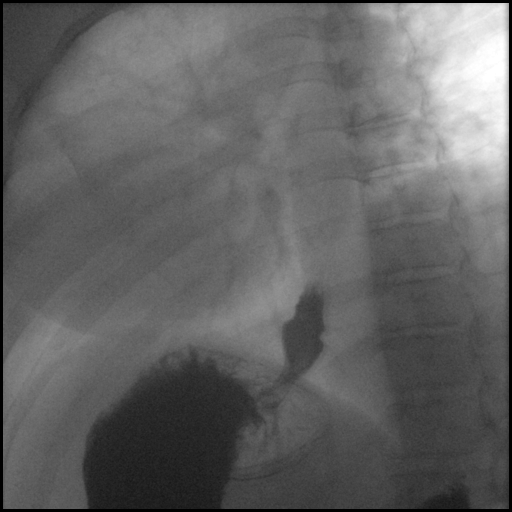

[Series 10: cp_standard · 0.36mm/px · 1 of 176 frames shown (7 of 10)]
[frame 89/176]
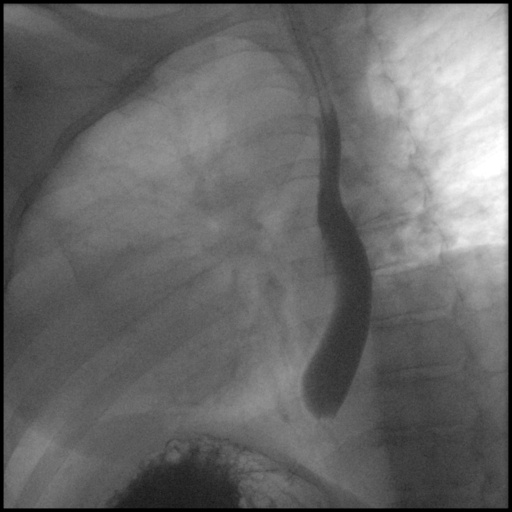

[Series 12: cp_standard · 0.18mm/px · 1 of 1 slices shown (8 of 10)]
[im 1/1]
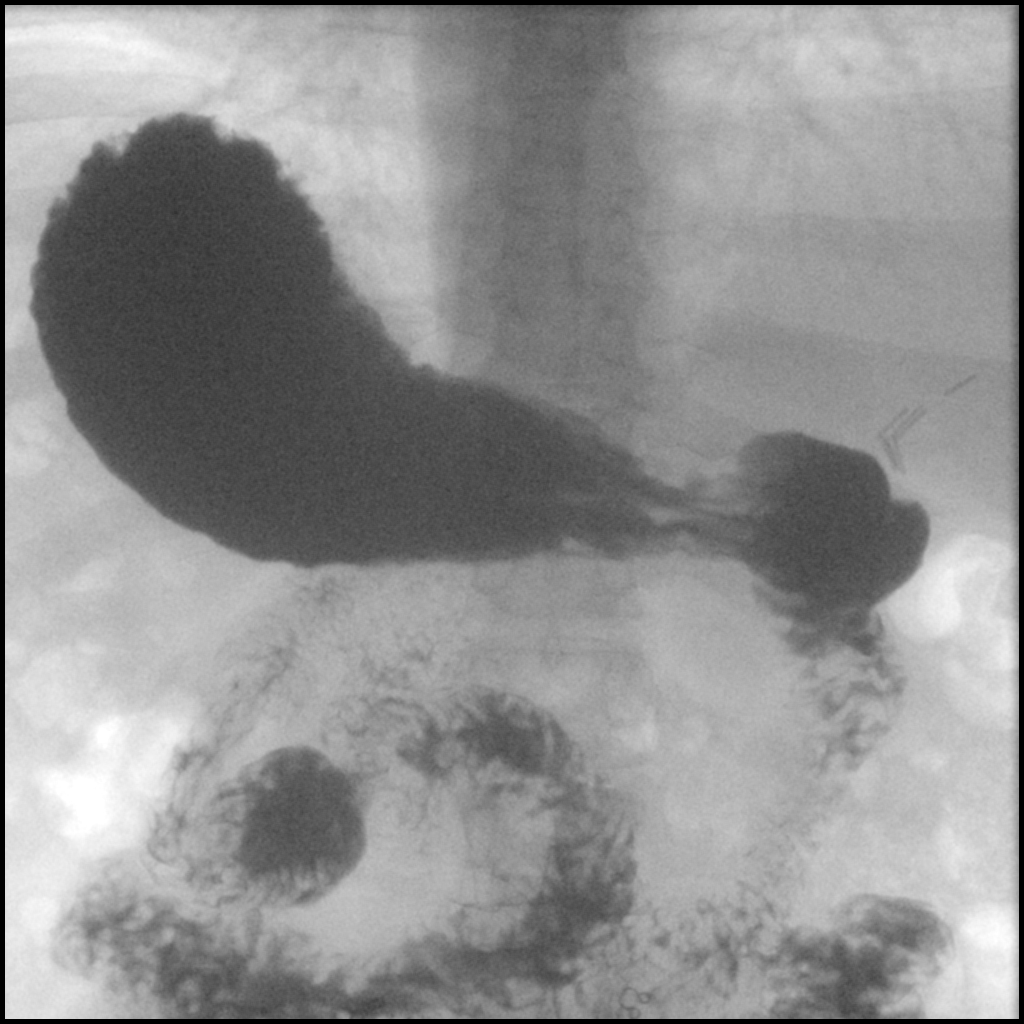

[Series 15: cp_standard · 0.35mm/px · 1 of 85 frames shown (9 of 10)]
[frame 43/85]
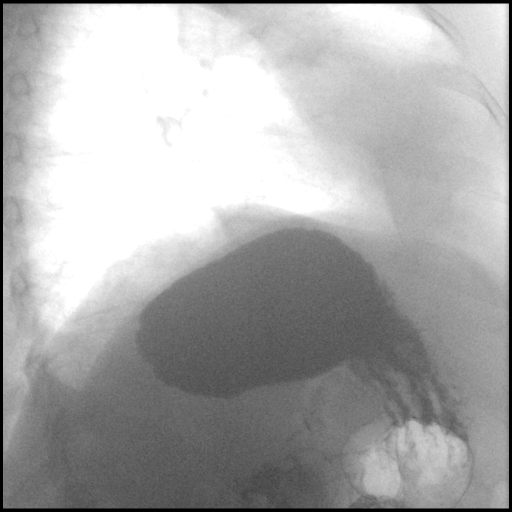

[Series 16: cp_standard · 0.18mm/px · 1 of 1 slices shown (10 of 10)]
[im 1/1]
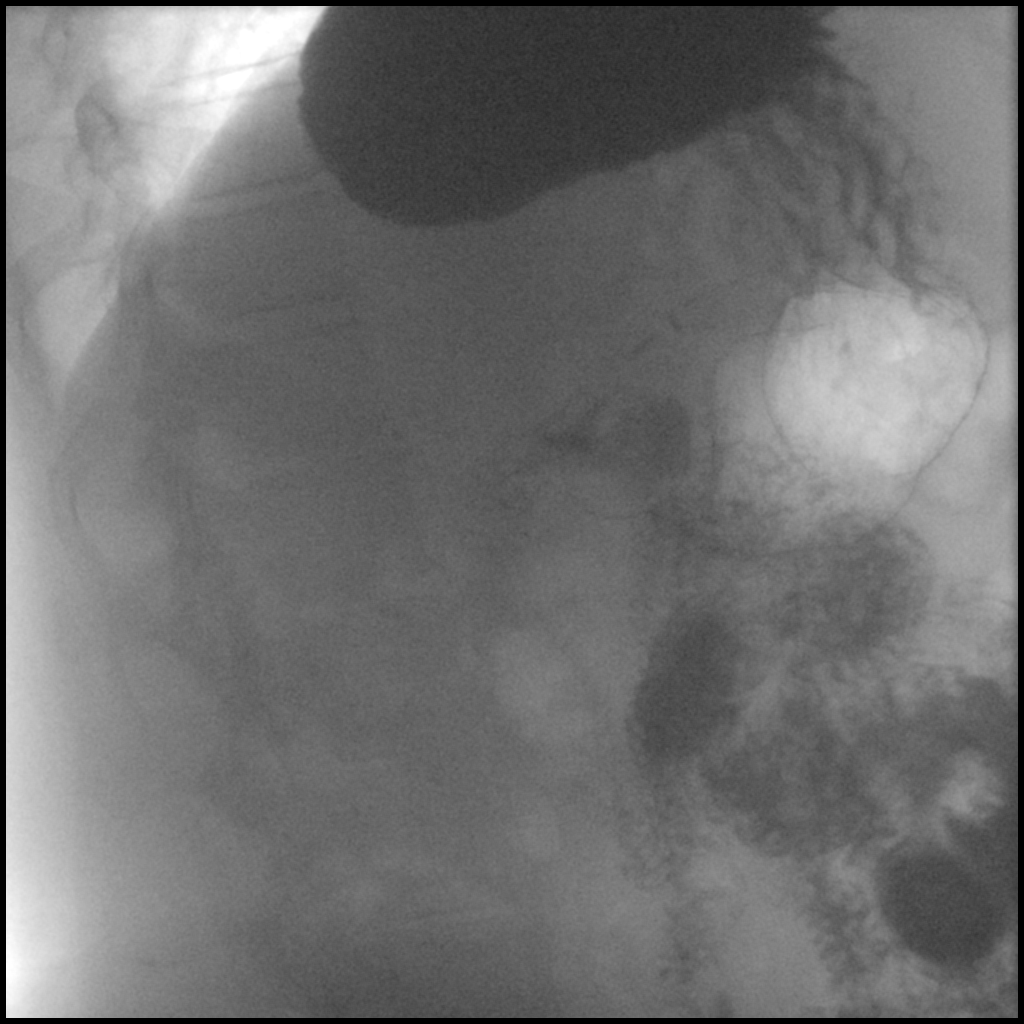

[12 of 24 positions shown; findings below may reference images not displayed]

FINDINGS: Scout image demonstrates moderate stool throughout the colon. Prior
cholecystectomy. No organomegaly or suspicious calcification.

Fluoroscopic evaluation of swallowing demonstrates normal esophageal
peristalsis. No esophageal stricture, mass or fold thickening.
Stomach, duodenal bulb and duodenal sweep unremarkable. No
ulceration, mass or fold thickening. No reflux with the water siphon
maneuver.
IMPRESSION: Unremarkable upper GI.

## 2020-06-07 ENCOUNTER — Other Ambulatory Visit (HOSPITAL_COMMUNITY): Payer: Self-pay

## 2020-06-19 ENCOUNTER — Other Ambulatory Visit (HOSPITAL_COMMUNITY): Payer: Self-pay

## 2020-06-21 ENCOUNTER — Other Ambulatory Visit (HOSPITAL_COMMUNITY): Payer: Self-pay

## 2020-06-27 DIAGNOSIS — M8589 Other specified disorders of bone density and structure, multiple sites: Secondary | ICD-10-CM | POA: Diagnosis not present

## 2020-07-04 ENCOUNTER — Other Ambulatory Visit (HOSPITAL_COMMUNITY): Payer: Self-pay

## 2020-07-04 MED FILL — Letrozole Tab 2.5 MG: ORAL | 90 days supply | Qty: 90 | Fill #0 | Status: CN

## 2020-07-13 DIAGNOSIS — Z79899 Other long term (current) drug therapy: Secondary | ICD-10-CM | POA: Diagnosis not present

## 2020-07-13 DIAGNOSIS — C50919 Malignant neoplasm of unspecified site of unspecified female breast: Secondary | ICD-10-CM | POA: Diagnosis not present

## 2020-07-13 DIAGNOSIS — Z853 Personal history of malignant neoplasm of breast: Secondary | ICD-10-CM | POA: Diagnosis not present

## 2020-08-14 ENCOUNTER — Other Ambulatory Visit (HOSPITAL_COMMUNITY): Payer: Self-pay

## 2020-08-14 MED FILL — Escitalopram Oxalate Tab 10 MG (Base Equiv): ORAL | 90 days supply | Qty: 90 | Fill #0 | Status: AC

## 2020-08-14 MED FILL — Atorvastatin Calcium Tab 20 MG (Base Equivalent): ORAL | 90 days supply | Qty: 90 | Fill #0 | Status: AC

## 2020-08-15 ENCOUNTER — Encounter (HOSPITAL_COMMUNITY): Payer: Self-pay | Admitting: *Deleted

## 2020-08-31 ENCOUNTER — Other Ambulatory Visit (HOSPITAL_COMMUNITY): Payer: Self-pay

## 2020-08-31 DIAGNOSIS — A09 Infectious gastroenteritis and colitis, unspecified: Secondary | ICD-10-CM | POA: Diagnosis not present

## 2020-08-31 DIAGNOSIS — Z1152 Encounter for screening for COVID-19: Secondary | ICD-10-CM | POA: Diagnosis not present

## 2020-08-31 DIAGNOSIS — R5383 Other fatigue: Secondary | ICD-10-CM | POA: Diagnosis not present

## 2020-08-31 DIAGNOSIS — R197 Diarrhea, unspecified: Secondary | ICD-10-CM | POA: Diagnosis not present

## 2020-08-31 MED ORDER — CIPROFLOXACIN HCL 750 MG PO TABS
ORAL_TABLET | ORAL | 0 refills | Status: DC
  Filled 2020-08-31: qty 3, 3d supply, fill #0

## 2020-08-31 MED ORDER — ONDANSETRON 8 MG PO TBDP
ORAL_TABLET | ORAL | 0 refills | Status: DC
  Filled 2020-08-31: qty 42, 14d supply, fill #0

## 2020-09-06 DIAGNOSIS — R5383 Other fatigue: Secondary | ICD-10-CM | POA: Diagnosis not present

## 2020-09-06 DIAGNOSIS — R935 Abnormal findings on diagnostic imaging of other abdominal regions, including retroperitoneum: Secondary | ICD-10-CM | POA: Diagnosis not present

## 2020-09-06 DIAGNOSIS — Z20828 Contact with and (suspected) exposure to other viral communicable diseases: Secondary | ICD-10-CM | POA: Diagnosis not present

## 2020-09-06 DIAGNOSIS — Z1152 Encounter for screening for COVID-19: Secondary | ICD-10-CM | POA: Diagnosis not present

## 2020-09-10 ENCOUNTER — Other Ambulatory Visit (HOSPITAL_COMMUNITY): Payer: Self-pay

## 2020-09-26 ENCOUNTER — Other Ambulatory Visit (HOSPITAL_COMMUNITY): Payer: Self-pay

## 2020-09-26 MED FILL — Dulaglutide Soln Auto-injector 3 MG/0.5ML: SUBCUTANEOUS | 28 days supply | Qty: 2 | Fill #0 | Status: AC

## 2020-09-26 MED FILL — Letrozole Tab 2.5 MG: ORAL | 90 days supply | Qty: 90 | Fill #0 | Status: AC

## 2020-09-27 DIAGNOSIS — H524 Presbyopia: Secondary | ICD-10-CM | POA: Diagnosis not present

## 2020-10-10 ENCOUNTER — Other Ambulatory Visit: Payer: Self-pay | Admitting: Internal Medicine

## 2020-10-10 DIAGNOSIS — R935 Abnormal findings on diagnostic imaging of other abdominal regions, including retroperitoneum: Secondary | ICD-10-CM

## 2020-10-30 ENCOUNTER — Other Ambulatory Visit (HOSPITAL_COMMUNITY): Payer: Self-pay

## 2020-11-02 DIAGNOSIS — E785 Hyperlipidemia, unspecified: Secondary | ICD-10-CM | POA: Diagnosis not present

## 2020-11-02 DIAGNOSIS — E559 Vitamin D deficiency, unspecified: Secondary | ICD-10-CM | POA: Diagnosis not present

## 2020-11-09 ENCOUNTER — Other Ambulatory Visit (HOSPITAL_COMMUNITY): Payer: Self-pay

## 2020-11-09 DIAGNOSIS — Z1331 Encounter for screening for depression: Secondary | ICD-10-CM | POA: Diagnosis not present

## 2020-11-09 DIAGNOSIS — Z79811 Long term (current) use of aromatase inhibitors: Secondary | ICD-10-CM | POA: Diagnosis not present

## 2020-11-09 DIAGNOSIS — Z23 Encounter for immunization: Secondary | ICD-10-CM | POA: Diagnosis not present

## 2020-11-09 DIAGNOSIS — E785 Hyperlipidemia, unspecified: Secondary | ICD-10-CM | POA: Diagnosis not present

## 2020-11-09 DIAGNOSIS — Z6834 Body mass index (BMI) 34.0-34.9, adult: Secondary | ICD-10-CM | POA: Diagnosis not present

## 2020-11-09 DIAGNOSIS — E8881 Metabolic syndrome: Secondary | ICD-10-CM | POA: Diagnosis not present

## 2020-11-09 DIAGNOSIS — E669 Obesity, unspecified: Secondary | ICD-10-CM | POA: Diagnosis not present

## 2020-11-09 DIAGNOSIS — Z853 Personal history of malignant neoplasm of breast: Secondary | ICD-10-CM | POA: Diagnosis not present

## 2020-11-09 DIAGNOSIS — Z0000 Encounter for general adult medical examination without abnormal findings: Secondary | ICD-10-CM | POA: Diagnosis not present

## 2020-11-09 DIAGNOSIS — Z1339 Encounter for screening examination for other mental health and behavioral disorders: Secondary | ICD-10-CM | POA: Diagnosis not present

## 2020-11-09 DIAGNOSIS — F418 Other specified anxiety disorders: Secondary | ICD-10-CM | POA: Diagnosis not present

## 2020-11-09 MED ORDER — ATORVASTATIN CALCIUM 20 MG PO TABS
20.0000 mg | ORAL_TABLET | Freq: Every day | ORAL | 3 refills | Status: DC
  Filled 2020-11-09: qty 90, 90d supply, fill #0
  Filled 2021-04-10: qty 90, 90d supply, fill #1
  Filled 2021-08-01: qty 90, 90d supply, fill #2

## 2020-11-09 MED ORDER — ESCITALOPRAM OXALATE 10 MG PO TABS
10.0000 mg | ORAL_TABLET | Freq: Every day | ORAL | 3 refills | Status: DC
  Filled 2020-11-09: qty 90, 90d supply, fill #0
  Filled 2021-04-10: qty 90, 90d supply, fill #1
  Filled 2021-08-01: qty 90, 90d supply, fill #2

## 2020-12-03 ENCOUNTER — Other Ambulatory Visit (HOSPITAL_COMMUNITY): Payer: Self-pay

## 2020-12-03 MED FILL — Dulaglutide Soln Auto-injector 3 MG/0.5ML: SUBCUTANEOUS | 84 days supply | Qty: 6 | Fill #1 | Status: AC

## 2020-12-05 ENCOUNTER — Ambulatory Visit (INDEPENDENT_AMBULATORY_CARE_PROVIDER_SITE_OTHER): Payer: 59 | Admitting: Cardiology

## 2020-12-05 ENCOUNTER — Other Ambulatory Visit: Payer: Self-pay

## 2020-12-05 ENCOUNTER — Other Ambulatory Visit (HOSPITAL_COMMUNITY): Payer: Self-pay

## 2020-12-05 ENCOUNTER — Encounter (HOSPITAL_BASED_OUTPATIENT_CLINIC_OR_DEPARTMENT_OTHER): Payer: Self-pay | Admitting: Cardiology

## 2020-12-05 VITALS — BP 114/80 | HR 70 | Ht 67.0 in | Wt 210.0 lb

## 2020-12-05 DIAGNOSIS — I10 Essential (primary) hypertension: Secondary | ICD-10-CM

## 2020-12-05 DIAGNOSIS — Z853 Personal history of malignant neoplasm of breast: Secondary | ICD-10-CM | POA: Diagnosis not present

## 2020-12-05 DIAGNOSIS — E7800 Pure hypercholesterolemia, unspecified: Secondary | ICD-10-CM

## 2020-12-05 DIAGNOSIS — E669 Obesity, unspecified: Secondary | ICD-10-CM | POA: Diagnosis not present

## 2020-12-05 DIAGNOSIS — Z7189 Other specified counseling: Secondary | ICD-10-CM | POA: Diagnosis not present

## 2020-12-05 NOTE — Patient Instructions (Signed)

## 2020-12-05 NOTE — Progress Notes (Signed)
Cardiology Office Note:    Date:  12/05/2020   ID:  Alexis Chase, DOB 1971/02/18, MRN [id number]  PCP:  Michael Boston, MD  Cardiologist:  Buford Dresser, MD PhD  Referring MD: Michael Boston, MD   CC: Follow up  History of Present Illness:    Alexis Chase is a 50 y.o. female with a hx of breast cancer in remission, obesity, hypertension who is seen for follow up today. She has a past medical history of chemotherapy with adriamycin, HTN, and HLD, as well as a remote history of tobacco use, as risk factors. She recently underwent bariatric surgery in 01/2018.  Breast cancer history: in 2015, L breast cancer treated with neoadjuvant chemotherapy followed by lumpectomy and radiation, then chemotherapy. Follows up in [place], [place] with oncology.   Today: Overall she is feeling good. We reviewed in detail her current medication regimen and potential risk for side effects.  Since her last visit she has successfully lost weight.  Currently she is taking aspirin every other day.  She denies any palpitations, chest pain, or shortness of breath. No lightheadedness, headaches, syncope, orthopnea, or PND. Also has no lower extremity edema or exertional symptoms.   Past Medical History:  Diagnosis Date   Anemia    Cancer ([place]) 2015   breast cancer    Closed displaced fracture of acromial end of right clavicle 03/09/2015   Decreased bone mass 08/20/2018   Dysthymia, Rx Lexapro 02/07/2017   Essential hypertension, on Norvasc 02/07/2017   History of breast cancer, Dx 2015, ER +, HER2 -, grade 3, s/p adriamycin and radiation 02/07/2017   Hyperlipidemia, on statin 45/36/4680   Metabolic syndrome 32/02/2246   PCOS (polycystic ovarian syndrome)    Personal history of chemo and radiation therapy for breast cancer    Pure hypercholesterolemia 02/07/2017   S/P laparoscopic sleeve gastrectomy, 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 Redmond Pulling 03/29/2018   SUI (stress urinary incontinence, female)  01/25/2018   Use of letrozole (Femara) 08/20/2018   Vitamin D deficiency 02/08/2017    Past Surgical History:  Procedure Laterality Date   CESAREAN SECTION     CHOLECYSTECTOMY     HERNIA REPAIR     LAPAROSCOPIC GASTRIC SLEEVE RESECTION WITH HIATAL HERNIA REPAIR N/A 01/25/2018   Procedure: LAPAROSCOPIC GASTRIC SLEEVE RESECTION WITH HIATAL HERNIA REPAIR, UPPER ENDO AND ERAS PATHWAY;  Surgeon: [name], MD;  Location: [facility];  Service: General;  Laterality: N/A;   PORTACATH PLACEMENT     TONSILLECTOMY     TOTAL ABDOMINAL HYSTERECTOMY W/ BILATERAL SALPINGOOPHORECTOMY      Current Medications: Current Outpatient Medications on File Prior to Visit  Medication Sig   aspirin EC 81 MG tablet Take 81 mg by mouth daily.   atorvastatin (LIPITOR) 20 MG tablet Take 1 tablet (20 mg total) by mouth daily.   Calcium 500-100 MG-UNIT CHEW Chew 3 each by mouth daily.   Cholecalciferol (VITAMIN D) 125 MCG (5000 UT) CAPS Take 5,000 Units by mouth daily.   denosumab (PROLIA) 60 MG/ML SOSY injection Inject 60 mg into the skin every 6 (six) months.   Dulaglutide 3 MG/0.5ML SOPN INJECT 3 MG INTO THE SKIN ONCE A WEEK.   escitalopram (LEXAPRO) 10 MG tablet Take 1 tablet (10 mg total) by mouth daily.   letrozole (FEMARA) 2.5 MG tablet TAKE 1 TABLET BY MOUTH DAILY   Multiple Vitamin (MULTIVITAMIN) tablet Take 1 tablet by mouth 2 (two) times daily. [employer]   No current facility-administered medications on  file prior to visit.     Allergies:   Patient has no known allergies.   Social History   Tobacco Use   Smoking status: Former    Years: 5.00    Types: Cigarettes   Smokeless tobacco: Never   Tobacco comments:    intermittently; socially   Vaping Use   Vaping Use: Never used  Substance Use Topics   Alcohol use: Yes    Alcohol/week: 2.0 standard drinks    Types: 2 Glasses of wine per week    Comment: once a week    Drug use: Never  husband is OB/Gyn  Family History: The patient's  family history includes Alcohol abuse in her maternal grandmother; Arthritis in her maternal grandmother; COPD in her father and maternal grandmother; Colon polyps in her father and mother; Diabetes in her father and paternal grandmother; Heart disease in her father and paternal grandmother; High Cholesterol in her mother; High blood pressure in her father; Hypertension in her paternal grandmother. There is no history of Colon cancer, Esophageal cancer, Rectal cancer, or Stomach cancer.  ROS:   Please see the history of present illness.   Additional pertinent ROS otherwise unremarkable  EKGs/Labs/Other Studies Reviewed:    The following studies were reviewed today:  Echo 10/09/17 Left ventricle: The cavity size was normal. Wall thickness was   increased in a pattern of mild LVH. Systolic function was normal.   The estimated ejection fraction was in the range of 60% to 65%.   Wall motion was normal; there were no regional wall motion   abnormalities. Left ventricular diastolic function parameters   were normal. GLS: -20.1%   Care Everywhere echo from 08/03/2014 Final Impressions: 1. There are no significant chamber enlargements noted. 2. Global left ventricular wall motion and contractility are within normal limits. 3. The estimated ejection fraction is (+/-) 65.74% by 2D.  4. Normal left ventricular diastolic filling is observed. 5. The right ventricular systolic function is within normal limits. 6. All four cardiac valves appear normal in structure and function. 7. The inferior vena cava appearance suggests normal right ventricle filling pressures. 8. There is no pericardial effusion.  Echo limited 08/03/2015 Final Impressions: 1. This is a limited echo to reassess EF; patient has history of chemotherapy. 2. The left ventricular chamber size is normal. 3. Global left ventricular wall motion and contractility are within normal limits. The estimated ejection fraction is 60-65%. 4.  There is no pericardial effusion. 5. The inferior vena cava appears normal in size; and there is a greater than 50% respiratory change in the inferior vena cava dimension. 6. There is a greater than 50% respiratory change in the inferior vena cava dimension.  EKG:  EKG is ordered today.   12/05/2020: NSR at 70 bpm 11/17/2019: normal sinus rhythm at 78 bpm  Recent Labs: No results found for requested labs within last 8760 hours.  Recent Lipid Panel    Component Value Date/Time   CHOL 156 08/24/2018 0903   TRIG 171.0 (H) 08/24/2018 0903   HDL 49.90 08/24/2018 0903   CHOLHDL 3 08/24/2018 0903   VLDL 34.2 08/24/2018 0903   LDLCALC 72 08/24/2018 0903   LDLCALC 81 02/06/2017 1522    Physical Exam:    VS:  BP 114/80   Pulse 70   Ht _0  (1.702 m)   Wt 210 lb (95.3 kg)   SpO2 99%   BMI 32.89 kg/m     Wt Readings from Last 3 Encounters:  12/05/20 210  lb (95.3 kg)  11/17/19 215 lb 12.8 oz (97.9 kg)  11/10/19 218 lb (98.9 kg)    GEN: Well nourished, well developed in no acute distress HEENT: Normal, moist mucous membranes NECK: No JVD CARDIAC: regular rhythm, normal S1 and S2, no rubs or gallops. No murmur. VASCULAR: Radial and DP pulses 2+ bilaterally. No carotid bruits RESPIRATORY:  Clear to auscultation without rales, wheezing or rhonchi  ABDOMEN: Soft, non-tender, non-distended MUSCULOSKELETAL:  Ambulates independently SKIN: Warm and dry, no edema NEUROLOGIC:  Alert and oriented x 3. No focal neuro deficits noted. PSYCHIATRIC:  Normal affect   ASSESSMENT:    1. Essential hypertension   2. Obesity (BMI 30-39.9)   3. History of breast cancer, ER +, HER2 -, grade 3, s/p chemo and radiation. 3 year anniversery recently   4. Pure hypercholesterolemia   5. Cardiac risk counseling   6. Counseling on health promotion and disease prevention     PLAN:    Obesity and metabolic syndrome, now s/p sleeve gastrectomy 01/2018:  -she is very on top of the current literature.  We discussed some of the newer data and recommendations today.  -BMI 32, wt 210, congratulated on weight loss -on dulaglutide  Hypertension: well controlled today.  -off all antihypertensives  Hyperlipidemia, history of type II diabetes:  -she is well educated on the literature. Discussed the available data on statins and dementia, breast cancer -continue atorvastatin, aspirin given diabetes. On GLP1RA as well  History of breast cancer, with radiation and adriamycin:  -doing well, echo with normal strain measurements 09/2017  CV prevention recommendations -recommend heart healthy/Mediterranean diet, with whole grains, fruits, vegetable, fish, lean meats, nuts, and olive oil. Limit salt. -recommend moderate walking, 3-5 times/week for 30-50 minutes each session. Aim for at least 150 minutes.week. Goal should be pace of 3 miles/hours, or walking 1.5 miles in 30 minutes -recommend avoidance of tobacco products. Avoid excess alcohol. -ASCVD risk score: The 10-year ASCVD risk score (Arnett DK, et al., 2019) is: 1.7%   Values used to calculate the score:     Age: 27 years     Sex: Female     Is Non-Hispanic African American: No     Diabetic: Yes     Tobacco smoker: No     Systolic Blood Pressure: 093 mmHg     Is BP treated: No     HDL Cholesterol: 49.9 mg/dL     Total Cholesterol: 156 mg/dL   Plan for follow up: 1 year or sooner as needed  Buford Dresser, MD, PhD [place]  [facility]   Medication Adjustments/Labs and Tests Ordered: Current medicines are reviewed at length with the patient today.  Concerns regarding medicines are outlined above.   Orders Placed This Encounter  Procedures   EKG 12-Lead    No orders of the defined types were placed in this encounter.   Patient Instructions  Medication Instructions:  Your Physician recommend you continue on your current medication as directed.    *If you need a refill on your cardiac medications before your next  appointment, please call your pharmacy*   Lab Work: None ordered today   Testing/Procedures: None ordered today   Follow-Up: At [facility], you and your health needs are our priority.  As part of our continuing mission to provide you with exceptional heart care, we have created designated Provider Care Teams.  These Care Teams include your primary Cardiologist (physician) and Advanced Practice Providers (APPs -  Physician Assistants and Nurse Practitioners)  who all work together to provide you with the care you need, when you need it.  We recommend signing up for the patient portal called "MyChart".  Sign up information is provided on this After Visit Summary.  MyChart is used to connect with patients for Virtual Visits (Telemedicine).  Patients are able to view lab/test results, encounter notes, upcoming appointments, etc.  Non-urgent messages can be sent to your provider as well.   To learn more about what you can do with MyChart, go to [email or web address].    Your next appointment:   1 year(s)  The format for your next appointment:   In Person  Provider:   Buford Dresser, MD    [facility],acting as a scribe for Buford Dresser, MD.,have documented all relevant documentation on the behalf of Buford Dresser, MD,as directed by  Buford Dresser, MD while in the presence of Buford Dresser, MD.  I, Buford Dresser, MD, have reviewed all documentation for this visit. The documentation on 12/24/20 for the exam, diagnosis, procedures, and orders are all accurate and complete.   Signed, Buford Dresser, MD PhD 12/05/2020  [place]

## 2020-12-24 DIAGNOSIS — I10 Essential (primary) hypertension: Secondary | ICD-10-CM | POA: Diagnosis not present

## 2020-12-24 DIAGNOSIS — F419 Anxiety disorder, unspecified: Secondary | ICD-10-CM | POA: Diagnosis not present

## 2020-12-24 DIAGNOSIS — E663 Overweight: Secondary | ICD-10-CM | POA: Diagnosis not present

## 2020-12-24 DIAGNOSIS — E7800 Pure hypercholesterolemia, unspecified: Secondary | ICD-10-CM | POA: Diagnosis not present

## 2020-12-24 DIAGNOSIS — G43909 Migraine, unspecified, not intractable, without status migrainosus: Secondary | ICD-10-CM | POA: Diagnosis not present

## 2020-12-24 DIAGNOSIS — K449 Diaphragmatic hernia without obstruction or gangrene: Secondary | ICD-10-CM | POA: Diagnosis not present

## 2020-12-24 DIAGNOSIS — C50912 Malignant neoplasm of unspecified site of left female breast: Secondary | ICD-10-CM | POA: Diagnosis not present

## 2020-12-24 DIAGNOSIS — E46 Unspecified protein-calorie malnutrition: Secondary | ICD-10-CM | POA: Diagnosis not present

## 2020-12-24 DIAGNOSIS — F329 Major depressive disorder, single episode, unspecified: Secondary | ICD-10-CM | POA: Diagnosis not present

## 2020-12-27 ENCOUNTER — Other Ambulatory Visit (HOSPITAL_COMMUNITY): Payer: Self-pay

## 2021-01-01 ENCOUNTER — Other Ambulatory Visit (HOSPITAL_COMMUNITY): Payer: Self-pay

## 2021-01-02 ENCOUNTER — Other Ambulatory Visit (HOSPITAL_COMMUNITY): Payer: Self-pay

## 2021-01-09 ENCOUNTER — Other Ambulatory Visit (HOSPITAL_COMMUNITY): Payer: Self-pay

## 2021-01-10 ENCOUNTER — Other Ambulatory Visit (HOSPITAL_COMMUNITY): Payer: Self-pay

## 2021-01-10 MED FILL — Letrozole Tab 2.5 MG: ORAL | 90 days supply | Qty: 90 | Fill #1 | Status: AC

## 2021-01-11 DIAGNOSIS — Z1231 Encounter for screening mammogram for malignant neoplasm of breast: Secondary | ICD-10-CM | POA: Diagnosis not present

## 2021-01-14 DIAGNOSIS — C50912 Malignant neoplasm of unspecified site of left female breast: Secondary | ICD-10-CM | POA: Diagnosis not present

## 2021-01-14 DIAGNOSIS — Z713 Dietary counseling and surveillance: Secondary | ICD-10-CM | POA: Diagnosis not present

## 2021-01-15 DIAGNOSIS — E8881 Metabolic syndrome: Secondary | ICD-10-CM | POA: Diagnosis not present

## 2021-01-15 DIAGNOSIS — M8589 Other specified disorders of bone density and structure, multiple sites: Secondary | ICD-10-CM | POA: Diagnosis not present

## 2021-01-15 DIAGNOSIS — E559 Vitamin D deficiency, unspecified: Secondary | ICD-10-CM | POA: Diagnosis not present

## 2021-01-18 ENCOUNTER — Other Ambulatory Visit (HOSPITAL_COMMUNITY): Payer: Self-pay

## 2021-01-18 MED ORDER — LETROZOLE 2.5 MG PO TABS
2.5000 mg | ORAL_TABLET | Freq: Every day | ORAL | 3 refills | Status: DC
  Filled 2021-01-18 – 2021-04-10 (×2): qty 90, 90d supply, fill #0
  Filled 2021-08-01: qty 90, 90d supply, fill #1

## 2021-01-22 ENCOUNTER — Other Ambulatory Visit (HOSPITAL_COMMUNITY): Payer: Self-pay

## 2021-01-22 MED ORDER — MOUNJARO 10 MG/0.5ML ~~LOC~~ SOAJ
SUBCUTANEOUS | 2 refills | Status: DC
  Filled 2021-01-22: qty 2, 28d supply, fill #0
  Filled 2021-04-10: qty 2, 28d supply, fill #1
  Filled 2021-06-03: qty 2, 28d supply, fill #2

## 2021-02-05 DIAGNOSIS — E7800 Pure hypercholesterolemia, unspecified: Secondary | ICD-10-CM | POA: Diagnosis not present

## 2021-02-05 DIAGNOSIS — F419 Anxiety disorder, unspecified: Secondary | ICD-10-CM | POA: Diagnosis not present

## 2021-02-05 DIAGNOSIS — K449 Diaphragmatic hernia without obstruction or gangrene: Secondary | ICD-10-CM | POA: Diagnosis not present

## 2021-02-05 DIAGNOSIS — G43909 Migraine, unspecified, not intractable, without status migrainosus: Secondary | ICD-10-CM | POA: Diagnosis not present

## 2021-02-05 DIAGNOSIS — E669 Obesity, unspecified: Secondary | ICD-10-CM | POA: Diagnosis not present

## 2021-02-05 DIAGNOSIS — F329 Major depressive disorder, single episode, unspecified: Secondary | ICD-10-CM | POA: Diagnosis not present

## 2021-02-05 DIAGNOSIS — I10 Essential (primary) hypertension: Secondary | ICD-10-CM | POA: Diagnosis not present

## 2021-02-05 DIAGNOSIS — Z713 Dietary counseling and surveillance: Secondary | ICD-10-CM | POA: Diagnosis not present

## 2021-02-05 DIAGNOSIS — C50912 Malignant neoplasm of unspecified site of left female breast: Secondary | ICD-10-CM | POA: Diagnosis not present

## 2021-02-08 ENCOUNTER — Telehealth: Payer: Self-pay | Admitting: Obstetrics & Gynecology

## 2021-02-08 ENCOUNTER — Other Ambulatory Visit (HOSPITAL_COMMUNITY): Payer: Self-pay

## 2021-02-08 MED ORDER — AZITHROMYCIN 250 MG PO TABS: ORAL_TABLET | ORAL | 0 refills | Status: DC

## 2021-02-08 MED ORDER — AZITHROMYCIN 250 MG PO TABS
ORAL_TABLET | ORAL | 0 refills | Status: DC
  Filled 2021-02-08: qty 6, fill #0

## 2021-02-08 NOTE — Telephone Encounter (Signed)
Pt report congestion and upper respiratory symptoms.  Tried OTC regimen with minimal improvement.  Encouraged hydration.  ZPak sent in to take if no improvement of symptoms.

## 2021-03-06 ENCOUNTER — Other Ambulatory Visit (HOSPITAL_COMMUNITY): Payer: Self-pay

## 2021-03-20 ENCOUNTER — Other Ambulatory Visit (HOSPITAL_COMMUNITY): Payer: Self-pay

## 2021-04-10 ENCOUNTER — Other Ambulatory Visit (HOSPITAL_COMMUNITY): Payer: Self-pay

## 2021-04-11 ENCOUNTER — Other Ambulatory Visit (HOSPITAL_COMMUNITY): Payer: Self-pay

## 2021-04-15 ENCOUNTER — Other Ambulatory Visit (HOSPITAL_COMMUNITY): Payer: Self-pay

## 2021-04-22 ENCOUNTER — Other Ambulatory Visit (HOSPITAL_COMMUNITY): Payer: Self-pay

## 2021-05-16 DIAGNOSIS — R935 Abnormal findings on diagnostic imaging of other abdominal regions, including retroperitoneum: Secondary | ICD-10-CM | POA: Diagnosis not present

## 2021-05-16 DIAGNOSIS — E8881 Metabolic syndrome: Secondary | ICD-10-CM | POA: Diagnosis not present

## 2021-05-16 DIAGNOSIS — Z6831 Body mass index (BMI) 31.0-31.9, adult: Secondary | ICD-10-CM | POA: Diagnosis not present

## 2021-05-16 DIAGNOSIS — Z79811 Long term (current) use of aromatase inhibitors: Secondary | ICD-10-CM | POA: Diagnosis not present

## 2021-05-16 DIAGNOSIS — E669 Obesity, unspecified: Secondary | ICD-10-CM | POA: Diagnosis not present

## 2021-05-16 DIAGNOSIS — Z853 Personal history of malignant neoplasm of breast: Secondary | ICD-10-CM | POA: Diagnosis not present

## 2021-05-21 ENCOUNTER — Other Ambulatory Visit (HOSPITAL_COMMUNITY): Payer: Self-pay

## 2021-05-21 MED ORDER — ATORVASTATIN CALCIUM 20 MG PO TABS
20.0000 mg | ORAL_TABLET | Freq: Every day | ORAL | 3 refills | Status: AC
  Filled 2021-05-21 – 2022-02-20 (×3): qty 90, 90d supply, fill #0

## 2021-05-23 ENCOUNTER — Other Ambulatory Visit (HOSPITAL_COMMUNITY): Payer: Self-pay

## 2021-06-03 ENCOUNTER — Other Ambulatory Visit (HOSPITAL_COMMUNITY): Payer: Self-pay

## 2021-06-06 ENCOUNTER — Other Ambulatory Visit (HOSPITAL_COMMUNITY): Payer: Self-pay

## 2021-06-06 ENCOUNTER — Other Ambulatory Visit: Payer: Self-pay

## 2021-06-07 ENCOUNTER — Other Ambulatory Visit (HOSPITAL_COMMUNITY): Payer: Self-pay

## 2021-06-10 ENCOUNTER — Other Ambulatory Visit (HOSPITAL_COMMUNITY): Payer: Self-pay

## 2021-06-11 ENCOUNTER — Other Ambulatory Visit (HOSPITAL_COMMUNITY): Payer: Self-pay

## 2021-06-12 ENCOUNTER — Other Ambulatory Visit (HOSPITAL_COMMUNITY): Payer: Self-pay

## 2021-06-12 MED ORDER — PROLIA 60 MG/ML ~~LOC~~ SOSY: PREFILLED_SYRINGE | SUBCUTANEOUS | 1 refills | Status: DC

## 2021-07-05 DIAGNOSIS — R928 Other abnormal and inconclusive findings on diagnostic imaging of breast: Secondary | ICD-10-CM | POA: Diagnosis not present

## 2021-07-05 DIAGNOSIS — K6389 Other specified diseases of intestine: Secondary | ICD-10-CM | POA: Diagnosis not present

## 2021-07-05 DIAGNOSIS — C50919 Malignant neoplasm of unspecified site of unspecified female breast: Secondary | ICD-10-CM | POA: Diagnosis not present

## 2021-07-05 DIAGNOSIS — R918 Other nonspecific abnormal finding of lung field: Secondary | ICD-10-CM | POA: Diagnosis not present

## 2021-07-08 ENCOUNTER — Other Ambulatory Visit: Payer: Self-pay | Admitting: Internal Medicine

## 2021-07-08 ENCOUNTER — Other Ambulatory Visit (HOSPITAL_COMMUNITY): Payer: Self-pay

## 2021-07-08 ENCOUNTER — Ambulatory Visit: Payer: 59 | Attending: Internal Medicine | Admitting: Pharmacist

## 2021-07-08 DIAGNOSIS — R5381 Other malaise: Secondary | ICD-10-CM

## 2021-07-08 DIAGNOSIS — Z79899 Other long term (current) drug therapy: Secondary | ICD-10-CM

## 2021-07-08 MED ORDER — PROLIA 60 MG/ML ~~LOC~~ SOSY
PREFILLED_SYRINGE | SUBCUTANEOUS | 1 refills | Status: AC
  Filled 2021-07-08 (×2): qty 1, 180d supply, fill #0
  Filled 2021-12-24: qty 1, 180d supply, fill #1

## 2021-07-08 NOTE — Progress Notes (Signed)
S:  ?Patient presents for review of their specialty medication therapy. ? ?Patient is currently taking Prolia for osteoporosis. Patient is managed by Dr. Jacalyn Lefevre for this. Last saw her 01/15/2021. ? ?Adherence: confirmed  ? ?Efficacy: reports that it works well ? ?Dosing: '60mg'$  subq every 6 months ? ?Dose adjustments: ?Renal: Monitor patients with severe impairment (CrCl <30 mL/minute or on dialysis) closely, as significant and prolonged hypocalcemia (incidence of 29% and potentially lasting weeks to months) and marked elevations of serum parathyroid hormone are serious risks in this population. Ensure adequate calcium and vitamin D intake/supplementation. ?CrCl ?30 mL/minute: No dosage adjustment necessary. ?CrCl <30 mL/minute: No dosage adjustment necessary; use in conjunction with guidance from patient's nephrology team. ?Hepatic: no dose adjustments (has not been studied) ? ?Drug-drug interactions: none identified  ? ?Screening: ?TB test: completed  ?Hepatitis: completed  ? ?Monitoring: ?S/sx of infection: none  ?S/sx of hypersensitivity: none  ?S/sx of hypocalcemia/hypercalcemia: none  ?Dermatitis/skin rash: none  ?Peripheral edema: none  ?HA: none  ?GI upset:  ? ?Other side effects: none  ? ?Last bone density study: No results in Epic.  ? ? ?O:  ?   ? ?Lab Results  ?Component Value Date  ? WBC 4.4 08/24/2018  ? HGB 13.4 08/24/2018  ? HCT 39.1 08/24/2018  ? MCV 95.0 08/24/2018  ? PLT 209.0 08/24/2018  ? ? ?  Chemistry   ?   ?Component Value Date/Time  ? NA 142 08/24/2018 0903  ? K 4.3 08/24/2018 0903  ? CL 105 08/24/2018 0903  ? CO2 28 08/24/2018 0903  ? BUN 14 08/24/2018 0903  ? CREATININE 0.83 08/24/2018 0903  ? CREATININE 0.99 02/06/2017 1522  ?    ?Component Value Date/Time  ? CALCIUM 9.3 08/24/2018 0903  ? ALKPHOS 106 08/24/2018 0903  ? AST 18 08/24/2018 0903  ? ALT 17 08/24/2018 0903  ? BILITOT 0.5 08/24/2018 4196  ?  ? ? ? ?A/P: ?1. Medication review: Patient currently on Prolia for osteoporosis. Reviewed  the medication with the patient, including the following: Prolia (denosumab) is a monoclonal antibody with affinity for nuclear factor-kappa ligand (RANKL). Prolia binds to RANKL and prevents osteoclast formation, leading to decreased bone resorption and increased bone mass in osteoporosis. Patient educated on purpose, proper use, and potential adverse effects of Prolia. The most common adverse effects are hypersensitivities, peripheral edema, dermatitis/skin rash, GI upset, HA, joint pain, and infection. There is the possibility of atypical femur fracture, serum calcium disturbances, and osteonecrosis of the jaw. Patients should monitor for and report hip, thigh, or groin pain. Additionally, patients should monitor for and report jaw pain, tooth/periodontal infection, toothache, and/or gingival ulceration/erosion. Prolia exists as a solution prefilled syringe for SQ administration. Administration: Denosumab is intended for SubQ route only and should not be administered IV, IM, or intradermally. Prior to administration, bring to room temperature in original container (allow to stand ~15 to 30 minutes); do not warm by any other method. Solution may contain trace amounts of translucent to white protein particles; do not use if cloudy, discolored (normal solution should be clear and colorless to pale yellow), or contains excessive particles or foreign matter. Avoid vigorous shaking. Administer via SubQ injection in the upper arm, upper thigh, or abdomen; should only be administered by a health care professional. No recommendations for any changes at this time.  ? ?Benard Halsted, PharmD, BCACP, CPP ?Clinical Pharmacist ?[place] ?[phone] ? ? ? ?

## 2021-07-09 ENCOUNTER — Other Ambulatory Visit: Payer: Self-pay | Admitting: Internal Medicine

## 2021-07-09 ENCOUNTER — Other Ambulatory Visit (HOSPITAL_COMMUNITY): Payer: Self-pay

## 2021-07-09 DIAGNOSIS — M8580 Other specified disorders of bone density and structure, unspecified site: Secondary | ICD-10-CM

## 2021-07-10 ENCOUNTER — Other Ambulatory Visit (HOSPITAL_COMMUNITY): Payer: Self-pay

## 2021-07-10 DIAGNOSIS — E559 Vitamin D deficiency, unspecified: Secondary | ICD-10-CM | POA: Diagnosis not present

## 2021-07-10 DIAGNOSIS — M8589 Other specified disorders of bone density and structure, multiple sites: Secondary | ICD-10-CM | POA: Diagnosis not present

## 2021-08-01 ENCOUNTER — Other Ambulatory Visit (HOSPITAL_COMMUNITY): Payer: Self-pay

## 2021-08-01 MED ORDER — MOUNJARO 10 MG/0.5ML ~~LOC~~ SOAJ
SUBCUTANEOUS | 3 refills | Status: DC
  Filled 2021-08-01: qty 6, 84d supply, fill #0
  Filled 2022-01-03: qty 2, 28d supply, fill #1
  Filled 2022-02-20: qty 2, 28d supply, fill #2

## 2021-08-01 MED ORDER — MOUNJARO 10 MG/0.5ML ~~LOC~~ SOAJ
SUBCUTANEOUS | 3 refills | Status: DC
  Filled 2021-08-01: qty 3, 84d supply, fill #0

## 2021-08-23 ENCOUNTER — Encounter (HOSPITAL_COMMUNITY): Payer: Self-pay | Admitting: *Deleted

## 2021-09-23 DIAGNOSIS — H5203 Hypermetropia, bilateral: Secondary | ICD-10-CM | POA: Diagnosis not present

## 2021-09-24 ENCOUNTER — Other Ambulatory Visit: Payer: 59

## 2021-10-24 ENCOUNTER — Other Ambulatory Visit: Payer: 59

## 2021-11-05 ENCOUNTER — Other Ambulatory Visit: Payer: Self-pay | Admitting: Internal Medicine

## 2021-11-05 DIAGNOSIS — M8580 Other specified disorders of bone density and structure, unspecified site: Secondary | ICD-10-CM

## 2021-11-25 DIAGNOSIS — F418 Other specified anxiety disorders: Secondary | ICD-10-CM | POA: Diagnosis not present

## 2021-11-25 DIAGNOSIS — R5383 Other fatigue: Secondary | ICD-10-CM | POA: Diagnosis not present

## 2021-11-25 DIAGNOSIS — E559 Vitamin D deficiency, unspecified: Secondary | ICD-10-CM | POA: Diagnosis not present

## 2021-11-25 DIAGNOSIS — E785 Hyperlipidemia, unspecified: Secondary | ICD-10-CM | POA: Diagnosis not present

## 2021-11-25 DIAGNOSIS — R7989 Other specified abnormal findings of blood chemistry: Secondary | ICD-10-CM | POA: Diagnosis not present

## 2021-11-29 ENCOUNTER — Other Ambulatory Visit (HOSPITAL_COMMUNITY): Payer: Self-pay

## 2021-11-29 DIAGNOSIS — Z1331 Encounter for screening for depression: Secondary | ICD-10-CM | POA: Diagnosis not present

## 2021-11-29 DIAGNOSIS — Z1339 Encounter for screening examination for other mental health and behavioral disorders: Secondary | ICD-10-CM | POA: Diagnosis not present

## 2021-11-29 DIAGNOSIS — Z0000 Encounter for general adult medical examination without abnormal findings: Secondary | ICD-10-CM | POA: Diagnosis not present

## 2021-11-29 DIAGNOSIS — Z23 Encounter for immunization: Secondary | ICD-10-CM | POA: Diagnosis not present

## 2021-11-29 DIAGNOSIS — Z853 Personal history of malignant neoplasm of breast: Secondary | ICD-10-CM | POA: Diagnosis not present

## 2021-11-29 DIAGNOSIS — E785 Hyperlipidemia, unspecified: Secondary | ICD-10-CM | POA: Diagnosis not present

## 2021-11-29 DIAGNOSIS — Z79811 Long term (current) use of aromatase inhibitors: Secondary | ICD-10-CM | POA: Diagnosis not present

## 2021-11-29 DIAGNOSIS — E8881 Metabolic syndrome: Secondary | ICD-10-CM | POA: Diagnosis not present

## 2021-11-29 DIAGNOSIS — F418 Other specified anxiety disorders: Secondary | ICD-10-CM | POA: Diagnosis not present

## 2021-11-29 MED ORDER — LETROZOLE 2.5 MG PO TABS
2.5000 mg | ORAL_TABLET | Freq: Every day | ORAL | 3 refills | Status: AC
  Filled 2021-11-29: qty 90, 90d supply, fill #0
  Filled 2022-02-20: qty 90, 90d supply, fill #1

## 2021-11-29 MED ORDER — ESCITALOPRAM OXALATE 20 MG PO TABS
20.0000 mg | ORAL_TABLET | Freq: Every day | ORAL | 3 refills | Status: AC
  Filled 2021-11-29: qty 90, 90d supply, fill #0
  Filled 2022-02-20: qty 90, 90d supply, fill #1

## 2021-11-30 ENCOUNTER — Other Ambulatory Visit (HOSPITAL_COMMUNITY): Payer: Self-pay

## 2021-12-05 ENCOUNTER — Other Ambulatory Visit (HOSPITAL_COMMUNITY): Payer: Self-pay

## 2021-12-06 ENCOUNTER — Other Ambulatory Visit (HOSPITAL_COMMUNITY): Payer: Self-pay

## 2021-12-10 ENCOUNTER — Other Ambulatory Visit: Payer: 59

## 2021-12-18 ENCOUNTER — Other Ambulatory Visit (HOSPITAL_COMMUNITY): Payer: Self-pay

## 2021-12-20 ENCOUNTER — Other Ambulatory Visit (HOSPITAL_COMMUNITY): Payer: Self-pay

## 2021-12-23 ENCOUNTER — Other Ambulatory Visit (HOSPITAL_COMMUNITY): Payer: Self-pay

## 2021-12-24 ENCOUNTER — Other Ambulatory Visit (HOSPITAL_COMMUNITY): Payer: Self-pay

## 2021-12-25 ENCOUNTER — Other Ambulatory Visit (HOSPITAL_COMMUNITY): Payer: Self-pay

## 2021-12-26 ENCOUNTER — Other Ambulatory Visit (HOSPITAL_COMMUNITY): Payer: Self-pay

## 2021-12-27 ENCOUNTER — Ambulatory Visit (HOSPITAL_BASED_OUTPATIENT_CLINIC_OR_DEPARTMENT_OTHER): Payer: 59 | Admitting: Cardiology

## 2021-12-27 ENCOUNTER — Ambulatory Visit (INDEPENDENT_AMBULATORY_CARE_PROVIDER_SITE_OTHER): Payer: 59 | Admitting: Cardiology

## 2021-12-27 ENCOUNTER — Encounter (HOSPITAL_BASED_OUTPATIENT_CLINIC_OR_DEPARTMENT_OTHER): Payer: Self-pay | Admitting: Cardiology

## 2021-12-27 VITALS — BP 104/70 | HR 64 | Ht 67.0 in | Wt 176.0 lb

## 2021-12-27 DIAGNOSIS — Z8679 Personal history of other diseases of the circulatory system: Secondary | ICD-10-CM | POA: Diagnosis not present

## 2021-12-27 DIAGNOSIS — Z7189 Other specified counseling: Secondary | ICD-10-CM | POA: Diagnosis not present

## 2021-12-27 DIAGNOSIS — Z853 Personal history of malignant neoplasm of breast: Secondary | ICD-10-CM | POA: Diagnosis not present

## 2021-12-27 DIAGNOSIS — E7800 Pure hypercholesterolemia, unspecified: Secondary | ICD-10-CM | POA: Diagnosis not present

## 2021-12-27 NOTE — Patient Instructions (Signed)
Medication Instructions:  Your Physician recommend you continue on your current medication as directed.    *If you need a refill on your cardiac medications before your next appointment, please call your pharmacy*  Follow-Up: At [facility], you and your health needs are our priority.  As part of our continuing mission to provide you with exceptional heart care, we have created designated Provider Care Teams.  These Care Teams include your primary Cardiologist (physician) and Advanced Practice Providers (APPs -  Physician Assistants and Nurse Practitioners) who all work together to provide you with the care you need, when you need it.  We recommend signing up for the patient portal called "MyChart".  Sign up information is provided on this After Visit Summary.  MyChart is used to connect with patients for Virtual Visits (Telemedicine).  Patients are able to view lab/test results, encounter notes, upcoming appointments, etc.  Non-urgent messages can be sent to your provider as well.   To learn more about what you can do with MyChart, go to [email or web address].    Your next appointment:   1 year(s)  The format for your next appointment:   In Person  Provider:   Bridgette Christopher, MD  

## 2021-12-30 ENCOUNTER — Other Ambulatory Visit (HOSPITAL_BASED_OUTPATIENT_CLINIC_OR_DEPARTMENT_OTHER): Payer: Self-pay | Admitting: Internal Medicine

## 2021-12-30 DIAGNOSIS — M8580 Other specified disorders of bone density and structure, unspecified site: Secondary | ICD-10-CM

## 2022-01-01 DIAGNOSIS — E46 Unspecified protein-calorie malnutrition: Secondary | ICD-10-CM | POA: Diagnosis not present

## 2022-01-01 DIAGNOSIS — G43909 Migraine, unspecified, not intractable, without status migrainosus: Secondary | ICD-10-CM | POA: Diagnosis not present

## 2022-01-01 DIAGNOSIS — F419 Anxiety disorder, unspecified: Secondary | ICD-10-CM | POA: Diagnosis not present

## 2022-01-01 DIAGNOSIS — E669 Obesity, unspecified: Secondary | ICD-10-CM | POA: Diagnosis not present

## 2022-01-01 DIAGNOSIS — E663 Overweight: Secondary | ICD-10-CM | POA: Diagnosis not present

## 2022-01-01 DIAGNOSIS — K449 Diaphragmatic hernia without obstruction or gangrene: Secondary | ICD-10-CM | POA: Diagnosis not present

## 2022-01-01 DIAGNOSIS — C50912 Malignant neoplasm of unspecified site of left female breast: Secondary | ICD-10-CM | POA: Diagnosis not present

## 2022-01-01 DIAGNOSIS — E7800 Pure hypercholesterolemia, unspecified: Secondary | ICD-10-CM | POA: Diagnosis not present

## 2022-01-01 DIAGNOSIS — I10 Essential (primary) hypertension: Secondary | ICD-10-CM | POA: Diagnosis not present

## 2022-01-02 ENCOUNTER — Other Ambulatory Visit (HOSPITAL_BASED_OUTPATIENT_CLINIC_OR_DEPARTMENT_OTHER): Payer: 59

## 2022-01-03 ENCOUNTER — Other Ambulatory Visit (HOSPITAL_COMMUNITY): Payer: Self-pay

## 2022-01-20 DIAGNOSIS — Z713 Dietary counseling and surveillance: Secondary | ICD-10-CM | POA: Diagnosis not present

## 2022-01-20 DIAGNOSIS — C50912 Malignant neoplasm of unspecified site of left female breast: Secondary | ICD-10-CM | POA: Diagnosis not present

## 2022-02-11 DIAGNOSIS — C50919 Malignant neoplasm of unspecified site of unspecified female breast: Secondary | ICD-10-CM | POA: Diagnosis not present

## 2022-02-11 DIAGNOSIS — Z1231 Encounter for screening mammogram for malignant neoplasm of breast: Secondary | ICD-10-CM | POA: Diagnosis not present

## 2022-02-12 DIAGNOSIS — M8589 Other specified disorders of bone density and structure, multiple sites: Secondary | ICD-10-CM | POA: Diagnosis not present

## 2022-02-13 ENCOUNTER — Ambulatory Visit (HOSPITAL_BASED_OUTPATIENT_CLINIC_OR_DEPARTMENT_OTHER)
Admission: RE | Admit: 2022-02-13 | Discharge: 2022-02-13 | Disposition: A | Discharge status: Home / Self Care | Payer: 59 | Source: Ambulatory Visit | Attending: Internal Medicine | Admitting: Internal Medicine

## 2022-02-13 DIAGNOSIS — M8580 Other specified disorders of bone density and structure, unspecified site: Secondary | ICD-10-CM | POA: Diagnosis not present

## 2022-02-13 DIAGNOSIS — Z780 Asymptomatic menopausal state: Secondary | ICD-10-CM | POA: Diagnosis not present

## 2022-02-20 ENCOUNTER — Other Ambulatory Visit (HOSPITAL_COMMUNITY): Payer: Self-pay

## 2022-02-28 ENCOUNTER — Other Ambulatory Visit (HOSPITAL_COMMUNITY): Payer: Self-pay

## 2022-03-03 ENCOUNTER — Other Ambulatory Visit (HOSPITAL_COMMUNITY): Payer: Self-pay

## 2022-03-13 ENCOUNTER — Other Ambulatory Visit (HOSPITAL_COMMUNITY): Payer: Self-pay

## 2022-03-26 ENCOUNTER — Encounter (HOSPITAL_BASED_OUTPATIENT_CLINIC_OR_DEPARTMENT_OTHER): Payer: Self-pay | Admitting: Cardiology

## 2022-04-10 ENCOUNTER — Other Ambulatory Visit (HOSPITAL_COMMUNITY): Payer: Self-pay

## 2022-04-19 ENCOUNTER — Other Ambulatory Visit (HOSPITAL_COMMUNITY): Payer: Self-pay

## 2022-05-29 DIAGNOSIS — Z1159 Encounter for screening for other viral diseases: Secondary | ICD-10-CM | POA: Diagnosis not present

## 2022-05-29 DIAGNOSIS — Z111 Encounter for screening for respiratory tuberculosis: Secondary | ICD-10-CM | POA: Diagnosis not present

## 2022-06-04 ENCOUNTER — Other Ambulatory Visit: Payer: Self-pay

## 2022-06-05 ENCOUNTER — Other Ambulatory Visit (HOSPITAL_BASED_OUTPATIENT_CLINIC_OR_DEPARTMENT_OTHER): Payer: Self-pay

## 2022-06-05 DIAGNOSIS — Z111 Encounter for screening for respiratory tuberculosis: Secondary | ICD-10-CM | POA: Diagnosis not present

## 2022-06-05 DIAGNOSIS — E8881 Metabolic syndrome: Secondary | ICD-10-CM | POA: Diagnosis not present

## 2022-06-09 ENCOUNTER — Other Ambulatory Visit (HOSPITAL_COMMUNITY): Payer: Self-pay

## 2022-06-10 ENCOUNTER — Other Ambulatory Visit (HOSPITAL_COMMUNITY): Payer: Self-pay

## 2022-06-10 ENCOUNTER — Other Ambulatory Visit: Payer: Self-pay

## 2022-06-11 ENCOUNTER — Other Ambulatory Visit (HOSPITAL_COMMUNITY): Payer: Self-pay

## 2022-06-11 ENCOUNTER — Other Ambulatory Visit: Payer: Self-pay

## 2022-06-11 MED ORDER — ZEPBOUND 12.5 MG/0.5ML ~~LOC~~ SOAJ
12.5000 mg | SUBCUTANEOUS | 0 refills | Status: AC
  Filled 2022-06-11: qty 2, 28d supply, fill #0

## 2022-07-11 ENCOUNTER — Encounter: Payer: Self-pay | Admitting: Hematology and Oncology

## 2022-07-11 DIAGNOSIS — C50919 Malignant neoplasm of unspecified site of unspecified female breast: Secondary | ICD-10-CM

## 2022-07-12 ENCOUNTER — Other Ambulatory Visit: Payer: Self-pay | Admitting: Hematology and Oncology

## 2022-07-12 DIAGNOSIS — C50919 Malignant neoplasm of unspecified site of unspecified female breast: Secondary | ICD-10-CM

## 2022-07-28 ENCOUNTER — Ambulatory Visit
Admission: RE | Admit: 2022-07-28 | Discharge: 2022-07-28 | Disposition: A | Discharge status: Home / Self Care | Payer: Self-pay | Source: Ambulatory Visit | Attending: Hematology and Oncology | Admitting: Hematology and Oncology

## 2022-07-28 DIAGNOSIS — Z853 Personal history of malignant neoplasm of breast: Secondary | ICD-10-CM | POA: Diagnosis not present

## 2022-07-28 DIAGNOSIS — Z9189 Other specified personal risk factors, not elsewhere classified: Secondary | ICD-10-CM | POA: Diagnosis not present

## 2022-07-28 DIAGNOSIS — C50919 Malignant neoplasm of unspecified site of unspecified female breast: Secondary | ICD-10-CM

## 2022-07-28 MED ORDER — GADOPICLENOL 0.5 MMOL/ML IV SOLN
7.5000 mL | Freq: Once | INTRAVENOUS | Status: AC | PRN
  Administered 2022-07-28: 7.5 mL via INTRAVENOUS

## 2022-08-07 ENCOUNTER — Other Ambulatory Visit: Payer: Self-pay | Admitting: Hematology and Oncology

## 2022-08-07 DIAGNOSIS — R928 Other abnormal and inconclusive findings on diagnostic imaging of breast: Secondary | ICD-10-CM

## 2022-08-13 DIAGNOSIS — Z171 Estrogen receptor negative status [ER-]: Secondary | ICD-10-CM | POA: Diagnosis not present

## 2022-08-13 DIAGNOSIS — C50211 Malignant neoplasm of upper-inner quadrant of right female breast: Secondary | ICD-10-CM | POA: Diagnosis not present

## 2022-08-21 DIAGNOSIS — Z853 Personal history of malignant neoplasm of breast: Secondary | ICD-10-CM | POA: Diagnosis not present

## 2022-08-21 DIAGNOSIS — C50211 Malignant neoplasm of upper-inner quadrant of right female breast: Secondary | ICD-10-CM | POA: Diagnosis not present

## 2022-08-21 DIAGNOSIS — Z79811 Long term (current) use of aromatase inhibitors: Secondary | ICD-10-CM | POA: Diagnosis not present

## 2022-08-21 DIAGNOSIS — M8580 Other specified disorders of bone density and structure, unspecified site: Secondary | ICD-10-CM | POA: Diagnosis not present

## 2022-08-21 DIAGNOSIS — N6489 Other specified disorders of breast: Secondary | ICD-10-CM | POA: Diagnosis not present

## 2022-08-21 DIAGNOSIS — N63 Unspecified lump in unspecified breast: Secondary | ICD-10-CM | POA: Diagnosis not present

## 2022-08-21 DIAGNOSIS — Z9889 Other specified postprocedural states: Secondary | ICD-10-CM | POA: Diagnosis not present

## 2022-08-21 DIAGNOSIS — C50919 Malignant neoplasm of unspecified site of unspecified female breast: Secondary | ICD-10-CM | POA: Diagnosis not present

## 2022-08-21 DIAGNOSIS — E559 Vitamin D deficiency, unspecified: Secondary | ICD-10-CM | POA: Diagnosis not present

## 2022-08-21 DIAGNOSIS — Z171 Estrogen receptor negative status [ER-]: Secondary | ICD-10-CM | POA: Diagnosis not present

## 2022-08-21 DIAGNOSIS — C50411 Malignant neoplasm of upper-outer quadrant of right female breast: Secondary | ICD-10-CM | POA: Diagnosis not present

## 2022-08-21 DIAGNOSIS — Z923 Personal history of irradiation: Secondary | ICD-10-CM | POA: Diagnosis not present

## 2022-09-01 DIAGNOSIS — C50919 Malignant neoplasm of unspecified site of unspecified female breast: Secondary | ICD-10-CM | POA: Diagnosis not present

## 2022-09-01 DIAGNOSIS — N6312 Unspecified lump in the right breast, upper inner quadrant: Secondary | ICD-10-CM | POA: Diagnosis not present

## 2022-09-01 DIAGNOSIS — C50211 Malignant neoplasm of upper-inner quadrant of right female breast: Secondary | ICD-10-CM | POA: Diagnosis not present

## 2022-09-09 ENCOUNTER — Encounter (HOSPITAL_COMMUNITY): Payer: Self-pay | Admitting: *Deleted

## 2022-09-09 DIAGNOSIS — Z7189 Other specified counseling: Secondary | ICD-10-CM | POA: Diagnosis not present

## 2022-09-09 DIAGNOSIS — Z08 Encounter for follow-up examination after completed treatment for malignant neoplasm: Secondary | ICD-10-CM | POA: Diagnosis not present

## 2022-09-09 DIAGNOSIS — Z853 Personal history of malignant neoplasm of breast: Secondary | ICD-10-CM | POA: Diagnosis not present

## 2022-09-12 DIAGNOSIS — Z853 Personal history of malignant neoplasm of breast: Secondary | ICD-10-CM | POA: Diagnosis not present

## 2022-09-12 DIAGNOSIS — Z9884 Bariatric surgery status: Secondary | ICD-10-CM | POA: Diagnosis not present

## 2022-09-12 DIAGNOSIS — I10 Essential (primary) hypertension: Secondary | ICD-10-CM | POA: Diagnosis not present

## 2022-09-12 DIAGNOSIS — Z01818 Encounter for other preprocedural examination: Secondary | ICD-10-CM | POA: Diagnosis not present

## 2022-09-22 DIAGNOSIS — Z4001 Encounter for prophylactic removal of breast: Secondary | ICD-10-CM | POA: Diagnosis not present

## 2022-09-22 DIAGNOSIS — Z9889 Other specified postprocedural states: Secondary | ICD-10-CM | POA: Diagnosis not present

## 2022-09-22 DIAGNOSIS — Z853 Personal history of malignant neoplasm of breast: Secondary | ICD-10-CM | POA: Diagnosis not present

## 2022-09-22 DIAGNOSIS — Z923 Personal history of irradiation: Secondary | ICD-10-CM | POA: Diagnosis not present

## 2022-09-22 DIAGNOSIS — Z87891 Personal history of nicotine dependence: Secondary | ICD-10-CM | POA: Diagnosis not present

## 2022-09-22 DIAGNOSIS — I10 Essential (primary) hypertension: Secondary | ICD-10-CM | POA: Diagnosis not present

## 2022-09-22 DIAGNOSIS — Z171 Estrogen receptor negative status [ER-]: Secondary | ICD-10-CM | POA: Diagnosis not present

## 2022-09-22 DIAGNOSIS — C50211 Malignant neoplasm of upper-inner quadrant of right female breast: Secondary | ICD-10-CM | POA: Diagnosis not present

## 2022-09-22 DIAGNOSIS — G8918 Other acute postprocedural pain: Secondary | ICD-10-CM | POA: Diagnosis not present

## 2022-09-30 DIAGNOSIS — Z853 Personal history of malignant neoplasm of breast: Secondary | ICD-10-CM | POA: Diagnosis not present

## 2022-09-30 DIAGNOSIS — C50911 Malignant neoplasm of unspecified site of right female breast: Secondary | ICD-10-CM | POA: Diagnosis not present

## 2022-10-14 DIAGNOSIS — C50911 Malignant neoplasm of unspecified site of right female breast: Secondary | ICD-10-CM | POA: Diagnosis not present

## 2022-10-14 DIAGNOSIS — I517 Cardiomegaly: Secondary | ICD-10-CM | POA: Diagnosis not present

## 2022-10-14 DIAGNOSIS — C50211 Malignant neoplasm of upper-inner quadrant of right female breast: Secondary | ICD-10-CM | POA: Diagnosis not present

## 2022-10-14 DIAGNOSIS — Z171 Estrogen receptor negative status [ER-]: Secondary | ICD-10-CM | POA: Diagnosis not present

## 2022-10-14 DIAGNOSIS — Z79899 Other long term (current) drug therapy: Secondary | ICD-10-CM | POA: Diagnosis not present

## 2022-10-14 DIAGNOSIS — Z9013 Acquired absence of bilateral breasts and nipples: Secondary | ICD-10-CM | POA: Diagnosis not present

## 2022-10-14 DIAGNOSIS — Z9049 Acquired absence of other specified parts of digestive tract: Secondary | ICD-10-CM | POA: Diagnosis not present

## 2022-10-14 DIAGNOSIS — Z79811 Long term (current) use of aromatase inhibitors: Secondary | ICD-10-CM | POA: Diagnosis not present

## 2022-10-16 DIAGNOSIS — C50411 Malignant neoplasm of upper-outer quadrant of right female breast: Secondary | ICD-10-CM | POA: Diagnosis not present

## 2022-10-16 DIAGNOSIS — Z171 Estrogen receptor negative status [ER-]: Secondary | ICD-10-CM | POA: Diagnosis not present

## 2022-10-16 DIAGNOSIS — Z9013 Acquired absence of bilateral breasts and nipples: Secondary | ICD-10-CM | POA: Diagnosis not present

## 2022-10-23 DIAGNOSIS — E782 Mixed hyperlipidemia: Secondary | ICD-10-CM | POA: Diagnosis not present

## 2022-10-23 DIAGNOSIS — Z9049 Acquired absence of other specified parts of digestive tract: Secondary | ICD-10-CM | POA: Diagnosis not present

## 2022-10-23 DIAGNOSIS — C50411 Malignant neoplasm of upper-outer quadrant of right female breast: Secondary | ICD-10-CM | POA: Diagnosis not present

## 2022-10-23 DIAGNOSIS — F419 Anxiety disorder, unspecified: Secondary | ICD-10-CM | POA: Diagnosis not present

## 2022-10-23 DIAGNOSIS — F32A Depression, unspecified: Secondary | ICD-10-CM | POA: Diagnosis not present

## 2022-10-23 DIAGNOSIS — Z1379 Encounter for other screening for genetic and chromosomal anomalies: Secondary | ICD-10-CM | POA: Diagnosis not present

## 2022-10-23 DIAGNOSIS — M8580 Other specified disorders of bone density and structure, unspecified site: Secondary | ICD-10-CM | POA: Diagnosis not present

## 2022-10-23 DIAGNOSIS — Z87891 Personal history of nicotine dependence: Secondary | ICD-10-CM | POA: Diagnosis not present

## 2022-10-23 DIAGNOSIS — I10 Essential (primary) hypertension: Secondary | ICD-10-CM | POA: Diagnosis not present

## 2022-10-23 DIAGNOSIS — Z5111 Encounter for antineoplastic chemotherapy: Secondary | ICD-10-CM | POA: Diagnosis not present

## 2022-10-23 DIAGNOSIS — Z171 Estrogen receptor negative status [ER-]: Secondary | ICD-10-CM | POA: Diagnosis not present

## 2022-10-23 DIAGNOSIS — Z9884 Bariatric surgery status: Secondary | ICD-10-CM | POA: Diagnosis not present

## 2022-10-23 DIAGNOSIS — Z9013 Acquired absence of bilateral breasts and nipples: Secondary | ICD-10-CM | POA: Diagnosis not present

## 2022-10-23 DIAGNOSIS — Z853 Personal history of malignant neoplasm of breast: Secondary | ICD-10-CM | POA: Diagnosis not present

## 2022-10-23 DIAGNOSIS — Z923 Personal history of irradiation: Secondary | ICD-10-CM | POA: Diagnosis not present

## 2022-11-13 DIAGNOSIS — F341 Dysthymic disorder: Secondary | ICD-10-CM | POA: Diagnosis not present

## 2022-11-13 DIAGNOSIS — Z9884 Bariatric surgery status: Secondary | ICD-10-CM | POA: Diagnosis not present

## 2022-11-13 DIAGNOSIS — F419 Anxiety disorder, unspecified: Secondary | ICD-10-CM | POA: Diagnosis not present

## 2022-11-13 DIAGNOSIS — Z87891 Personal history of nicotine dependence: Secondary | ICD-10-CM | POA: Diagnosis not present

## 2022-11-13 DIAGNOSIS — Z171 Estrogen receptor negative status [ER-]: Secondary | ICD-10-CM | POA: Diagnosis not present

## 2022-11-13 DIAGNOSIS — Z79899 Other long term (current) drug therapy: Secondary | ICD-10-CM | POA: Diagnosis not present

## 2022-11-13 DIAGNOSIS — I10 Essential (primary) hypertension: Secondary | ICD-10-CM | POA: Diagnosis not present

## 2022-11-13 DIAGNOSIS — C50411 Malignant neoplasm of upper-outer quadrant of right female breast: Secondary | ICD-10-CM | POA: Diagnosis not present

## 2022-11-13 DIAGNOSIS — E785 Hyperlipidemia, unspecified: Secondary | ICD-10-CM | POA: Diagnosis not present

## 2022-11-13 DIAGNOSIS — Z5111 Encounter for antineoplastic chemotherapy: Secondary | ICD-10-CM | POA: Diagnosis not present

## 2022-11-15 ENCOUNTER — Encounter (HOSPITAL_COMMUNITY): Payer: Self-pay

## 2022-11-17 ENCOUNTER — Emergency Department (HOSPITAL_COMMUNITY): Payer: BC Managed Care – PPO

## 2022-11-17 ENCOUNTER — Emergency Department (HOSPITAL_COMMUNITY)
Admission: EM | Admit: 2022-11-17 | Discharge: 2022-11-18 | Disposition: A | Discharge status: Home / Self Care | Payer: BC Managed Care – PPO | Attending: Emergency Medicine | Admitting: Emergency Medicine

## 2022-11-17 ENCOUNTER — Other Ambulatory Visit: Payer: Self-pay

## 2022-11-17 DIAGNOSIS — S00212A Abrasion of left eyelid and periocular area, initial encounter: Secondary | ICD-10-CM | POA: Diagnosis not present

## 2022-11-17 DIAGNOSIS — R110 Nausea: Secondary | ICD-10-CM | POA: Diagnosis not present

## 2022-11-17 DIAGNOSIS — Z853 Personal history of malignant neoplasm of breast: Secondary | ICD-10-CM | POA: Diagnosis not present

## 2022-11-17 DIAGNOSIS — R109 Unspecified abdominal pain: Secondary | ICD-10-CM | POA: Insufficient documentation

## 2022-11-17 DIAGNOSIS — R55 Syncope and collapse: Secondary | ICD-10-CM | POA: Diagnosis not present

## 2022-11-17 DIAGNOSIS — W01198A Fall on same level from slipping, tripping and stumbling with subsequent striking against other object, initial encounter: Secondary | ICD-10-CM | POA: Diagnosis not present

## 2022-11-17 DIAGNOSIS — S0291XA Unspecified fracture of skull, initial encounter for closed fracture: Secondary | ICD-10-CM | POA: Diagnosis not present

## 2022-11-17 DIAGNOSIS — Z7982 Long term (current) use of aspirin: Secondary | ICD-10-CM | POA: Diagnosis not present

## 2022-11-17 DIAGNOSIS — M4802 Spinal stenosis, cervical region: Secondary | ICD-10-CM | POA: Diagnosis not present

## 2022-11-17 DIAGNOSIS — R220 Localized swelling, mass and lump, head: Secondary | ICD-10-CM | POA: Diagnosis not present

## 2022-11-17 DIAGNOSIS — S0592XA Unspecified injury of left eye and orbit, initial encounter: Secondary | ICD-10-CM | POA: Diagnosis not present

## 2022-11-17 DIAGNOSIS — S022XXA Fracture of nasal bones, initial encounter for closed fracture: Secondary | ICD-10-CM

## 2022-11-17 DIAGNOSIS — K760 Fatty (change of) liver, not elsewhere classified: Secondary | ICD-10-CM | POA: Diagnosis not present

## 2022-11-17 DIAGNOSIS — Z9049 Acquired absence of other specified parts of digestive tract: Secondary | ICD-10-CM | POA: Diagnosis not present

## 2022-11-17 DIAGNOSIS — R7309 Other abnormal glucose: Secondary | ICD-10-CM

## 2022-11-17 DIAGNOSIS — I959 Hypotension, unspecified: Secondary | ICD-10-CM | POA: Diagnosis not present

## 2022-11-17 DIAGNOSIS — R42 Dizziness and giddiness: Secondary | ICD-10-CM | POA: Diagnosis not present

## 2022-11-17 DIAGNOSIS — Z90710 Acquired absence of both cervix and uterus: Secondary | ICD-10-CM | POA: Diagnosis not present

## 2022-11-17 DIAGNOSIS — M47812 Spondylosis without myelopathy or radiculopathy, cervical region: Secondary | ICD-10-CM | POA: Diagnosis not present

## 2022-11-17 LAB — I-STAT CHEM 8, ED
BUN: 20 mg/dL (ref 6–20)
Calcium, Ion: 1.16 mmol/L (ref 1.15–1.40)
Chloride: 102 mmol/L (ref 98–111)
Creatinine, Ser: 0.8 mg/dL (ref 0.44–1.00)
Glucose, Bld: 113 mg/dL — ABNORMAL HIGH (ref 70–99)
HCT: 37 % (ref 36.0–46.0)
Hemoglobin: 12.6 g/dL (ref 12.0–15.0)
Potassium: 3.8 mmol/L (ref 3.5–5.1)
Sodium: 137 mmol/L (ref 135–145)
TCO2: 23 mmol/L (ref 22–32)

## 2022-11-17 LAB — BASIC METABOLIC PANEL
Anion gap: 10 (ref 5–15)
BUN: 21 mg/dL — ABNORMAL HIGH (ref 6–20)
CO2: 24 mmol/L (ref 22–32)
Calcium: 9 mg/dL (ref 8.9–10.3)
Chloride: 102 mmol/L (ref 98–111)
Creatinine, Ser: 0.65 mg/dL (ref 0.44–1.00)
GFR, Estimated: >60 mL/min (ref >60)
Glucose, Bld: 114 mg/dL — ABNORMAL HIGH (ref 70–99)
Potassium: 3.7 mmol/L (ref 3.5–5.1)
Sodium: 136 mmol/L (ref 135–145)

## 2022-11-17 LAB — CBC WITH DIFFERENTIAL/PLATELET
Abs Immature Granulocytes: 1.37 10*3/uL — ABNORMAL HIGH (ref 0.00–0.07)
Basophils Absolute: 0 10*3/uL (ref 0.0–0.1)
Basophils Relative: 0 %
Eosinophils Absolute: 0.3 10*3/uL (ref 0.0–0.5)
Eosinophils Relative: 4 %
HCT: 36.4 % (ref 36.0–46.0)
Hemoglobin: 12.3 g/dL (ref 12.0–15.0)
Immature Granulocytes: 18 %
Lymphocytes Relative: 7 %
Lymphs Abs: 0.6 10*3/uL — ABNORMAL LOW (ref 0.7–4.0)
MCH: 32.2 pg (ref 26.0–34.0)
MCHC: 33.8 g/dL (ref 30.0–36.0)
MCV: 95.3 fL (ref 80.0–100.0)
Monocytes Absolute: 0.1 10*3/uL (ref 0.1–1.0)
Monocytes Relative: 1 %
Neutro Abs: 5.2 10*3/uL (ref 1.7–7.7)
Neutrophils Relative %: 70 %
Platelets: 175 10*3/uL (ref 150–400)
RBC: 3.82 MIL/uL — ABNORMAL LOW (ref 3.87–5.11)
RDW: 12 % (ref 11.5–15.5)
WBC: 7.6 10*3/uL (ref 4.0–10.5)
nRBC: 0 % (ref 0.0–0.2)

## 2022-11-17 LAB — PROTIME-INR
INR: 0.9 (ref 0.8–1.2)
Prothrombin Time: 12.7 seconds (ref 11.4–15.2)

## 2022-11-17 LAB — HEPATIC FUNCTION PANEL
ALT: 23 U/L (ref 0–44)
AST: 25 U/L (ref 15–41)
Albumin: 3.6 g/dL (ref 3.5–5.0)
Alkaline Phosphatase: 82 U/L (ref 38–126)
Bilirubin, Direct: 0.1 mg/dL (ref 0.0–0.2)
Indirect Bilirubin: 0.9 mg/dL (ref 0.3–0.9)
Total Bilirubin: 1 mg/dL (ref 0.3–1.2)
Total Protein: 6.7 g/dL (ref 6.5–8.1)

## 2022-11-17 LAB — CBG MONITORING, ED: Glucose-Capillary: 73 mg/dL (ref 70–99)

## 2022-11-17 LAB — TROPONIN I (HIGH SENSITIVITY): Troponin I (High Sensitivity): 3 ng/L (ref <18)

## 2022-11-17 LAB — MAGNESIUM: Magnesium: 1.9 mg/dL (ref 1.7–2.4)

## 2022-11-17 MED ORDER — LACTATED RINGERS IV BOLUS
1000.0000 mL | Freq: Once | INTRAVENOUS | Status: AC
  Administered 2022-11-17: 1000 mL via INTRAVENOUS

## 2022-11-17 MED ORDER — IOHEXOL 350 MG/ML SOLN
100.0000 mL | Freq: Once | INTRAVENOUS | Status: AC | PRN
  Administered 2022-11-17: 100 mL via INTRAVENOUS

## 2022-11-17 MED ORDER — OXYCODONE-ACETAMINOPHEN 5-325 MG PO TABS
1.0000 | ORAL_TABLET | Freq: Once | ORAL | Status: AC
  Administered 2022-11-18: 1 via ORAL
  Filled 2022-11-17: qty 1

## 2022-11-17 NOTE — ED Triage Notes (Signed)
Patient BIB EMS from home c/o syncope episode. Per report patient fell forward and hit her face in the wall. Patient sustain laceration on her nose. Patient report nausea and diarrhea since chemotherapy 4 days ago. Patient hx of Breast cancer.

## 2022-11-17 NOTE — ED Provider Notes (Signed)
[place] EMERGENCY DEPARTMENT AT [facility] Provider Note   CSN: [id number] Arrival date & time: 11/17/22  2209     History  Chief Complaint  Patient presents with   Loss of Consciousness    Alexis Chase is a 52 y.o. female.   Loss of Consciousness Associated symptoms: dizziness and nausea   Patient presents for syncope and fall.  Medical history includes HLD, breast cancer, HTN, anemia.  She underwent double mastectomy 2 months ago at [facility].  She is on chemotherapy.  Last chemotherapy was 4 days ago.  Earlier today, she had poor p.o. intake.  She had abdominal upset and had diarrhea.  She is unsure if there was any presence of blood in her diarrhea.  She went to get up off of the toilet, she fell forward.  She stood and walked out of the bathroom.  She then had a syncopal episode.  During this episode, she fell from standing and struck her face on the wall.  Currently, she endorses pain in the area of her face but denies any other areas of discomfort.  EMS noted soft blood pressures on scene of 100s SBP.  No interventions were given prior to arrival.  Blood pressure did improve to 120s SBP prior to arrival.  Patient is unsure of when last tetanus shot was.     Home Medications Prior to Admission medications   Medication Sig Start Date End Date Taking? Authorizing Provider  aspirin EC 81 MG tablet Take 81 mg by mouth daily.    [provider]  atorvastatin (LIPITOR) 20 MG tablet TAKE 1 TABLET BY MOUTH ONCE DAILY 05/21/21     Calcium 500-100 MG-UNIT CHEW Chew 3 each by mouth daily. 08/20/18   Helane Rima, DO  Cholecalciferol (VITAMIN D) 125 MCG (5000 UT) CAPS Take 5,000 Units by mouth daily. 12/14/18   Helane Rima, DO  denosumab (PROLIA) 60 MG/ML SOSY injection Inject 60 mg into the skin every 6 (six) months. 07/08/21   Quentin Angst, MD  escitalopram (LEXAPRO) 20 MG tablet Take 1 tablet (20 mg total) by mouth daily. 11/29/21     letrozole (FEMARA) 2.5  MG tablet Take 1 tablet (2.5 mg total) by mouth daily. 11/29/21     Multiple Vitamin (MULTIVITAMIN) tablet Take 1 tablet by mouth 2 (two) times daily. [employer]    [provider]  tirzepatide Greggory Keen) 10 MG/0.5ML Pen Inject 1 pen (10 mg) into the skin once a week 08/01/21     tirzepatide (ZEPBOUND) 12.5 MG/0.5ML Pen Inject 12.5 mg into the skin every 7 (seven) days. 06/11/22         Allergies    Patient has no known allergies.    Review of Systems   Review of Systems  HENT:  Positive for facial swelling.   Cardiovascular:  Positive for syncope.  Gastrointestinal:  Positive for abdominal pain, diarrhea and nausea.  Neurological:  Positive for dizziness, syncope and light-headedness.  All other systems reviewed and are negative.   Physical Exam Updated Vital Signs BP (!) 152/65   Pulse 74   Temp 98 F (36.7 C)   Resp 17   Ht 5\' 7"  (1.702 m)   Wt 80 kg   SpO2 100%   BMI 27.62 kg/m  Physical Exam Vitals and nursing note reviewed.  Constitutional:      General: She is not in acute distress.    Appearance: Normal appearance. She is well-developed. She is not ill-appearing, toxic-appearing or diaphoretic.  HENT:     Head: Normocephalic.     Comments: Superficial abrasions in the left periorbital area.    Right Ear: External ear normal.     Left Ear: External ear normal.     Nose:     Comments: Swelling to bridge of nose.  Dried blood in bilateral nares.    Mouth/Throat:     Mouth: Mucous membranes are moist.  Eyes:     Extraocular Movements: Extraocular movements intact.     Conjunctiva/sclera: Conjunctivae normal.  Cardiovascular:     Rate and Rhythm: Normal rate and regular rhythm.     Heart sounds: No murmur heard. Pulmonary:     Effort: Pulmonary effort is normal. No respiratory distress.     Breath sounds: Normal breath sounds. No wheezing or rales.  Abdominal:     General: There is no distension.     Palpations: Abdomen is soft.     Tenderness:  There is abdominal tenderness.  Musculoskeletal:        General: No swelling. Normal range of motion.     Cervical back: Neck supple.     Right lower leg: No edema.     Left lower leg: No edema.  Skin:    General: Skin is warm and dry.     Coloration: Skin is not jaundiced or pale.  Neurological:     General: No focal deficit present.     Mental Status: She is alert and oriented to person, place, and time.  Psychiatric:        Mood and Affect: Mood normal.        Behavior: Behavior normal.     ED Results / Procedures / Treatments   Labs (all labs ordered are listed, but only abnormal results are displayed) Labs Reviewed  BASIC METABOLIC PANEL  CBC WITH DIFFERENTIAL/PLATELET  HEPATIC FUNCTION PANEL  MAGNESIUM  URINALYSIS, ROUTINE W REFLEX MICROSCOPIC  PROTIME-INR  CBG MONITORING, ED  I-STAT CHEM 8, ED  TYPE AND SCREEN  TROPONIN I (HIGH SENSITIVITY)    EKG None  Radiology No results found.  Procedures Procedures    Medications Ordered in ED Medications  lactated ringers bolus 1,000 mL (has no administration in time range)  oxyCODONE-acetaminophen (PERCOCET/ROXICET) 5-325 MG per tablet 1 tablet (has no administration in time range)    ED Course/ Medical Decision Making/ A&P                                 Medical Decision Making Amount and/or Complexity of Data Reviewed Labs: ordered. Radiology: ordered. ECG/medicine tests: ordered.  Risk Prescription drug management.   This patient presents to the ED for concern of syncope and fall, this involves an extensive number of treatment options, and is a complaint that carries with it a high risk of complications and morbidity.  The differential diagnosis includes acute injuries, dehydration, anemia, derangements, arrhythmia, vasovagal episode   Co morbidities that complicate the patient evaluation  HLD, breast cancer, HTN, anemia   Additional history obtained:  Additional history obtained from  EMS External records from outside source obtained and reviewed including EMR   Lab Tests:  I Ordered, and personally interpreted labs.  The pertinent results include: (Pending at time of signout)   Imaging Studies ordered:  I ordered imaging studies including CT of head, face, cervical spine, CTA chest, CT of abdomen and pelvis I independently visualized and interpreted imaging which showed (pending at time  of signout) I agree with the radiologist interpretation   Cardiac Monitoring: / EKG:  The patient was maintained on a cardiac monitor.  I personally viewed and interpreted the cardiac monitored which showed an underlying rhythm of: Sinus rhythm  Problem List / ED Course / Critical interventions / Medication management  Patient presents for syncope and fall.  During this fall, she did strike her face against a wall at home.  On arrival, she is alert and oriented.  She has swelling to bridge of nose and dried blood in bilateral nares.  She has minor abrasions to left periorbital area.  No obtain CT imaging of head, face, and cervical spine.  Additionally, patient has had abdominal pain earlier today.  Tenderness is present on exam.  Will obtain additional CT imaging of abdomen pelvis.  Given her syncopal episode and cancer history, there is concern of PE as well.  Will obtain CTA of chest.  Percocet was ordered for analgesia.  Per chart review, last Tdap was 2019.  Workup results were pending at time of signout.  Care of patient was signed out to oncoming ED provider. I ordered medication including Percocet for analgesia; IV fluids for hydration Reevaluation of the patient after these medicines showed that the patient improved I have reviewed the patients home medicines and have made adjustments as needed   Social Determinants of Health:  Has access to outpatient care         Final Clinical Impression(s) / ED Diagnoses Final diagnoses:  Syncope and collapse    Rx / DC  Orders ED Discharge Orders     None         Gloris Manchester, MD 11/17/22 2248

## 2022-11-17 NOTE — ED Provider Notes (Signed)
Care assumed from Dr. Durwin Nora, patient with syncope and facial trauma, also chronic abdominal pain. CT scans are pending, anticipate discharge if negative for acute process.  CT scans show nasal fracture but no other acute injury.  I have independently viewed all of the images, and agree with the radiologist's interpretation.  Repeat troponin is normal.  I have advised the patient on the need to apply ice, use over-the-counter NSAIDs and acetaminophen as needed for pain.  I am referring her to maxillofacial trauma for follow-up.  Results for orders placed or performed during the hospital encounter of 11/17/22  Basic metabolic panel  Result Value Ref Range   Sodium 136 135 - 145 mmol/L   Potassium 3.7 3.5 - 5.1 mmol/L   Chloride 102 98 - 111 mmol/L   CO2 24 22 - 32 mmol/L   Glucose, Bld 114 (H) 70 - 99 mg/dL   BUN 21 (H) 6 - 20 mg/dL   Creatinine, Ser 2.44 0.44 - 1.00 mg/dL   Calcium 9.0 8.9 - 01.0 mg/dL   GFR, Estimated >27 >25 mL/min   Anion gap 10 5 - 15  CBC WITH DIFFERENTIAL  Result Value Ref Range   WBC 7.6 4.0 - 10.5 K/uL   RBC 3.82 (L) 3.87 - 5.11 MIL/uL   Hemoglobin 12.3 12.0 - 15.0 g/dL   HCT 36.6 44.0 - 34.7 %   MCV 95.3 80.0 - 100.0 fL   MCH 32.2 26.0 - 34.0 pg   MCHC 33.8 30.0 - 36.0 g/dL   RDW 42.5 95.6 - 38.7 %   Platelets 175 150 - 400 K/uL   nRBC 0.0 0.0 - 0.2 %   Neutrophils Relative % 70 %   Neutro Abs 5.2 1.7 - 7.7 K/uL   Lymphocytes Relative 7 %   Lymphs Abs 0.6 (L) 0.7 - 4.0 K/uL   Monocytes Relative 1 %   Monocytes Absolute 0.1 0.1 - 1.0 K/uL   Eosinophils Relative 4 %   Eosinophils Absolute 0.3 0.0 - 0.5 K/uL   Basophils Relative 0 %   Basophils Absolute 0.0 0.0 - 0.1 K/uL   Immature Granulocytes 18 %   Abs Immature Granulocytes 1.37 (H) 0.00 - 0.07 K/uL  Hepatic function panel  Result Value Ref Range   Total Protein 6.7 6.5 - 8.1 g/dL   Albumin 3.6 3.5 - 5.0 g/dL   AST 25 15 - 41 U/L   ALT 23 0 - 44 U/L   Alkaline Phosphatase 82 38 - 126 U/L    Total Bilirubin 1.0 0.3 - 1.2 mg/dL   Bilirubin, Direct 0.1 0.0 - 0.2 mg/dL   Indirect Bilirubin 0.9 0.3 - 0.9 mg/dL  Magnesium  Result Value Ref Range   Magnesium 1.9 1.7 - 2.4 mg/dL  Urinalysis, Routine w reflex microscopic -Urine, Clean Catch  Result Value Ref Range   Color, Urine YELLOW YELLOW   APPearance CLEAR CLEAR   Specific Gravity, Urine 1.032 (H) 1.005 - 1.030   pH 8.0 5.0 - 8.0   Glucose, UA NEGATIVE NEGATIVE mg/dL   Hgb urine dipstick NEGATIVE NEGATIVE   Bilirubin Urine NEGATIVE NEGATIVE   Ketones, ur NEGATIVE NEGATIVE mg/dL   Protein, ur NEGATIVE NEGATIVE mg/dL   Nitrite NEGATIVE NEGATIVE   Leukocytes,Ua SMALL (A) NEGATIVE   RBC / HPF 0-5 0 - 5 RBC/hpf   WBC, UA 6-10 0 - 5 WBC/hpf   Bacteria, UA NONE SEEN NONE SEEN   Squamous Epithelial / HPF 0-5 0 - 5 /HPF  Protime-INR  Result  Value Ref Range   Prothrombin Time 12.7 11.4 - 15.2 seconds   INR 0.9 0.8 - 1.2  CBG monitoring, ED  Result Value Ref Range   Glucose-Capillary 73 70 - 99 mg/dL  I-Stat Chem 8, ED  Result Value Ref Range   Sodium 137 135 - 145 mmol/L   Potassium 3.8 3.5 - 5.1 mmol/L   Chloride 102 98 - 111 mmol/L   BUN 20 6 - 20 mg/dL   Creatinine, Ser 2.59 0.44 - 1.00 mg/dL   Glucose, Bld 563 (H) 70 - 99 mg/dL   Calcium, Ion 8.75 6.43 - 1.40 mmol/L   TCO2 23 22 - 32 mmol/L   Hemoglobin 12.6 12.0 - 15.0 g/dL   HCT 32.9 51.8 - 84.1 %  Type and screen [facility]  Result Value Ref Range   ABO/RH(D) O POS    Antibody Screen NEG    Sample Expiration      11/20/2022,2359 Performed at [facility], [address], [place], [place] [address]   Troponin I (High Sensitivity)  Result Value Ref Range   Troponin I (High Sensitivity) 3 <18 ng/L  Troponin I (High Sensitivity)  Result Value Ref Range   Troponin I (High Sensitivity) 3 <18 ng/L   CT Maxillofacial Wo Contrast  Result Date: 11/18/2022 CLINICAL DATA:  Syncope and subsequent fall. EXAM: CT MAXILLOFACIAL  WITHOUT CONTRAST TECHNIQUE: Multidetector CT imaging of the maxillofacial structures was performed. Multiplanar CT image reconstructions were also generated. RADIATION DOSE REDUCTION: This exam was performed according to the departmental dose-optimization program which includes automated exposure control, adjustment of the mA and/or kV according to patient size and/or use of iterative reconstruction technique. COMPARISON:  None Available. FINDINGS: Osseous: Bilateral anterior nondisplaced nasal bone fractures are seen. Orbits: Negative. No traumatic or inflammatory finding. Sinuses: Clear. Soft tissues: There is mild left paranasal, medial left periorbital and left frontal scalp soft tissue swelling. Limited intracranial: No significant or unexpected finding. IMPRESSION: 1. Bilateral anterior nondisplaced nasal bone fractures. 2. Mild left paranasal, medial left periorbital and left frontal scalp soft tissue swelling. Electronically Signed   By: Aram Candela M.D.   On: 11/18/2022 01:35   CT ABDOMEN PELVIS W CONTRAST  Result Date: 11/18/2022 CLINICAL DATA:  Syncope and subsequent fall. EXAM: CT ABDOMEN AND PELVIS WITH CONTRAST TECHNIQUE: Multidetector CT imaging of the abdomen and pelvis was performed using the standard protocol following bolus administration of intravenous contrast. RADIATION DOSE REDUCTION: This exam was performed according to the departmental dose-optimization program which includes automated exposure control, adjustment of the mA and/or kV according to patient size and/or use of iterative reconstruction technique. CONTRAST:  OMNIPAQUE IOHEXOL 350 MG/ML SOLN COMPARISON:  None Available. FINDINGS: Lower chest: No acute abnormality. Hepatobiliary: There is diffuse fatty infiltration of the liver parenchyma. No focal liver abnormality is seen. Status post cholecystectomy. No biliary dilatation. Pancreas: Unremarkable. No pancreatic ductal dilatation or surrounding inflammatory changes.  Spleen: Normal in size without focal abnormality. Adrenals/Urinary Tract: Adrenal glands are unremarkable. Kidneys are normal, without renal calculi, focal lesion, or hydronephrosis. Bladder is unremarkable. Stomach/Bowel: Surgical sutures are seen throughout the gastric region. Appendix appears normal. No evidence of bowel wall thickening, distention, or inflammatory changes. Vascular/Lymphatic: No significant vascular findings are present. No enlarged abdominal or pelvic lymph nodes. Reproductive: Status post hysterectomy. No adnexal masses. Other: No abdominal wall hernia or abnormality. No abdominopelvic ascites. Musculoskeletal: Moderate to marked severity degenerative changes are seen at the level of L4-L5. IMPRESSION: 1.  Fatty liver. 2. Evidence of prior gastric surgery. 3. Evidence of prior cholecystectomy and prior hysterectomy. 4. Moderate to marked severity degenerative changes at the level of L4-L5. Electronically Signed   By: Aram Candela M.D.   On: 11/18/2022 01:33   CT Angio Chest PE W and/or Wo Contrast  Result Date: 11/18/2022 CLINICAL DATA:  Syncope and subsequent fall. EXAM: CT ANGIOGRAPHY CHEST WITH CONTRAST TECHNIQUE: Multidetector CT imaging of the chest was performed using the standard protocol during bolus administration of intravenous contrast. Multiplanar CT image reconstructions and MIPs were obtained to evaluate the vascular anatomy. RADIATION DOSE REDUCTION: This exam was performed according to the departmental dose-optimization program which includes automated exposure control, adjustment of the mA and/or kV according to patient size and/or use of iterative reconstruction technique. CONTRAST:  OMNIPAQUE IOHEXOL 350 MG/ML SOLN COMPARISON:  None Available. FINDINGS: Cardiovascular: A left-sided venous Port-A-Cath is in place. The thoracic aorta is normal in appearance. Satisfactory opacification of the pulmonary arteries to the segmental level. No evidence of pulmonary  embolism. Normal heart size. No pericardial effusion. Mediastinum/Nodes: No enlarged mediastinal, hilar, or axillary lymph nodes. Thyroid gland, trachea, and esophagus demonstrate no significant findings. Lungs/Pleura: A 3 mm calcified lung nodule is seen within the posterior aspect of the right lower lobe. There is no evidence of acute infiltrate, pleural effusion or pneumothorax. Upper Abdomen: Multiple surgical sutures are seen throughout the gastric region. Surgical clips are seen within the gallbladder fossa. Musculoskeletal: No chest wall abnormality. No acute or significant osseous findings. Review of the MIP images confirms the above findings. IMPRESSION: 1. No evidence of pulmonary embolism or acute cardiopulmonary disease. 2. Evidence of prior gastric surgery and prior cholecystectomy. Electronically Signed   By: Aram Candela M.D.   On: 11/18/2022 01:28   CT CERVICAL SPINE WO CONTRAST  Result Date: 11/18/2022 CLINICAL DATA:  Syncope with subsequent fall. EXAM: CT CERVICAL SPINE WITHOUT CONTRAST TECHNIQUE: Multidetector CT imaging of the cervical spine was performed without intravenous contrast. Multiplanar CT image reconstructions were also generated. RADIATION DOSE REDUCTION: This exam was performed according to the departmental dose-optimization program which includes automated exposure control, adjustment of the mA and/or kV according to patient size and/or use of iterative reconstruction technique. COMPARISON:  None Available. FINDINGS: Alignment: There is straightening of the normal cervical spine lordosis. Skull base and vertebrae: No acute fracture. No primary bone lesion or focal pathologic process. Soft tissues and spinal canal: No prevertebral fluid or swelling. No visible canal hematoma. Disc levels: Mild endplate sclerosis and mild anterior osteophyte formation are seen at the levels of C4-C5, C5-C6 and C6-C7. Mild to moderate severity intervertebral disc space narrowing is seen at  C6-C7, with mild intervertebral disc space narrowing noted throughout the remainder of the cervical spine. Mild, bilateral multilevel facet joint hypertrophy is noted. Upper chest: Negative. Other: None. IMPRESSION: 1. No acute cervical spine fracture. 2. Mild to moderate severity multilevel degenerative changes, most prominent at the levels of C4-C5, C5-C6 and C6-C7. Electronically Signed   By: Aram Candela M.D.   On: 11/18/2022 01:25   CT HEAD WO CONTRAST  Result Date: 11/18/2022 CLINICAL DATA:  Status post syncope with subsequent fall. EXAM: CT HEAD WITHOUT CONTRAST TECHNIQUE: Contiguous axial images were obtained from the base of the skull through the vertex without intravenous contrast. RADIATION DOSE REDUCTION: This exam was performed according to the departmental dose-optimization program which includes automated exposure control, adjustment of the mA and/or kV according to patient size and/or use of iterative  reconstruction technique. COMPARISON:  None Available. FINDINGS: Brain: No evidence of acute infarction, hemorrhage, hydrocephalus, extra-axial collection or mass lesion/mass effect. Vascular: No hyperdense vessel or unexpected calcification. Skull: Nondisplaced anterior nasal bone fractures are seen. Sinuses/Orbits: No acute finding. Other: Mild left paranasal, medial left periorbital and left frontal scalp soft tissue swelling is seen. IMPRESSION: 1. No acute intracranial abnormality. 2. Nondisplaced anterior nasal bone fractures. 3. Mild left paranasal, medial left periorbital and left frontal scalp soft tissue swelling. Electronically Signed   By: Aram Candela M.D.   On: 11/18/2022 01:22      Dione Booze, MD 11/18/22 [phone]

## 2022-11-18 LAB — URINALYSIS, ROUTINE W REFLEX MICROSCOPIC
Bacteria, UA: NONE SEEN
Bilirubin Urine: NEGATIVE
Glucose, UA: NEGATIVE mg/dL
Hgb urine dipstick: NEGATIVE
Ketones, ur: NEGATIVE mg/dL
Nitrite: NEGATIVE
Protein, ur: NEGATIVE mg/dL
Specific Gravity, Urine: 1.032 — ABNORMAL HIGH (ref 1.005–1.030)
pH: 8 (ref 5.0–8.0)

## 2022-11-18 LAB — TYPE AND SCREEN

## 2022-11-18 LAB — TROPONIN I (HIGH SENSITIVITY): Troponin I (High Sensitivity): 3 ng/L (ref <18)

## 2022-11-18 MED ORDER — HEPARIN SOD (PORK) LOCK FLUSH 100 UNIT/ML IV SOLN
INTRAVENOUS | Status: AC
  Administered 2022-11-18: 500 [IU]
  Filled 2022-11-18: qty 5

## 2022-11-18 NOTE — Discharge Instructions (Addendum)
Apply ice for 30 minutes at a time, 4 times a day.  You may take acetaminophen, ibuprofen, or what ever prescription pain medication you have at home as needed.  Please be aware that you can combine acetaminophen and ibuprofen, and accommodation will give you better pain relief and you get from taking either medication by itself.  Follow-up with the oral surgeon.  It is likely that you will not need anything done to your nose, but it should be evaluated once the swelling has gone down.

## 2022-11-25 DIAGNOSIS — E860 Dehydration: Secondary | ICD-10-CM | POA: Diagnosis not present

## 2022-12-04 ENCOUNTER — Encounter (HOSPITAL_BASED_OUTPATIENT_CLINIC_OR_DEPARTMENT_OTHER): Payer: Self-pay

## 2022-12-04 DIAGNOSIS — F419 Anxiety disorder, unspecified: Secondary | ICD-10-CM | POA: Diagnosis not present

## 2022-12-04 DIAGNOSIS — Z9884 Bariatric surgery status: Secondary | ICD-10-CM | POA: Diagnosis not present

## 2022-12-04 DIAGNOSIS — R4589 Other symptoms and signs involving emotional state: Secondary | ICD-10-CM | POA: Diagnosis not present

## 2022-12-04 DIAGNOSIS — F439 Reaction to severe stress, unspecified: Secondary | ICD-10-CM | POA: Diagnosis not present

## 2022-12-04 DIAGNOSIS — Z87891 Personal history of nicotine dependence: Secondary | ICD-10-CM | POA: Diagnosis not present

## 2022-12-04 DIAGNOSIS — Z171 Estrogen receptor negative status [ER-]: Secondary | ICD-10-CM | POA: Diagnosis not present

## 2022-12-04 DIAGNOSIS — E785 Hyperlipidemia, unspecified: Secondary | ICD-10-CM | POA: Diagnosis not present

## 2022-12-04 DIAGNOSIS — C50411 Malignant neoplasm of upper-outer quadrant of right female breast: Secondary | ICD-10-CM | POA: Diagnosis not present

## 2022-12-04 DIAGNOSIS — I10 Essential (primary) hypertension: Secondary | ICD-10-CM | POA: Diagnosis not present

## 2022-12-04 DIAGNOSIS — E860 Dehydration: Secondary | ICD-10-CM | POA: Diagnosis not present

## 2022-12-04 DIAGNOSIS — Z1379 Encounter for other screening for genetic and chromosomal anomalies: Secondary | ICD-10-CM | POA: Diagnosis not present

## 2022-12-04 DIAGNOSIS — Z9049 Acquired absence of other specified parts of digestive tract: Secondary | ICD-10-CM | POA: Diagnosis not present

## 2022-12-04 DIAGNOSIS — Z9013 Acquired absence of bilateral breasts and nipples: Secondary | ICD-10-CM | POA: Diagnosis not present

## 2022-12-04 DIAGNOSIS — Z5111 Encounter for antineoplastic chemotherapy: Secondary | ICD-10-CM | POA: Diagnosis not present

## 2022-12-09 ENCOUNTER — Other Ambulatory Visit: Payer: Self-pay

## 2022-12-09 DIAGNOSIS — E860 Dehydration: Secondary | ICD-10-CM | POA: Insufficient documentation

## 2022-12-16 DIAGNOSIS — Z853 Personal history of malignant neoplasm of breast: Secondary | ICD-10-CM | POA: Diagnosis not present

## 2022-12-16 DIAGNOSIS — Z09 Encounter for follow-up examination after completed treatment for conditions other than malignant neoplasm: Secondary | ICD-10-CM | POA: Diagnosis not present

## 2022-12-16 DIAGNOSIS — Z7189 Other specified counseling: Secondary | ICD-10-CM | POA: Diagnosis not present

## 2022-12-25 DIAGNOSIS — Z171 Estrogen receptor negative status [ER-]: Secondary | ICD-10-CM | POA: Diagnosis not present

## 2022-12-25 DIAGNOSIS — Z87891 Personal history of nicotine dependence: Secondary | ICD-10-CM | POA: Diagnosis not present

## 2022-12-25 DIAGNOSIS — C50411 Malignant neoplasm of upper-outer quadrant of right female breast: Secondary | ICD-10-CM | POA: Diagnosis not present

## 2022-12-25 DIAGNOSIS — E782 Mixed hyperlipidemia: Secondary | ICD-10-CM | POA: Diagnosis not present

## 2022-12-25 DIAGNOSIS — I10 Essential (primary) hypertension: Secondary | ICD-10-CM | POA: Diagnosis not present

## 2022-12-25 DIAGNOSIS — Z9049 Acquired absence of other specified parts of digestive tract: Secondary | ICD-10-CM | POA: Diagnosis not present

## 2022-12-25 DIAGNOSIS — Z9884 Bariatric surgery status: Secondary | ICD-10-CM | POA: Diagnosis not present

## 2022-12-25 DIAGNOSIS — Z923 Personal history of irradiation: Secondary | ICD-10-CM | POA: Diagnosis not present

## 2022-12-25 DIAGNOSIS — Z79899 Other long term (current) drug therapy: Secondary | ICD-10-CM | POA: Diagnosis not present

## 2022-12-25 DIAGNOSIS — E860 Dehydration: Secondary | ICD-10-CM | POA: Diagnosis not present

## 2022-12-25 DIAGNOSIS — K30 Functional dyspepsia: Secondary | ICD-10-CM | POA: Diagnosis not present

## 2022-12-25 DIAGNOSIS — Z9013 Acquired absence of bilateral breasts and nipples: Secondary | ICD-10-CM | POA: Diagnosis not present

## 2022-12-25 DIAGNOSIS — F419 Anxiety disorder, unspecified: Secondary | ICD-10-CM | POA: Diagnosis not present

## 2022-12-25 DIAGNOSIS — Z5111 Encounter for antineoplastic chemotherapy: Secondary | ICD-10-CM | POA: Diagnosis not present

## 2022-12-29 ENCOUNTER — Ambulatory Visit: Payer: BC Managed Care – PPO

## 2022-12-29 VITALS — BP 109/72 | HR 83 | Temp 98.3°F | Resp 18 | Ht 66.0 in | Wt 180.2 lb

## 2022-12-29 DIAGNOSIS — E860 Dehydration: Secondary | ICD-10-CM | POA: Diagnosis not present

## 2022-12-29 MED ORDER — HEPARIN SOD (PORK) LOCK FLUSH 100 UNIT/ML IV SOLN
500.0000 [IU] | Freq: Once | INTRAVENOUS | Status: AC | PRN
Start: 2022-12-29 — End: 2022-12-29
  Administered 2022-12-29: 500 [IU]
  Filled 2022-12-29: qty 5

## 2022-12-29 MED ORDER — SODIUM CHLORIDE 0.9 % IV BOLUS
1000.0000 mL | Freq: Once | INTRAVENOUS | Status: AC
Start: 2022-12-29 — End: 2022-12-29
  Administered 2022-12-29: 1000 mL via INTRAVENOUS
  Filled 2022-12-29: qty 1000

## 2022-12-29 NOTE — Progress Notes (Signed)
Diagnosis: Dehydration  Provider:  Chilton Greathouse MD  Procedure: IV Infusion  IV Type: Port a Cath, IV Location: L Chest  Normal Saline, Dose: 1000 ml  Infusion Start Time: 1052  Infusion Stop Time: 1157  Post Infusion IV Care: Patient declined observation and Port a Cath Deaccessed/Flushed and heparin locked  Discharge: Condition: Stable, Destination: Home . AVS Declined  Performed by:  Wyvonne Lenz, RN

## 2023-01-15 DIAGNOSIS — Z9049 Acquired absence of other specified parts of digestive tract: Secondary | ICD-10-CM | POA: Diagnosis not present

## 2023-01-15 DIAGNOSIS — C50411 Malignant neoplasm of upper-outer quadrant of right female breast: Secondary | ICD-10-CM | POA: Diagnosis not present

## 2023-01-15 DIAGNOSIS — E860 Dehydration: Secondary | ICD-10-CM | POA: Diagnosis not present

## 2023-01-15 DIAGNOSIS — Z171 Estrogen receptor negative status [ER-]: Secondary | ICD-10-CM | POA: Diagnosis not present

## 2023-01-15 DIAGNOSIS — F419 Anxiety disorder, unspecified: Secondary | ICD-10-CM | POA: Diagnosis not present

## 2023-01-15 DIAGNOSIS — Z923 Personal history of irradiation: Secondary | ICD-10-CM | POA: Diagnosis not present

## 2023-01-15 DIAGNOSIS — Z87891 Personal history of nicotine dependence: Secondary | ICD-10-CM | POA: Diagnosis not present

## 2023-01-15 DIAGNOSIS — Z1379 Encounter for other screening for genetic and chromosomal anomalies: Secondary | ICD-10-CM | POA: Diagnosis not present

## 2023-01-15 DIAGNOSIS — Z79899 Other long term (current) drug therapy: Secondary | ICD-10-CM | POA: Diagnosis not present

## 2023-01-15 DIAGNOSIS — Z5111 Encounter for antineoplastic chemotherapy: Secondary | ICD-10-CM | POA: Diagnosis not present

## 2023-01-15 DIAGNOSIS — E559 Vitamin D deficiency, unspecified: Secondary | ICD-10-CM | POA: Diagnosis not present

## 2023-01-15 DIAGNOSIS — Z9013 Acquired absence of bilateral breasts and nipples: Secondary | ICD-10-CM | POA: Diagnosis not present

## 2023-01-19 ENCOUNTER — Ambulatory Visit: Payer: BC Managed Care – PPO

## 2023-01-19 VITALS — BP 100/66 | HR 79 | Temp 98.1°F | Resp 18 | Ht 66.0 in | Wt 183.2 lb

## 2023-01-19 DIAGNOSIS — E860 Dehydration: Secondary | ICD-10-CM

## 2023-01-19 MED ORDER — SODIUM CHLORIDE 0.9 % IV BOLUS
1000.0000 mL | Freq: Once | INTRAVENOUS | Status: AC
  Administered 2023-01-19: 1000 mL via INTRAVENOUS
  Filled 2023-01-19: qty 1000

## 2023-01-19 MED ORDER — HEPARIN SOD (PORK) LOCK FLUSH 100 UNIT/ML IV SOLN
250.0000 [IU] | Freq: Once | INTRAVENOUS | Status: AC | PRN
  Administered 2023-01-19: 500 [IU]
  Filled 2023-01-19: qty 5

## 2023-01-19 NOTE — Progress Notes (Signed)
Diagnosis: Dehydration  Provider:  Chilton Greathouse MD  Procedure: IV Infusion  IV Type: Port a Cath, IV Location: L Chest  Normal Saline, Dose: 1000 ml  Infusion Start Time: 1036  Infusion Stop Time: 1150  Post Infusion IV Care: Port a Cath Deaccessed/Flushed  Discharge: Condition: Good, Destination: Home . AVS Declined  Performed by:  Adriana Mccallum, RN

## 2023-02-05 DIAGNOSIS — Z9884 Bariatric surgery status: Secondary | ICD-10-CM | POA: Diagnosis not present

## 2023-02-05 DIAGNOSIS — Z87891 Personal history of nicotine dependence: Secondary | ICD-10-CM | POA: Diagnosis not present

## 2023-02-05 DIAGNOSIS — F419 Anxiety disorder, unspecified: Secondary | ICD-10-CM | POA: Diagnosis not present

## 2023-02-05 DIAGNOSIS — Z923 Personal history of irradiation: Secondary | ICD-10-CM | POA: Diagnosis not present

## 2023-02-05 DIAGNOSIS — Z171 Estrogen receptor negative status [ER-]: Secondary | ICD-10-CM | POA: Diagnosis not present

## 2023-02-05 DIAGNOSIS — L509 Urticaria, unspecified: Secondary | ICD-10-CM | POA: Diagnosis not present

## 2023-02-05 DIAGNOSIS — I10 Essential (primary) hypertension: Secondary | ICD-10-CM | POA: Diagnosis not present

## 2023-02-05 DIAGNOSIS — Z5111 Encounter for antineoplastic chemotherapy: Secondary | ICD-10-CM | POA: Diagnosis not present

## 2023-02-05 DIAGNOSIS — Z9189 Other specified personal risk factors, not elsewhere classified: Secondary | ICD-10-CM | POA: Diagnosis not present

## 2023-02-05 DIAGNOSIS — Z23 Encounter for immunization: Secondary | ICD-10-CM | POA: Diagnosis not present

## 2023-02-05 DIAGNOSIS — C50411 Malignant neoplasm of upper-outer quadrant of right female breast: Secondary | ICD-10-CM | POA: Diagnosis not present

## 2023-02-05 DIAGNOSIS — Z9049 Acquired absence of other specified parts of digestive tract: Secondary | ICD-10-CM | POA: Diagnosis not present

## 2023-02-05 DIAGNOSIS — E860 Dehydration: Secondary | ICD-10-CM | POA: Diagnosis not present

## 2023-02-05 DIAGNOSIS — E785 Hyperlipidemia, unspecified: Secondary | ICD-10-CM | POA: Diagnosis not present

## 2023-02-05 DIAGNOSIS — Z9013 Acquired absence of bilateral breasts and nipples: Secondary | ICD-10-CM | POA: Diagnosis not present

## 2023-02-09 ENCOUNTER — Ambulatory Visit (INDEPENDENT_AMBULATORY_CARE_PROVIDER_SITE_OTHER): Payer: BC Managed Care – PPO

## 2023-02-09 VITALS — BP 100/64 | HR 78 | Temp 98.2°F | Resp 20 | Ht 66.0 in | Wt 186.8 lb

## 2023-02-09 DIAGNOSIS — E860 Dehydration: Secondary | ICD-10-CM | POA: Diagnosis not present

## 2023-02-09 MED ORDER — SODIUM CHLORIDE 0.9 % IV BOLUS
1000.0000 mL | Freq: Once | INTRAVENOUS | Status: AC
  Administered 2023-02-09: 1000 mL via INTRAVENOUS

## 2023-02-09 MED ORDER — HEPARIN SOD (PORK) LOCK FLUSH 100 UNIT/ML IV SOLN
500.0000 [IU] | Freq: Once | INTRAVENOUS | Status: AC | PRN
  Administered 2023-02-09: 500 [IU]
  Filled 2023-02-09: qty 5

## 2023-02-09 NOTE — Progress Notes (Signed)
Diagnosis: Dehydration  Provider:  Chilton Greathouse MD  Procedure: IV Infusion  IV Type: Port a Cath, IV Location: L Chest  Normal Saline, Dose: 1000 ml  Infusion Start Time: 1037  Infusion Stop Time: 1145  Post Infusion IV Care: Port a Cath Deaccessed/Flushed  Discharge: Condition: Good, Destination: Home . AVS Declined  Performed by:  Nat Math, RN

## 2023-02-23 DIAGNOSIS — Z171 Estrogen receptor negative status [ER-]: Secondary | ICD-10-CM | POA: Diagnosis not present

## 2023-02-23 DIAGNOSIS — C50911 Malignant neoplasm of unspecified site of right female breast: Secondary | ICD-10-CM | POA: Diagnosis not present

## 2023-03-12 DIAGNOSIS — E785 Hyperlipidemia, unspecified: Secondary | ICD-10-CM | POA: Diagnosis not present

## 2023-04-16 DIAGNOSIS — Z9221 Personal history of antineoplastic chemotherapy: Secondary | ICD-10-CM | POA: Diagnosis not present

## 2023-04-16 DIAGNOSIS — Z9013 Acquired absence of bilateral breasts and nipples: Secondary | ICD-10-CM | POA: Diagnosis not present

## 2023-04-16 DIAGNOSIS — I10 Essential (primary) hypertension: Secondary | ICD-10-CM | POA: Diagnosis not present

## 2023-04-16 DIAGNOSIS — E785 Hyperlipidemia, unspecified: Secondary | ICD-10-CM | POA: Diagnosis not present

## 2023-04-16 DIAGNOSIS — Z1732 Human epidermal growth factor receptor 2 negative status: Secondary | ICD-10-CM | POA: Diagnosis not present

## 2023-04-16 DIAGNOSIS — D709 Neutropenia, unspecified: Secondary | ICD-10-CM | POA: Diagnosis not present

## 2023-04-16 DIAGNOSIS — F418 Other specified anxiety disorders: Secondary | ICD-10-CM | POA: Diagnosis not present

## 2023-04-16 DIAGNOSIS — D701 Agranulocytosis secondary to cancer chemotherapy: Secondary | ICD-10-CM | POA: Diagnosis not present

## 2023-04-16 DIAGNOSIS — Z171 Estrogen receptor negative status [ER-]: Secondary | ICD-10-CM | POA: Diagnosis not present

## 2023-04-16 DIAGNOSIS — Z853 Personal history of malignant neoplasm of breast: Secondary | ICD-10-CM | POA: Diagnosis not present

## 2023-04-16 DIAGNOSIS — Z1379 Encounter for other screening for genetic and chromosomal anomalies: Secondary | ICD-10-CM | POA: Diagnosis not present

## 2023-04-16 DIAGNOSIS — Z7722 Contact with and (suspected) exposure to environmental tobacco smoke (acute) (chronic): Secondary | ICD-10-CM | POA: Diagnosis not present

## 2023-04-16 DIAGNOSIS — C50411 Malignant neoplasm of upper-outer quadrant of right female breast: Secondary | ICD-10-CM | POA: Diagnosis not present

## 2023-04-16 DIAGNOSIS — Z87891 Personal history of nicotine dependence: Secondary | ICD-10-CM | POA: Diagnosis not present

## 2023-04-27 ENCOUNTER — Institutional Professional Consult (permissible substitution) (INDEPENDENT_AMBULATORY_CARE_PROVIDER_SITE_OTHER): Payer: BC Managed Care – PPO

## 2023-04-30 DIAGNOSIS — Z9013 Acquired absence of bilateral breasts and nipples: Secondary | ICD-10-CM | POA: Diagnosis not present

## 2023-04-30 DIAGNOSIS — Z853 Personal history of malignant neoplasm of breast: Secondary | ICD-10-CM | POA: Diagnosis not present

## 2023-05-12 DIAGNOSIS — M8589 Other specified disorders of bone density and structure, multiple sites: Secondary | ICD-10-CM | POA: Diagnosis not present

## 2023-05-12 DIAGNOSIS — E785 Hyperlipidemia, unspecified: Secondary | ICD-10-CM | POA: Diagnosis not present

## 2023-05-19 DIAGNOSIS — Z1331 Encounter for screening for depression: Secondary | ICD-10-CM | POA: Diagnosis not present

## 2023-05-19 DIAGNOSIS — Z23 Encounter for immunization: Secondary | ICD-10-CM | POA: Diagnosis not present

## 2023-05-19 DIAGNOSIS — Z1339 Encounter for screening examination for other mental health and behavioral disorders: Secondary | ICD-10-CM | POA: Diagnosis not present

## 2023-05-19 DIAGNOSIS — Z0000 Encounter for general adult medical examination without abnormal findings: Secondary | ICD-10-CM | POA: Diagnosis not present

## 2023-05-19 DIAGNOSIS — M8589 Other specified disorders of bone density and structure, multiple sites: Secondary | ICD-10-CM | POA: Diagnosis not present

## 2023-06-08 ENCOUNTER — Encounter (HOSPITAL_BASED_OUTPATIENT_CLINIC_OR_DEPARTMENT_OTHER): Payer: Self-pay | Admitting: Cardiology

## 2023-06-08 ENCOUNTER — Ambulatory Visit (HOSPITAL_BASED_OUTPATIENT_CLINIC_OR_DEPARTMENT_OTHER): Payer: BC Managed Care – PPO | Admitting: Cardiology

## 2023-06-08 VITALS — BP 104/68 | HR 83 | Ht 67.0 in | Wt 181.9 lb

## 2023-06-08 DIAGNOSIS — E7800 Pure hypercholesterolemia, unspecified: Secondary | ICD-10-CM | POA: Diagnosis not present

## 2023-06-08 DIAGNOSIS — Z8679 Personal history of other diseases of the circulatory system: Secondary | ICD-10-CM

## 2023-06-08 DIAGNOSIS — Z9884 Bariatric surgery status: Secondary | ICD-10-CM

## 2023-06-08 DIAGNOSIS — Z853 Personal history of malignant neoplasm of breast: Secondary | ICD-10-CM

## 2023-06-08 DIAGNOSIS — Z7189 Other specified counseling: Secondary | ICD-10-CM

## 2023-06-08 NOTE — Progress Notes (Signed)
 Cardiology Office Note:  .   Date:  06/08/2023  ID:  Alexis Chase, DOB 10/06/1970, MRN [id number] PCP: Azalia Leo, MD   HeartCare Providers Cardiologist:  Sheryle Donning, MD {  History of Present Illness: .   Alexis Chase is a 53 y.o. female with a hx of breast cancer s/p recurrence 2024 now s/p bilateral mastectomy and chemotherapy, obesity s/p sleeve gastrectomy 2019 and glp, hypertension who is seen for follow up today. She has a past medical history of chemotherapy with adriamycin, HTN, and HLD, as well as a remote history of tobacco use, as risk factors.    Breast cancer history: in 2015, L breast cancer treated with neoadjuvant chemotherapy followed by lumpectomy and radiation, then chemotherapy. Had followed up in [place], [place] with oncology.  Had recurrence 2024 by MRI, now s/p bilateral mastectomy 08/2022 and chemotherapy completed 01/2024 (docetaxel/cyclophosphamide), followed at [facility].  Today: Reviewed the recent course with recurrence of her breast cancer, as above. Discussed her concerns re: monitoring and risk of recurrence, which she is working with her oncology team on.  Reviewed her CV risk/symptoms. Risk is well controlled. Reviewed recent labs per [facility] 3.18.25: Tchol 157, HDL 59, LDL 82, TG 79.  ROS: Denies chest pain, shortness of breath at rest or with normal exertion. No PND, orthopnea, LE edema or unexpected weight gain. No syncope or palpitations. ROS otherwise negative except as noted.   Studies Reviewed: Aaron Aas    EKG:       Physical Exam:   VS:  BP 104/68   Pulse 83   Ht 5\' 7"  (1.702 m)   Wt 181 lb 14.4 oz (82.5 kg)   SpO2 96%   BMI 28.49 kg/m    Wt Readings from Last 3 Encounters:  06/08/23 181 lb 14.4 oz (82.5 kg)  02/09/23 186 lb 12.8 oz (84.7 kg)  01/19/23 183 lb 3.2 oz (83.1 kg)    GEN: Well nourished, well developed in no acute distress HEENT: Normal, moist mucous membranes NECK: No JVD CARDIAC: regular rhythm, normal S1 and S2, no  rubs or gallops. No murmur. VASCULAR: Radial and DP pulses 2+ bilaterally. No carotid bruits RESPIRATORY:  Clear to auscultation without rales, wheezing or rhonchi  ABDOMEN: Soft, non-tender, non-distended MUSCULOSKELETAL:  Ambulates independently SKIN: Warm and dry, no edema NEUROLOGIC:  Alert and oriented x 3. No focal neuro deficits noted. PSYCHIATRIC:  Normal affect    ASSESSMENT AND PLAN: .    Obesity and metabolic syndrome, now s/p sleeve gastrectomy 01/2018:  -doing very well with this -BMI 28 -on tirzepatide   Hypertension, history of: well controlled today.  -off all antihypertensives   Hyperlipidemia, history of type II diabetes:  -she is well educated on the literature -continue atorvastatin given diabetes. On GLP1RA as well -aspirin was on hold while on chemotherapy treatment (cytopenia). Discussed today, will not restart per patient preference.   History of breast cancer, with radiation and adriamycin, now with remission in 2024 s/p bilateral mastectomy and chemotherapy (docetaxel and cyclophosphamide) -doing well, echo with normal strain measurements 09/2017  CV risk counseling and prevention -recommend heart healthy/Mediterranean diet, with whole grains, fruits, vegetable, fish, lean meats, nuts, and olive oil. Limit salt. -recommend moderate walking, 3-5 times/week for 30-50 minutes each session. Aim for at least 150 minutes.week. Goal should be pace of 3 miles/hours, or walking 1.5 miles in 30 minutes -recommend avoidance of tobacco products. Avoid excess alcohol.  Dispo: 2 years or sooner as needed  Signed, Yasmeen Manka  Veryl Gottron, MD   Sheryle Donning, MD, PhD, [facility] [place]  [facility]  [place]  Heart & Vascular at [facility] at [facility] [address], [address] [place], [place] [address] [phone]

## 2023-06-08 NOTE — Patient Instructions (Signed)
 Medication Instructions:  No Changes *If you need a refill on your cardiac medications before your next appointment, please call your pharmacy*  Lab Work: No Labs If you have labs (blood work) drawn today and your tests are completely normal, you will receive your results only by: MyChart Message (if you have MyChart) OR A paper copy in the mail If you have any lab test that is abnormal or we need to change your treatment, we will call you to review the results.  Testing/Procedures: No Testing  Follow-Up: At [facility], you and your health needs are our priority.  As part of our continuing mission to provide you with exceptional heart care, our providers are all part of one team.  This team includes your primary Cardiologist (physician) and Advanced Practice Providers or APPs (Physician Assistants and Nurse Practitioners) who all work together to provide you with the care you need, when you need it.  Your next appointment:   2 year(s)  Provider:   Sheryle Donning, MD    We recommend signing up for the patient portal called "MyChart".  Sign up information is provided on this After Visit Summary.  MyChart is used to connect with patients for Virtual Visits (Telemedicine).  Patients are able to view lab/test results, encounter notes, upcoming appointments, etc.  Non-urgent messages can be sent to your provider as well.   To learn more about what you can do with MyChart, go to [email or web address].

## 2023-07-07 ENCOUNTER — Institutional Professional Consult (permissible substitution) (INDEPENDENT_AMBULATORY_CARE_PROVIDER_SITE_OTHER): Payer: BC Managed Care – PPO | Admitting: Otolaryngology

## 2023-08-27 ENCOUNTER — Encounter (HOSPITAL_COMMUNITY): Payer: Self-pay | Admitting: *Deleted

## 2023-09-15 DIAGNOSIS — E8881 Metabolic syndrome: Secondary | ICD-10-CM | POA: Diagnosis not present

## 2023-09-28 DIAGNOSIS — L821 Other seborrheic keratosis: Secondary | ICD-10-CM | POA: Diagnosis not present

## 2023-09-28 DIAGNOSIS — L814 Other melanin hyperpigmentation: Secondary | ICD-10-CM | POA: Diagnosis not present

## 2023-09-28 DIAGNOSIS — D229 Melanocytic nevi, unspecified: Secondary | ICD-10-CM | POA: Diagnosis not present

## 2023-09-28 DIAGNOSIS — L578 Other skin changes due to chronic exposure to nonionizing radiation: Secondary | ICD-10-CM | POA: Diagnosis not present

## 2023-11-04 ENCOUNTER — Ambulatory Visit (INDEPENDENT_AMBULATORY_CARE_PROVIDER_SITE_OTHER)

## 2023-11-04 VITALS — BP 116/80 | HR 69 | Temp 97.4°F | Resp 18 | Ht 66.5 in | Wt 171.0 lb

## 2023-11-04 DIAGNOSIS — Z452 Encounter for adjustment and management of vascular access device: Secondary | ICD-10-CM | POA: Diagnosis not present

## 2023-11-04 DIAGNOSIS — E860 Dehydration: Secondary | ICD-10-CM

## 2023-11-04 MED ORDER — SODIUM CHLORIDE 0.9% FLUSH: 3.0000 mL | Freq: Once | INTRAVENOUS | Status: DC | PRN

## 2023-11-04 MED ORDER — HEPARIN SOD (PORK) LOCK FLUSH 100 UNIT/ML IV SOLN
500.0000 [IU] | Freq: Once | INTRAVENOUS | Status: AC | PRN
  Administered 2023-11-04: 500 [IU]
  Filled 2023-11-04: qty 5

## 2023-11-04 NOTE — Progress Notes (Signed)
 Diagnosis: Port flush  Provider:  Lonna Coder, MD  Procedure:Port flushed,blood returned noted and heparin  locked.   Discharge: Condition: Good, Destination: Home . AVS provided to patient.   Performed by:  Chayson Charters, RN

## 2024-01-05 DIAGNOSIS — Z9013 Acquired absence of bilateral breasts and nipples: Secondary | ICD-10-CM | POA: Diagnosis not present

## 2024-01-05 DIAGNOSIS — M25612 Stiffness of left shoulder, not elsewhere classified: Secondary | ICD-10-CM | POA: Diagnosis not present

## 2024-01-05 DIAGNOSIS — Z853 Personal history of malignant neoplasm of breast: Secondary | ICD-10-CM | POA: Diagnosis not present

## 2024-01-18 NOTE — Therapy (Signed)
 OUTPATIENT PHYSICAL THERAPY  UPPER EXTREMITY ONCOLOGY EVALUATION  Patient Name: Alexis Chase MRN: [id number] DOB:28-Jun-1970, 53 y.o., female Today's Date: 01/19/2024  END OF SESSION:  PT End of Session - 01/19/24 0903     Visit Number 1    Number of Visits 12    Date for Recertification  03/01/24    PT Start Time 0801    PT Stop Time 0900    PT Time Calculation (min) 59 min    Activity Tolerance Patient tolerated treatment well    Behavior During Therapy [facility] for tasks assessed/performed          Past Medical History:  Diagnosis Date   Anemia    Cancer (HCC) 2015   breast cancer    Closed displaced fracture of acromial end of right clavicle 03/09/2015   Decreased bone mass 08/20/2018   Dysthymia, Rx Lexapro  02/07/2017   Essential hypertension, on Norvasc  02/07/2017   History of breast cancer, Dx 2015, ER +, HER2 -, grade 3, s/p adriamycin and radiation 02/07/2017   Hyperlipidemia, on statin 02/07/2017   Metabolic syndrome 01/25/2018   PCOS (polycystic ovarian syndrome)    Personal history of chemo and radiation therapy for breast cancer    Pure hypercholesterolemia 02/07/2017   S/P laparoscopic sleeve gastrectomy, 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 Tanda 03/29/2018   SUI (stress urinary incontinence, female) 01/25/2018   Use of letrozole  (Femara ) 08/20/2018   Vitamin D  deficiency 02/08/2017   Past Surgical History:  Procedure Laterality Date   CESAREAN SECTION     CHOLECYSTECTOMY     HERNIA REPAIR     LAPAROSCOPIC GASTRIC SLEEVE RESECTION WITH HIATAL HERNIA REPAIR N/A 01/25/2018   Procedure: LAPAROSCOPIC GASTRIC SLEEVE RESECTION WITH HIATAL HERNIA REPAIR, UPPER ENDO AND ERAS PATHWAY;  Surgeon: Tanda Locus, MD;  Location: [facility] ORS;  Service: General;  Laterality: N/A;   PORTACATH PLACEMENT     TONSILLECTOMY     TOTAL ABDOMINAL HYSTERECTOMY W/ BILATERAL SALPINGOOPHORECTOMY     Patient Active Problem List   Diagnosis Date Noted   Dehydration 12/09/2022   Obesity  (BMI 30-39.9) 05/06/2019   Hirsutism 05/06/2019   Osteopenia 11/01/2018   Use of letrozole  (Femara ) 08/20/2018   S/P laparoscopic sleeve gastrectomy, 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 Tanda 03/29/2018   SUI (stress urinary incontinence, female) 01/25/2018   Vitamin D  deficiency 02/08/2017   Mixed hyperlipidemia 02/07/2017   Dysthymia 02/07/2017   History of breast cancer, Dx 2015, ER +, HER2 -, grade 3, s/p adriamycin and radiation 02/07/2017    PCP:   REFERRING PROVIDER: Jaclyn Marie White, NP  REFERRING DIAG: s/p Bilateral Mastectomies   THERAPY DIAG:  Personal history of malignant neoplasm of breast  History of bilateral mastectomy  Stiffness of left shoulder, not elsewhere classified  Axillary web syndrome  ONSET DATE: 09/22/2022  Rationale for Evaluation and Treatment: Rehabilitation  SUBJECTIVE:  SUBJECTIVE STATEMENT: I have been lifting wts and the last month or 2 went to a higher wt. I feel very tight in axilla.  When I saw the MD she thought I had some cording. In September I was on a trip and had 2 lengthy flights. Didn't notice any changes after traveling but my arm feels heavy. It hurts in the armpit when I raise my arm.   PERTINENT HISTORY:  Pt is s/p left lumpectomy with SLNB( 1 or 2) 03/01/2014. She had neoadjuvant chemotherapy at that time and radiation and was on Letrozole  for 5 years  She was diagnosed with Right  triple negative breast cancer in June 2024 and is s/p bilateral Mastectomies with Right SLNB and 0+/3 sentinel LN's. She underwent adjuvant chemotherapy and is scheduled for a bone scan in Dec. 2025. She is back on letrozole .    PAIN:  Are you having pain? No NPRS scale: 0/10 - 4-5/10 Pain location: left axillary region Pain orientation: Left  PAIN TYPE:  throbbing and tight Pain description: intermittent  Aggravating factors: raising arm, barre, sometimes at work carrying samples Relieving factors: not reaching.  PRECAUTIONS: bilateral UE lymphedema risk, Right Clavicle ORIF, hypertension, gastric sleeve, osteopenia  RED FLAGS: None   WEIGHT BEARING RESTRICTIONS: No  FALLS:  Has patient fallen in last 6 months? No  LIVING ENVIRONMENT: Lives with: lives with their family, husband and 51 yr old twin sons Lives in: House/apartment    OCCUPATION: Manufacturing Systems Engineer for [employer]    LEISURE: decorate, travel, barre, spend time with family and friends  HAND DOMINANCE: left   PRIOR LEVEL OF FUNCTION: Independent  PATIENT GOALS: Decrease pain, improve ROM   OBJECTIVE: Note: Objective measures were completed at Evaluation unless otherwise noted.  COGNITION: Overall cognitive status: Within functional limits for tasks assessed   PALPATION: Multiple thin cords noted in axilla with some travelling into forearm, Tight left pectorals  OBSERVATIONS / OTHER ASSESSMENTS: no pitting edema or observable/measureable signs of lymphedema  SENSATION: Light touch: Deficits     POSTURE: forward head, rounded shoulders  UPPER EXTREMITY AROM/PROM:  A/PROM RIGHT   eval   Shoulder extension 54  Shoulder flexion 152  Shoulder abduction 180  Shoulder internal rotation 77  Shoulder external rotation 95    (Blank rows = not tested)  A/PROM LEFT   eval  Shoulder extension 55  Shoulder flexion 142  Shoulder abduction 154  Shoulder internal rotation 54  Shoulder external rotation 88    (Blank rows = not tested)  CERVICAL AROM: All within normal limits:     UPPER EXTREMITY STRENGTH:   LYMPHEDEMA ASSESSMENTS:   SURGERY TYPE/DATE: left lumpectomy with SLNB 03/01/2014. s/p bilateral Mastectomies with Right SLNB and 0+/3 LN's  NUMBER OF LYMPH NODES REMOVED: 0+/3 Left  CHEMOTHERAPY: yes neoadjuvant 2015, Adjuvant  2024  RADIATION:Yes 2016  HORMONE TREATMENT: Letrozole  x 5 years    LYMPHEDEMA ASSESSMENTS:   LANDMARK RIGHT  eval  At axilla  34.2  15 cm proximal to the proximal aspect of the olecranon process 32.2  10 cm proximal to the proximal aspect of the olecranon process 29  Olecranon process 26.5  15 cm proximal to the proximal aspect of the ulnar styloid process 24.6  10 cm proximal to the proximal aspect of the ulnar styloid process 22.0  Just distal to the ulnar styloid process 16.3  Across hand at thumb web space 19.5  At base of 2nd digit 6.4  (Blank rows = not tested)  [facility]  LEFT  eval  At axilla  33.9  15 cm proximal to the proximal aspect of the olecranon process 33.0  10 cm proximal to the proximal aspect of the olecranon process 29.9  Olecranon process 27.0  15 cm proximal to the proximal aspect of the ulnar styloid process 24.2  10 cm proximal to  the proximal aspect of the ulnar styloid process 21.9  Just distal to the ulnar styloid process 16.1  Across hand at thumb web space 19.2  At base of 2nd digit 6.6  (Blank rows = not tested)  Chest circumference just inferior to the axillae:  Chest circumference at the largest point:       QUICK DASH SURVEY: 25%                                                                                                                            TREATMENT DATE:  01/19/2024 Educated pt in axillary cording. Demonstrated release techniques on pt and showed pt gentle stretches to release cords. Supine wand scaption x 5, stargazer supine, wall slide for abd  being careful not to let UT compensate. Exercises to be done 5 reps 2 times per day. Also showed wrist ext stretches in varying degrees of shoulder extension/scaption to gentle stretch cords throughout the day. Advised OK to do legs/ abs at barre class, but avoid left UE. Pt has a compression sleeve. Advised OK to wear for cording , but use caution that hand is not swelling.  Advised to bring in sleeve so we can see.     PATIENT EDUCATION:  Education details: Educated pt in axillary cording. Demonstrated release techniques on pt and showed pt gentle stretches to release cords. Supine wand scaption x 5, stargazer supine, wall slide for abd  abeing careful not to let UT compensate. Exercises to be done 5 reps 2 times per day. Also showed wrist ext stretches in varying degrees of shoulder extension/scaption to gentle stretch cords throughout the day. Advised OK to do legs/ abs at barre class, but avoid left UE. Pt has a compression sleeve. Advised OK to wear for cording , but use caution that hand is not swelling. Advised to bring in sleeve so we can see.  Person educated: Patient Education method: Explanation, Demonstration, and Handouts Education comprehension: verbalized understanding and returned demonstration  HOME EXERCISE PROGRAM: Supine wand scaption, supine stargazer, standing wall slide abduction, wrist ext in various positions of shoulder to stretch cords  ASSESSMENT:  CLINICAL IMPRESSION: Patient is a 53 y.o. female who was seen today for physical therapy evaluation and treatment s/p bilateral Mastectomies with adjacent tissue transfer to close on 09/22/2022 . She was seen for follow up by MD with  noted decrease in left shoulder ROM and cording. She does demonstrate limitations in left shoulder ROM and multiple cords are palpated in the left axilla and running into the forearm. There are no concerns for lymphedema today. She was instructed in some gentle stretches  to address cording, and my wear her compression sleeve as comfortable to assist with cording. She will benefit from skilled PT to address deficits and return to PLOF.   OBJECTIVE IMPAIRMENTS: decreased knowledge of condition, decreased ROM, impaired flexibility, impaired UE functional use, postural dysfunction, and pain.   ACTIVITY LIMITATIONS: reach over head  PARTICIPATION LIMITATIONS: able  to take part in all but full Barre class, restricted with activities that include reaching  PERSONAL FACTORS: None are also affecting patient's functional outcome.   REHAB POTENTIAL: Excellent  CLINICAL DECISION MAKING: Stable/uncomplicated  EVALUATION COMPLEXITY: Low  GOALS: Goals reviewed with patient? Yes  SHORT TERM GOALS=LONG TERM GOALS: Target date: 03/01/2024  Pt will be independent with a HEP to improve shoulder ROM/strength Baseline: Goal status: INITIAL  2.  Pt will have left shoulder AROM flexion and abd within 5-8 degrees of right shoulder Baseline: Right 152, 180 Goal status: INITIAL  3.  Pts quick dash will improve to no greater than 12% to demonstrate improved function Baseline:  Goal status: INITIAL  4.  Pt will be able to resume left UE strengthening without aggravation of cording Baseline:  Goal status: INITIAL   PLAN:  PT FREQUENCY: 1-2x/week  PT DURATION: 6 weeks  PLANNED INTERVENTIONS: 97164- PT Re-evaluation, 97110-Therapeutic exercises, 97530- Therapeutic activity, [id number]- Neuromuscular re-education, 97535- Self Care, [id number]- Manual therapy, 97760- Orthotic Initial, [id number]- Orthotic/Prosthetic subsequent, and Joint mobilization  PLAN FOR NEXT SESSION: review HEP and progress, pulleys, cording release, cupping prn, MLD prn,  Grayce JINNY Sheldon, PT 01/19/2024, 9:04 AM

## 2024-01-19 ENCOUNTER — Ambulatory Visit: Attending: Nurse Practitioner

## 2024-01-19 ENCOUNTER — Other Ambulatory Visit: Payer: Self-pay

## 2024-01-19 DIAGNOSIS — L905 Scar conditions and fibrosis of skin: Secondary | ICD-10-CM | POA: Diagnosis not present

## 2024-01-19 DIAGNOSIS — Z9013 Acquired absence of bilateral breasts and nipples: Secondary | ICD-10-CM | POA: Insufficient documentation

## 2024-01-19 DIAGNOSIS — Z853 Personal history of malignant neoplasm of breast: Secondary | ICD-10-CM | POA: Diagnosis not present

## 2024-01-19 DIAGNOSIS — L7682 Other postprocedural complications of skin and subcutaneous tissue: Secondary | ICD-10-CM | POA: Diagnosis not present

## 2024-01-19 DIAGNOSIS — M25612 Stiffness of left shoulder, not elsewhere classified: Secondary | ICD-10-CM | POA: Insufficient documentation

## 2024-01-19 NOTE — Patient Instructions (Signed)
 Axillary web syndrome (also called cording) can happen after having breast cancer surgery when lymph nodes in the armpit are removed. It presents as if you have a thin cord in your arm and can run from the armpit all the way down into the forearm. If you've had a sentinel node biopsy, the risk is 1-20% and if you've had an axillary lymph node dissection (more than 7 nodes removed), the risk is 36-72%. The ranges vary depending on the research study.  It most often happens 3-4 weeks post-op but can happen sooner or later. There are several possibilities for what cording actually is. Although no one knows for sure as of yet, it may be related to lymphatics, veins, or other tissue. Sometimes cording resolves on its own but other times it requires physical therapy with a therapist who specializes in lymphedema and/or cancer rehab. Treatment typically involves stretching, manual techniques, and exercise. Sometimes cords get "released" while stretching or during manual treatment and the patient may experience the sensation of a "pop." This may feel strange but it is not dangerous and is a sign that the cord has released; range of motion may be improved in the process.   SHOULDER: Flexion - Supine (Cane)        Cancer Rehab [phone]    Hold cane in both hands. Raise arms up overhead. Do not allow back to arch. Hold _5__ seconds. Do __5__ times; __2__ times a day.   hands in V position   Shoulder Blade Stretch    Clasp fingers behind head with elbows touching in front of face. Pull elbows back while pressing shoulder blades together. Relax and hold as tolerated, can place pillow under elbow here for comfort as needed and to allow for prolonged stretch.  Repeat __5__ times. Do __1-2__ sessions per day.     Copyright  VHI. All rights reserved.

## 2024-01-27 ENCOUNTER — Other Ambulatory Visit: Payer: Self-pay

## 2024-01-27 ENCOUNTER — Other Ambulatory Visit (HOSPITAL_COMMUNITY): Payer: Self-pay

## 2024-01-27 MED ORDER — LORAZEPAM 1 MG PO TABS
1.0000 mg | ORAL_TABLET | Freq: Three times a day (TID) | ORAL | 0 refills | Status: AC | PRN
  Filled 2024-01-27: qty 30, 10d supply, fill #0

## 2024-01-28 ENCOUNTER — Other Ambulatory Visit: Payer: Self-pay

## 2024-02-02 ENCOUNTER — Other Ambulatory Visit: Payer: Self-pay

## 2024-02-12 ENCOUNTER — Other Ambulatory Visit: Payer: Self-pay

## 2024-02-15 DIAGNOSIS — M8589 Other specified disorders of bone density and structure, multiple sites: Secondary | ICD-10-CM | POA: Diagnosis not present

## 2024-02-24 ENCOUNTER — Ambulatory Visit: Attending: Nurse Practitioner

## 2024-02-24 DIAGNOSIS — M25612 Stiffness of left shoulder, not elsewhere classified: Secondary | ICD-10-CM | POA: Insufficient documentation

## 2024-02-24 DIAGNOSIS — Z853 Personal history of malignant neoplasm of breast: Secondary | ICD-10-CM | POA: Diagnosis not present

## 2024-02-24 DIAGNOSIS — L905 Scar conditions and fibrosis of skin: Secondary | ICD-10-CM | POA: Diagnosis not present

## 2024-02-24 DIAGNOSIS — L7682 Other postprocedural complications of skin and subcutaneous tissue: Secondary | ICD-10-CM | POA: Insufficient documentation

## 2024-02-24 DIAGNOSIS — Z9013 Acquired absence of bilateral breasts and nipples: Secondary | ICD-10-CM | POA: Diagnosis not present

## 2024-02-24 NOTE — Therapy (Signed)
 " OUTPATIENT PHYSICAL THERAPY  UPPER EXTREMITY ONCOLOGY TREATMENT  Patient Name: Alexis Chase MRN: [id number] DOB:21-Nov-1970, 53 y.o., female Today's Date: 02/24/2024  END OF SESSION:  PT End of Session - 02/24/24 0807     Visit Number 2    Number of Visits 12    Date for Recertification  03/01/24    PT Start Time 0807    PT Stop Time 0912    PT Time Calculation (min) 65 min    Activity Tolerance Patient tolerated treatment well    Behavior During Therapy [facility] for tasks assessed/performed          Past Medical History:  Diagnosis Date   Anemia    Cancer (HCC) 2015   breast cancer    Closed displaced fracture of acromial end of right clavicle 03/09/2015   Decreased bone mass 08/20/2018   Dysthymia, Rx Lexapro  02/07/2017   Essential hypertension, on Norvasc  02/07/2017   History of breast cancer, Dx 2015, ER +, HER2 -, grade 3, s/p adriamycin and radiation 02/07/2017   Hyperlipidemia, on statin 02/07/2017   Metabolic syndrome 01/25/2018   PCOS (polycystic ovarian syndrome)    Personal history of chemo and radiation therapy for breast cancer    Pure hypercholesterolemia 02/07/2017   S/P laparoscopic sleeve gastrectomy, 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 Tanda 03/29/2018   SUI (stress urinary incontinence, female) 01/25/2018   Use of letrozole  (Femara ) 08/20/2018   Vitamin D  deficiency 02/08/2017   Past Surgical History:  Procedure Laterality Date   CESAREAN SECTION     CHOLECYSTECTOMY     HERNIA REPAIR     LAPAROSCOPIC GASTRIC SLEEVE RESECTION WITH HIATAL HERNIA REPAIR N/A 01/25/2018   Procedure: LAPAROSCOPIC GASTRIC SLEEVE RESECTION WITH HIATAL HERNIA REPAIR, UPPER ENDO AND ERAS PATHWAY;  Surgeon: Tanda Locus, MD;  Location: [facility];  Service: General;  Laterality: N/A;   PORTACATH PLACEMENT     TONSILLECTOMY     TOTAL ABDOMINAL HYSTERECTOMY W/ BILATERAL SALPINGOOPHORECTOMY     Patient Active Problem List   Diagnosis Date Noted   Dehydration 12/09/2022   Obesity (BMI  30-39.9) 05/06/2019   Hirsutism 05/06/2019   Osteopenia 11/01/2018   Use of letrozole  (Femara ) 08/20/2018   S/P laparoscopic sleeve gastrectomy, 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 Tanda 03/29/2018   SUI (stress urinary incontinence, female) 01/25/2018   Vitamin D  deficiency 02/08/2017   Mixed hyperlipidemia 02/07/2017   Dysthymia 02/07/2017   History of breast cancer, Dx 2015, ER +, HER2 -, grade 3, s/p adriamycin and radiation 02/07/2017    PCP:   REFERRING PROVIDER: Jaclyn Marie White, NP  REFERRING DIAG: s/p Bilateral Mastectomies   THERAPY DIAG:  Personal history of malignant neoplasm of breast  History of bilateral mastectomy  Stiffness of left shoulder, not elsewhere classified  Axillary web syndrome  ONSET DATE: 09/22/2022  Rationale for Evaluation and Treatment: Rehabilitation  SUBJECTIVE:  SUBJECTIVE STATEMENT:  I feel so stiff because I have been laying in bed with sickness. I have tightness in the axilla and top of my shoulder  PERTINENT HISTORY:  Pt is s/p left lumpectomy with SLNB( 1 or 2) 03/01/2014. She had neoadjuvant chemotherapy at that time and radiation and was on Letrozole  for 5 years  She was diagnosed with Right  triple negative breast cancer in June 2024 and is s/p bilateral Mastectomies with Right SLNB and 0+/3 sentinel LN's. She underwent adjuvant chemotherapy and is scheduled for a bone scan in Dec. 2025. She is back on letrozole .    PAIN:  Are you having pain? No NPRS scale: 3-4/10 Pain location: left axillary region Pain orientation: Left  PAIN TYPE: throbbing and tight Pain description: intermittent  Aggravating factors: raising arm, barre, sometimes at work carrying samples Relieving factors: not reaching.  PRECAUTIONS: bilateral UE lymphedema risk,  Right Clavicle ORIF, hypertension, gastric sleeve, osteopenia  RED FLAGS: None   WEIGHT BEARING RESTRICTIONS: No  FALLS:  Has patient fallen in last 6 months? 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  COGNITION: Overall cognitive status: Within functional limits for tasks assessed   PALPATION: Multiple thin cords noted in axilla with some travelling into forearm, Tight left pectorals  OBSERVATIONS / OTHER ASSESSMENTS: no pitting edema or observable/measureable signs of lymphedema  SENSATION: Light touch: Deficits     POSTURE: forward head, rounded shoulders  UPPER EXTREMITY AROM/PROM:  A/PROM RIGHT   eval   Shoulder extension 54  Shoulder flexion 152  Shoulder abduction 180  Shoulder internal rotation 77  Shoulder external rotation 95    (Blank rows = not tested)  A/PROM LEFT   eval  Shoulder extension 55  Shoulder flexion 142  Shoulder abduction 154  Shoulder internal rotation 54  Shoulder external rotation 88    (Blank rows = not tested)  CERVICAL AROM: All within normal limits:     UPPER EXTREMITY STRENGTH:   LYMPHEDEMA ASSESSMENTS:   SURGERY TYPE/DATE: left lumpectomy with SLNB 03/01/2014. s/p bilateral Mastectomies with Right SLNB and 0+/3 LN's  NUMBER OF LYMPH NODES REMOVED: 0+/3 Left  CHEMOTHERAPY: yes neoadjuvant 2015, Adjuvant 2024  RADIATION:Yes 2016  HORMONE TREATMENT: Letrozole  x 5 years    LYMPHEDEMA ASSESSMENTS:   LANDMARK RIGHT  eval  At axilla  34.2  15 cm proximal to the proximal aspect of the olecranon process 32.2  10 cm  proximal to the proximal aspect of the olecranon process 29  Olecranon process 26.5  15 cm proximal to the proximal aspect of the ulnar styloid process 24.6  10 cm proximal to the proximal aspect of the ulnar styloid process 22.0  Just distal to the ulnar styloid process 16.3  Across hand at thumb web space 19.5  At base of 2nd digit 6.4  (Blank rows = not tested)  LANDMARK LEFT  eval  At axilla  33.9  15 cm proximal to the proximal aspect of the olecranon process 33.0  10 cm proximal to the proximal aspect of the olecranon process 29.9  Olecranon process 27.0  15 cm proximal to the proximal  aspect of the ulnar styloid process 24.2  10 cm proximal to  the proximal aspect of the ulnar styloid process 21.9  Just distal to the ulnar styloid process 16.1  Across hand at thumb web space 19.2  At base of 2nd digit 6.6  (Blank rows = not tested)  Chest circumference just inferior to the axillae:  Chest circumference at the largest point:       QUICK DASH SURVEY: 25%                                                                                                                            TREATMENT DATE:   02/24/2024 Tried overhead pulleys but pt didn't relax well and got very tired after 1 min so discontinued. Discussed scapular compensation and importance of not letting shoulders ride up which produces shoulder pain. STM with cocoa butter to bilateral UT/posterior cervicals, Left pectorals, lateral trunki n supineand left scapular region in Right SL. Supine wand flex and scaption with multiple VC's to relax Star gazer x 5 Lower trunk rotation with arms outstretched x 3 ea Pectoral corner stretch and single arm doorway stretch x 2-3 ea Showed pt posterior shoulder rolls and scapular retraction for relaxation/posture PROM Left shoulder Flexion, scaption, abduction, ER with multiple VC's to relax Updated HEP 01/19/2024 Educated pt in axillary cording. Demonstrated release  techniques on pt and showed pt gentle stretches to release cords. Supine wand scaption x 5, stargazer supine, wall slide for abd  being careful not to let UT compensate. Exercises to be done 5 reps 2 times per day. Also showed wrist ext stretches in varying degrees of shoulder extension/scaption to gentle stretch cords throughout the day. Advised OK to do legs/ abs at barre class, but avoid left UE. Pt has a compression sleeve. Advised OK to wear for cording , but use caution that hand is not swelling. Advised to bring in sleeve so we can see.     PATIENT EDUCATION:  Access Code: [email or web address] URL: [email or web address] Date: 02/24/2024 Prepared by: Grayce Sheldon  Exercises - Supine Shoulder Flexion Extension AAROM with Dowel  - 2 x daily - 7 x weekly - 1 sets - 5 reps - 5-10 hold - Corner Pec Major Stretch  - 1 x daily - 7 x weekly - 1 sets - 3 reps - 20 hold - Single Arm Doorway Pec Stretch at 90 Degrees Abduction  - 2 x daily - 7 x weekly - 1 sets - 3 reps - 20 hold - Supine Lower Trunk Rotation  - 1 x daily - 7 x weekly - 1 sets - 3 reps - 10-20 hold    01/19/2024 Education details: Educated pt in axillary cording. Demonstrated release techniques on pt and showed pt gentle stretches to release cords. Supine wand scaption x 5, stargazer supine, wall slide for abd  abeing careful not to let UT compensate. Exercises to be done 5 reps 2 times per day. Also showed wrist  ext stretches in varying degrees of shoulder extension/scaption to gentle stretch cords throughout the day. Advised OK to do legs/ abs at barre class, but avoid left UE. Pt has a compression sleeve. Advised OK to wear for cording , but use caution that hand is not swelling. Advised to bring in sleeve so we can see.  Person educated: Patient Education method: Explanation, Demonstration, and Handouts Education comprehension: verbalized understanding and returned demonstration  HOME EXERCISE PROGRAM: Supine wand  scaption, supine stargazer, standing wall slide abduction, wrist ext in various positions of shoulder to stretch cords  ASSESSMENT:  CLINICAL IMPRESSION: Pt  was very tense throughout with multiple tender points with STM. She required VC's for proper form with exercises and to get herself to relax while doing exercises, but made great improvement and she felt much better at completion of treatment. Shoulder ROM was nearly full with PROM.   EVAL 01/19/2024 Patient is a 53 y.o. female who was seen today for physical therapy evaluation and treatment s/p bilateral Mastectomies with adjacent tissue transfer to close on 09/22/2022 . She was seen for follow up by MD with  noted decrease in left shoulder ROM and cording. She does demonstrate limitations in left shoulder ROM and multiple cords are palpated in the left axilla and running into the forearm. There are no concerns for lymphedema today. She was instructed in some gentle stretches to address cording, and my wear her compression sleeve as comfortable to assist with cording. She will benefit from skilled PT to address deficits and return to PLOF.   OBJECTIVE IMPAIRMENTS: decreased knowledge of condition, decreased ROM, impaired flexibility, impaired UE functional use, postural dysfunction, and pain.   ACTIVITY LIMITATIONS: reach over head  PARTICIPATION LIMITATIONS: able to take part in all but full Barre class, restricted with activities that include reaching  PERSONAL FACTORS: None are also affecting patient's functional outcome.   REHAB POTENTIAL: Excellent  CLINICAL DECISION MAKING: Stable/uncomplicated  EVALUATION COMPLEXITY: Low  GOALS: Goals reviewed with patient? Yes  SHORT TERM GOALS=LONG TERM GOALS: Target date: 03/01/2024  Pt will be independent with a HEP to improve shoulder ROM/strength Baseline: Goal status: INITIAL  2.  Pt will have left shoulder AROM flexion and abd within 5-8 degrees of right shoulder Baseline: Right  152, 180 Goal status: INITIAL  3.  Pts quick dash will improve to no greater than 12% to demonstrate improved function Baseline:  Goal status: INITIAL  4.  Pt will be able to resume left UE strengthening without aggravation of cording Baseline:  Goal status: INITIAL   PLAN:  PT FREQUENCY: 1-2x/week  PT DURATION: 6 weeks  PLANNED INTERVENTIONS: 97164- PT Re-evaluation, 97110-Therapeutic exercises, 97530- Therapeutic activity, [id number]- Neuromuscular re-education, 97535- Self Care, [id number]- Manual therapy, 97760- Orthotic Initial, [id number]- Orthotic/Prosthetic subsequent, and Joint mobilization  PLAN FOR NEXT SESSION: review HEP and progress, STM, cording release, cupping prn, MLD prn,  Grayce JINNY Sheldon, PT 02/24/2024, 9:45 AM  "

## 2024-02-29 ENCOUNTER — Ambulatory Visit: Attending: Nurse Practitioner

## 2024-02-29 DIAGNOSIS — Z9013 Acquired absence of bilateral breasts and nipples: Secondary | ICD-10-CM | POA: Insufficient documentation

## 2024-02-29 DIAGNOSIS — L905 Scar conditions and fibrosis of skin: Secondary | ICD-10-CM | POA: Insufficient documentation

## 2024-02-29 DIAGNOSIS — L7682 Other postprocedural complications of skin and subcutaneous tissue: Secondary | ICD-10-CM | POA: Diagnosis present

## 2024-02-29 DIAGNOSIS — M25612 Stiffness of left shoulder, not elsewhere classified: Secondary | ICD-10-CM | POA: Insufficient documentation

## 2024-02-29 DIAGNOSIS — Z853 Personal history of malignant neoplasm of breast: Secondary | ICD-10-CM | POA: Diagnosis present

## 2024-02-29 NOTE — Therapy (Signed)
 " OUTPATIENT PHYSICAL THERAPY  UPPER EXTREMITY ONCOLOGY TREATMENT  Patient Name: Alexis Chase MRN: [id number] DOB:04/01/1970, 54 y.o., female Today's Date: 02/29/2024  END OF SESSION:  PT End of Session - 02/29/24 0809     Visit Number 3    Number of Visits 12    Date for Recertification  03/01/24    PT Start Time 0810   late   PT Stop Time 0858    PT Time Calculation (min) 48 min    Activity Tolerance Patient tolerated treatment well    Behavior During Therapy [facility] for tasks assessed/performed          Past Medical History:  Diagnosis Date   Anemia    Cancer (HCC) 2015   breast cancer    Closed displaced fracture of acromial end of right clavicle 03/09/2015   Decreased bone mass 08/20/2018   Dysthymia, Rx Lexapro  02/07/2017   Essential hypertension, on Norvasc  02/07/2017   History of breast cancer, Dx 2015, ER +, HER2 -, grade 3, s/p adriamycin and radiation 02/07/2017   Hyperlipidemia, on statin 02/07/2017   Metabolic syndrome 01/25/2018   PCOS (polycystic ovarian syndrome)    Personal history of chemo and radiation therapy for breast cancer    Pure hypercholesterolemia 02/07/2017   S/P laparoscopic sleeve gastrectomy, 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 Tanda 03/29/2018   SUI (stress urinary incontinence, female) 01/25/2018   Use of letrozole  (Femara ) 08/20/2018   Vitamin D  deficiency 02/08/2017   Past Surgical History:  Procedure Laterality Date   CESAREAN SECTION     CHOLECYSTECTOMY     HERNIA REPAIR     LAPAROSCOPIC GASTRIC SLEEVE RESECTION WITH HIATAL HERNIA REPAIR N/A 01/25/2018   Procedure: LAPAROSCOPIC GASTRIC SLEEVE RESECTION WITH HIATAL HERNIA REPAIR, UPPER ENDO AND ERAS PATHWAY;  Surgeon: Tanda Locus, MD;  Location: [facility];  Service: General;  Laterality: N/A;   PORTACATH PLACEMENT     TONSILLECTOMY     TOTAL ABDOMINAL HYSTERECTOMY W/ BILATERAL SALPINGOOPHORECTOMY     Patient Active Problem List   Diagnosis Date Noted   Dehydration 12/09/2022   Obesity  (BMI 30-39.9) 05/06/2019   Hirsutism 05/06/2019   Osteopenia 11/01/2018   Use of letrozole  (Femara ) 08/20/2018   S/P laparoscopic sleeve gastrectomy, 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 Tanda 03/29/2018   SUI (stress urinary incontinence, female) 01/25/2018   Vitamin D  deficiency 02/08/2017   Mixed hyperlipidemia 02/07/2017   Dysthymia 02/07/2017   History of breast cancer, Dx 2015, ER +, HER2 -, grade 3, s/p adriamycin and radiation 02/07/2017    PCP:   REFERRING PROVIDER: Jaclyn Marie White, NP  REFERRING DIAG: s/p Bilateral Mastectomies   THERAPY DIAG:  Personal history of malignant neoplasm of breast  History of bilateral mastectomy  Stiffness of left shoulder, not elsewhere classified  Axillary web syndrome  ONSET DATE: 09/22/2022  Rationale for Evaluation and Treatment: Rehabilitation  SUBJECTIVE:  SUBJECTIVE STATEMENT:  I felt so much better after last time. I have been doing some of the exercises with the dowel. I feel stiff today, and ran the shower on it. I went to [facility] several times too and didn't use my arms much.  PERTINENT HISTORY:  Pt is s/p left lumpectomy with SLNB( 1 or 2) 03/01/2014. She had neoadjuvant chemotherapy at that time and radiation and was on Letrozole  for 5 years  She was diagnosed with Right  triple negative breast cancer in June 2024 and is s/p bilateral Mastectomies with Right SLNB and 0+/3 sentinel LN's. She underwent adjuvant chemotherapy and is scheduled for a bone scan in Dec. 2025. She is back on letrozole .    PAIN:  Are you having pain? No NPRS scale: 2-3/10 Pain location: left axillary region Pain orientation: Left  PAIN TYPE: throbbing and tight Pain description: intermittent  Aggravating factors: raising arm, barre, sometimes at work carrying  samples Relieving factors: not reaching.  PRECAUTIONS: bilateral UE lymphedema risk, Right Clavicle ORIF, hypertension, gastric sleeve, osteopenia  RED FLAGS: None   WEIGHT BEARING RESTRICTIONS: No  FALLS:  Has patient fallen in last 6 months? No  LIVING ENVIRONMENT: Lives with: lives with their family, husband and 39 yr old twin sons Lives in: House/apartment    OCCUPATION: Manufacturing Systems Engineer for [employer]    LEISURE: decorate, travel, barre, spend time with family and friends  HAND DOMINANCE: left   PRIOR LEVEL OF FUNCTION: Independent  PATIENT GOALS: Decrease pain, improve ROM   OBJECTIVE: Note: Objective measures were completed at Evaluation unless otherwise noted.  COGNITION: Overall cognitive status: Within functional limits for tasks assessed   PALPATION: Multiple thin cords noted in axilla with some travelling into forearm, Tight left pectorals  OBSERVATIONS / OTHER ASSESSMENTS: no pitting edema or observable/measureable signs of lymphedema  SENSATION: Light touch: Deficits     POSTURE: forward head, rounded shoulders  UPPER EXTREMITY AROM/PROM:  A/PROM RIGHT   eval   Shoulder extension 54  Shoulder flexion 152  Shoulder abduction 180  Shoulder internal rotation 77  Shoulder external rotation 95    (Blank rows = not tested)  A/PROM LEFT   eval  Shoulder extension 55  Shoulder flexion 142  Shoulder abduction 154  Shoulder internal rotation 54  Shoulder external rotation 88    (Blank rows = not tested)  CERVICAL AROM: All within normal limits:     UPPER EXTREMITY STRENGTH:   LYMPHEDEMA ASSESSMENTS:   SURGERY TYPE/DATE: left lumpectomy with SLNB 03/01/2014. s/p bilateral Mastectomies with Right SLNB and 0+/3 LN's  NUMBER OF LYMPH NODES REMOVED: 0+/3 Left  CHEMOTHERAPY: yes neoadjuvant 2015, Adjuvant 2024  RADIATION:Yes 2016  HORMONE TREATMENT: Letrozole  x 5 years    LYMPHEDEMA ASSESSMENTS:   LANDMARK RIGHT  eval  At  axilla  34.2  15 cm proximal to the proximal aspect of the olecranon process 32.2  10 cm proximal to the proximal aspect of the olecranon process 29  Olecranon process 26.5  15 cm proximal to the proximal aspect of the ulnar styloid process 24.6  10 cm proximal to the proximal aspect of the ulnar styloid process 22.0  Just distal to the ulnar styloid process 16.3  Across hand at thumb web space 19.5  At base of 2nd digit 6.4  (Blank rows = not tested)  LANDMARK LEFT  eval  At axilla  33.9  15 cm proximal to the proximal aspect of the olecranon process 33.0  10 cm proximal to the  proximal aspect of the olecranon process 29.9  Olecranon process 27.0  15 cm proximal to the proximal aspect of the ulnar styloid process 24.2  10 cm proximal to  the proximal aspect of the ulnar styloid process 21.9  Just distal to the ulnar styloid process 16.1  Across hand at thumb web space 19.2  At base of 2nd digit 6.6  (Blank rows = not tested)  Chest circumference just inferior to the axillae:  Chest circumference at the largest point:       QUICK DASH SURVEY: 25%                                                                                                                            TREATMENT DATE:   02/29/2024 STM with cocoa butter to bilateral UT/posterior cervicals, Left pectorals, lateral trunk in supine and left scapular region in Right SL. Supine wand flex and scaption x 5 with multiple VC's to relax, and scapular depression prn to prevent shoulder pain Star gazer x 5 Discussed meditation, importance of letting her arm relax during exercises Recert next visit  02/24/2024 Tried overhead pulleys but pt didn't relax well and got very tired after 1 min so discontinued. Discussed scapular compensation and importance of not letting shoulders ride up which produces shoulder pain. STM with cocoa butter to bilateral UT/posterior cervicals, Left pectorals, lateral trunki n supineand left  scapular region in Right SL. Supine wand flex and scaption with multiple VC's to relax Star gazer x 5 Lower trunk rotation with arms outstretched x 3 ea Pectoral corner stretch and single arm doorway stretch x 2-3 ea Showed pt posterior shoulder rolls and scapular retraction for relaxation/posture PROM Left shoulder Flexion, scaption, abduction, ER with multiple VC's to relax Updated HEP 01/19/2024 Educated pt in axillary cording. Demonstrated release techniques on pt and showed pt gentle stretches to release cords. Supine wand scaption x 5, stargazer supine, wall slide for abd  being careful not to let UT compensate. Exercises to be done 5 reps 2 times per day. Also showed wrist ext stretches in varying degrees of shoulder extension/scaption to gentle stretch cords throughout the day. Advised OK to do legs/ abs at barre class, but avoid left UE. Pt has a compression sleeve. Advised OK to wear for cording , but use caution that hand is not swelling. Advised to bring in sleeve so we can see.     PATIENT EDUCATION:  Access Code: [email or web address] URL: [email or web address] Date: 02/24/2024 Prepared by: Grayce Sheldon  Exercises - Supine Shoulder Flexion Extension AAROM with Dowel  - 2 x daily - 7 x weekly - 1 sets - 5 reps - 5-10 hold - Corner Pec Major Stretch  - 1 x daily - 7 x weekly - 1 sets - 3 reps - 20 hold - Single Arm Doorway Pec Stretch at 90 Degrees Abduction  - 2 x daily - 7 x weekly - 1 sets - 3 reps - 20 hold -  Supine Lower Trunk Rotation  - 1 x daily - 7 x weekly - 1 sets - 3 reps - 10-20 hold    01/19/2024 Education details: Educated pt in axillary cording. Demonstrated release techniques on pt and showed pt gentle stretches to release cords. Supine wand scaption x 5, stargazer supine, wall slide for abd  abeing careful not to let UT compensate. Exercises to be done 5 reps 2 times per day. Also showed wrist ext stretches in varying degrees of shoulder  extension/scaption to gentle stretch cords throughout the day. Advised OK to do legs/ abs at barre class, but avoid left UE. Pt has a compression sleeve. Advised OK to wear for cording , but use caution that hand is not swelling. Advised to bring in sleeve so we can see.  Person educated: Patient Education method: Explanation, Demonstration, and Handouts Education comprehension: verbalized understanding and returned demonstration  HOME EXERCISE PROGRAM: Supine wand scaption, supine stargazer, standing wall slide abduction, wrist ext in various positions of shoulder to stretch cords  ASSESSMENT:  CLINICAL IMPRESSION: Pt with decreased tightness noted with manual work today in  pecs and lateral trunk, but increased tissue tension at left scapular region.  Pt still has difficulty relaxing for wand exercises and requires VC's to depress scapula. Pt overall feeling better since last visit, and was able to do Barre class for legs only several times. Discussed meditation, relaxing music as something that may be helpful for her.  EVAL 01/19/2024 Patient is a 54 y.o. female who was seen today for physical therapy evaluation and treatment s/p bilateral Mastectomies with adjacent tissue transfer to close on 09/22/2022 . She was seen for follow up by MD with  noted decrease in left shoulder ROM and cording. She does demonstrate limitations in left shoulder ROM and multiple cords are palpated in the left axilla and running into the forearm. There are no concerns for lymphedema today. She was instructed in some gentle stretches to address cording, and my wear her compression sleeve as comfortable to assist with cording. She will benefit from skilled PT to address deficits and return to PLOF.   OBJECTIVE IMPAIRMENTS: decreased knowledge of condition, decreased ROM, impaired flexibility, impaired UE functional use, postural dysfunction, and pain.   ACTIVITY LIMITATIONS: reach over head  PARTICIPATION  LIMITATIONS: able to take part in all but full Barre class, restricted with activities that include reaching  PERSONAL FACTORS: None are also affecting patient's functional outcome.   REHAB POTENTIAL: Excellent  CLINICAL DECISION MAKING: Stable/uncomplicated  EVALUATION COMPLEXITY: Low  GOALS: Goals reviewed with patient? Yes  SHORT TERM GOALS=LONG TERM GOALS: Target date: 03/01/2024  Pt will be independent with a HEP to improve shoulder ROM/strength Baseline: Goal status: INITIAL  2.  Pt will have left shoulder AROM flexion and abd within 5-8 degrees of right shoulder Baseline: Right 152, 180 Goal status: INITIAL  3.  Pts quick dash will improve to no greater than 12% to demonstrate improved function Baseline:  Goal status: INITIAL  4.  Pt will be able to resume left UE strengthening without aggravation of cording Baseline:  Goal status: INITIAL   PLAN:  PT FREQUENCY: 1-2x/week  PT DURATION: 6 weeks  PLANNED INTERVENTIONS: 97164- PT Re-evaluation, 97110-Therapeutic exercises, 97530- Therapeutic activity, [id number]- Neuromuscular re-education, 97535- Self Care, [id number]- Manual therapy, 97760- Orthotic Initial, [id number]- Orthotic/Prosthetic subsequent, and Joint mobilization  PLAN FOR NEXT SESSION: RECERT next.review HEP and progress, STM, cording release, cupping prn, MLD prn,  Grayce JINNY Sheldon, PT 02/29/2024, 8:59 AM  "

## 2024-03-01 ENCOUNTER — Ambulatory Visit

## 2024-03-01 DIAGNOSIS — Z853 Personal history of malignant neoplasm of breast: Secondary | ICD-10-CM | POA: Diagnosis not present

## 2024-03-01 DIAGNOSIS — L905 Scar conditions and fibrosis of skin: Secondary | ICD-10-CM

## 2024-03-01 DIAGNOSIS — Z9013 Acquired absence of bilateral breasts and nipples: Secondary | ICD-10-CM

## 2024-03-01 DIAGNOSIS — M25612 Stiffness of left shoulder, not elsewhere classified: Secondary | ICD-10-CM

## 2024-03-01 NOTE — Therapy (Signed)
 " OUTPATIENT PHYSICAL THERAPY  UPPER EXTREMITY ONCOLOGY TREATMENT  Patient Name: Alexis Chase MRN: [id number] DOB:July 26, 1970, 54 y.o., female Today's Date: 03/01/2024  END OF SESSION:  PT End of Session - 03/01/24 0811     Visit Number 4    Number of Visits 16    Date for Recertification  04/12/24    PT Start Time 0812    PT Stop Time 0857    PT Time Calculation (min) 45 min    Activity Tolerance Patient tolerated treatment well    Behavior During Therapy [facility] for tasks assessed/performed          Past Medical History:  Diagnosis Date   Anemia    Cancer (HCC) 2015   breast cancer    Closed displaced fracture of acromial end of right clavicle 03/09/2015   Decreased bone mass 08/20/2018   Dysthymia, Rx Lexapro  02/07/2017   Essential hypertension, on Norvasc  02/07/2017   History of breast cancer, Dx 2015, ER +, HER2 -, grade 3, s/p adriamycin and radiation 02/07/2017   Hyperlipidemia, on statin 02/07/2017   Metabolic syndrome 01/25/2018   PCOS (polycystic ovarian syndrome)    Personal history of chemo and radiation therapy for breast cancer    Pure hypercholesterolemia 02/07/2017   S/P laparoscopic sleeve gastrectomy, 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 Tanda 03/29/2018   SUI (stress urinary incontinence, female) 01/25/2018   Use of letrozole  (Femara ) 08/20/2018   Vitamin D  deficiency 02/08/2017   Past Surgical History:  Procedure Laterality Date   CESAREAN SECTION     CHOLECYSTECTOMY     HERNIA REPAIR     LAPAROSCOPIC GASTRIC SLEEVE RESECTION WITH HIATAL HERNIA REPAIR N/A 01/25/2018   Procedure: LAPAROSCOPIC GASTRIC SLEEVE RESECTION WITH HIATAL HERNIA REPAIR, UPPER ENDO AND ERAS PATHWAY;  Surgeon: Tanda Locus, MD;  Location: [facility];  Service: General;  Laterality: N/A;   PORTACATH PLACEMENT     TONSILLECTOMY     TOTAL ABDOMINAL HYSTERECTOMY W/ BILATERAL SALPINGOOPHORECTOMY     Patient Active Problem List   Diagnosis Date Noted   Dehydration 12/09/2022   Obesity (BMI  30-39.9) 05/06/2019   Hirsutism 05/06/2019   Osteopenia 11/01/2018   Use of letrozole  (Femara ) 08/20/2018   S/P laparoscopic sleeve gastrectomy, 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 Tanda 03/29/2018   SUI (stress urinary incontinence, female) 01/25/2018   Vitamin D  deficiency 02/08/2017   Mixed hyperlipidemia 02/07/2017   Dysthymia 02/07/2017   History of breast cancer, Dx 2015, ER +, HER2 -, grade 3, s/p adriamycin and radiation 02/07/2017    PCP:   REFERRING PROVIDER: Jaclyn Marie White, NP  REFERRING DIAG: s/p Bilateral Mastectomies   THERAPY DIAG:  Personal history of malignant neoplasm of breast  History of bilateral mastectomy  Stiffness of left shoulder, not elsewhere classified  Axillary web syndrome  ONSET DATE: 09/22/2022  Rationale for Evaluation and Treatment: Rehabilitation  SUBJECTIVE:  SUBJECTIVE STATEMENT:  Felt better after the first visit we had, and also last time but then the stiffness returns. The bras I have don't feel supportive enough. Immediately after therapy I feel better. I was able to go to [facility] class several times after the first treatment and just do my leg workout.   I need to figure out something to help me wwhen I am driving to be more comfortable.  PERTINENT HISTORY:  Pt is s/p left lumpectomy with SLNB( 1 or 2) 03/01/2014. She had neoadjuvant chemotherapy at that time and radiation and was on Letrozole  for 5 years  She was diagnosed with Right  triple negative breast cancer in June 2024 and is s/p bilateral Mastectomies with Right SLNB and 0+/3 sentinel LN's. She underwent adjuvant chemotherapy and is scheduled for a bone scan in Dec. 2025. She is back on letrozole .    PAIN:  Are you having pain? No NPRS scale: 2/10 Pain location: left axillary  region Pain orientation: Left  PAIN TYPE: throbbing and tight Pain description: intermittent  Aggravating factors: raising arm, barre, sometimes at work carrying samples Relieving factors: not reaching.  PRECAUTIONS: bilateral UE lymphedema risk, Right Clavicle ORIF, hypertension, gastric sleeve, osteopenia  RED FLAGS: None   WEIGHT BEARING RESTRICTIONS: No  FALLS:  Has patient fallen in last 6 months? No  LIVING ENVIRONMENT: Lives with: lives with their family, husband and 22 yr old twin sons Lives in: House/apartment    OCCUPATION: Manufacturing Systems Engineer for [employer]    LEISURE: decorate, travel, barre, spend time with family and friends  HAND DOMINANCE: left   PRIOR LEVEL OF FUNCTION: Independent  PATIENT GOALS: Decrease pain, improve ROM   OBJECTIVE: Note: Objective measures were completed at Evaluation unless otherwise noted.  COGNITION: Overall cognitive status: Within functional limits for tasks assessed   PALPATION: Multiple thin cords noted in axilla with some travelling into forearm, Tight left pectorals  OBSERVATIONS / OTHER ASSESSMENTS: no pitting edema or observable/measureable signs of lymphedema  SENSATION: Light touch: Deficits     POSTURE: forward head, rounded shoulders  UPPER EXTREMITY AROM/PROM:  A/PROM RIGHT   eval   Shoulder extension 54  Shoulder flexion 152  Shoulder abduction 180  Shoulder internal rotation 77  Shoulder external rotation 95    (Blank rows = not tested)  A/PROM LEFT   eval LEFT 03/02/2023  Shoulder extension 55   Shoulder flexion 142 142  Shoulder abduction 154 162  Shoulder internal rotation 54   Shoulder external rotation 88     (Blank rows = not tested)  CERVICAL AROM: All within normal limits:     UPPER EXTREMITY STRENGTH:   LYMPHEDEMA ASSESSMENTS:   SURGERY TYPE/DATE: left lumpectomy with SLNB 03/01/2014. s/p bilateral Mastectomies with Right SLNB and 0+/3 LN's  NUMBER OF LYMPH NODES REMOVED:  0+/3 Left  CHEMOTHERAPY: yes neoadjuvant 2015, Adjuvant 2024  RADIATION:Yes 2016  HORMONE TREATMENT: Letrozole  x 5 years    LYMPHEDEMA ASSESSMENTS:   LANDMARK RIGHT  eval  At axilla  34.2  15 cm proximal to the proximal aspect of the olecranon process 32.2  10 cm proximal to the proximal aspect of the olecranon process 29  Olecranon process 26.5  15 cm proximal to the proximal aspect of the ulnar styloid process 24.6  10 cm proximal to the proximal aspect of the ulnar styloid process 22.0  Just distal to the ulnar styloid process 16.3  Across hand at thumb web space 19.5  At base of 2nd digit 6.4  (  Blank rows = not tested)  LANDMARK LEFT  eval  At axilla  33.9  15 cm proximal to the proximal aspect of the olecranon process 33.0  10 cm proximal to the proximal aspect of the olecranon process 29.9  Olecranon process 27.0  15 cm proximal to the proximal aspect of the ulnar styloid process 24.2  10 cm proximal to  the proximal aspect of the ulnar styloid process 21.9  Just distal to the ulnar styloid process 16.1  Across hand at thumb web space 19.2  At base of 2nd digit 6.6  (Blank rows = not tested)  Chest circumference just inferior to the axillae:  Chest circumference at the largest point:       QUICK DASH SURVEY: 25%                                                                                                                            TREATMENT DATE:   03/01/2024 STM with cocoa butter to bilateral UT/posterior cervicals, Left pectorals, lateral trunk in supine and left scapular region in Right SL. AROM; alternating shoulder flexion x 5 ea, bilateral shoulder flexion x 5, scaption x 5, horizontal abd x 5, star gazer x 5, snow angels x 5 Measured AROM left shoulder flexion and abduction  Discussed going to [employer] [place] [employer] for more supportive bras. Advised to make appt Discussed importance of depressing scapula for reaching activities  02/29/2024 STM with cocoa  butter to bilateral UT/posterior cervicals, Left pectorals, lateral trunk in supine and left scapular region in Right SL. Supine wand flex and scaption x 5 with multiple VC's to relax, and scapular depression prn to prevent shoulder pain Star gazer x 5 Discussed meditation, importance of letting her arm relax during exercises Recert next visit  02/24/2024 Tried overhead pulleys but pt didn't relax well and got very tired after 1 min so discontinued. Discussed scapular compensation and importance of not letting shoulders ride up which produces shoulder pain. STM with cocoa butter to bilateral UT/posterior cervicals, Left pectorals, lateral trunki n supineand left scapular region in Right SL. Supine wand flex and scaption with multiple VC's to relax Star gazer x 5 Lower trunk rotation with arms outstretched x 3 ea Pectoral corner stretch and single arm doorway stretch x 2-3 ea Showed pt posterior shoulder rolls and scapular retraction for relaxation/posture PROM Left shoulder Flexion, scaption, abduction, ER with multiple VC's to relax Updated HEP 01/19/2024 Educated pt in axillary cording. Demonstrated release techniques on pt and showed pt gentle stretches to release cords. Supine wand scaption x 5, stargazer supine, wall slide for abd  being careful not to let UT compensate. Exercises to be done 5 reps 2 times per day. Also showed wrist ext stretches in varying degrees of shoulder extension/scaption to gentle stretch cords throughout the day. Advised OK to do legs/ abs at barre class, but avoid left UE. Pt has a compression sleeve. Advised OK to wear for cording , but  use caution that hand is not swelling. Advised to bring in sleeve so we can see.     PATIENT EDUCATION:  Access Code: [email or web address] URL: [email or web address] Date: 02/24/2024 Prepared by: Grayce Sheldon  Exercises - Supine Shoulder Flexion Extension AAROM with Dowel  - 2 x daily - 7 x weekly - 1 sets - 5 reps -  5-10 hold - Corner Pec Major Stretch  - 1 x daily - 7 x weekly - 1 sets - 3 reps - 20 hold - Single Arm Doorway Pec Stretch at 90 Degrees Abduction  - 2 x daily - 7 x weekly - 1 sets - 3 reps - 20 hold - Supine Lower Trunk Rotation  - 1 x daily - 7 x weekly - 1 sets - 3 reps - 10-20 hold    01/19/2024 Education details: Educated pt in axillary cording. Demonstrated release techniques on pt and showed pt gentle stretches to release cords. Supine wand scaption x 5, stargazer supine, wall slide for abd  abeing careful not to let UT compensate. Exercises to be done 5 reps 2 times per day. Also showed wrist ext stretches in varying degrees of shoulder extension/scaption to gentle stretch cords throughout the day. Advised OK to do legs/ abs at barre class, but avoid left UE. Pt has a compression sleeve. Advised OK to wear for cording , but use caution that hand is not swelling. Advised to bring in sleeve so we can see.  Person educated: Patient Education method: Explanation, Demonstration, and Handouts Education comprehension: verbalized understanding and returned demonstration  HOME EXERCISE PROGRAM: Supine wand scaption, supine stargazer, standing wall slide abduction, wrist ext in various positions of shoulder to stretch cords  ASSESSMENT:  CLINICAL IMPRESSION: Pt missed 5 weeks of therapy due to illness. She has had good benefit from therapy thus far. Pt with decreased tightness noted with manual work today in  pecs and lateral trunk, but increased tissue tension at left scapular region.  Pt overall feeling better since last visit, and was able to do Barre class for legs only, several times. Discussed meditation, relaxing music as something that may be helpful for her.  She is compliant with her HEP and has improved abd by 8 degrees. She will benefit from continued skilled PT to address deficits and return to PLOF.  EVAL 01/19/2024 Patient is a 54 y.o. female who was seen today for physical  therapy evaluation and treatment s/p bilateral Mastectomies with adjacent tissue transfer to close on 09/22/2022 . She was seen for follow up by MD with  noted decrease in left shoulder ROM and cording. She does demonstrate limitations in left shoulder ROM and multiple cords are palpated in the left axilla and running into the forearm. There are no concerns for lymphedema today. She was instructed in some gentle stretches to address cording, and my wear her compression sleeve as comfortable to assist with cording. She will benefit from skilled PT to address deficits and return to PLOF.   OBJECTIVE IMPAIRMENTS: decreased knowledge of condition, decreased ROM, impaired flexibility, impaired UE functional use, postural dysfunction, and pain.   ACTIVITY LIMITATIONS: reach over head  PARTICIPATION LIMITATIONS: able to take part in all but full Barre class, restricted with activities that include reaching  PERSONAL FACTORS: None are also affecting patient's functional outcome.   REHAB POTENTIAL: Excellent  CLINICAL DECISION MAKING: Stable/uncomplicated  EVALUATION COMPLEXITY: Low  GOALS: Goals reviewed with patient? Yes  SHORT TERM GOALS=LONG TERM GOALS: Target date: 03/01/2024  Pt will be  independent with a HEP to improve shoulder ROM/strength Baseline: Goal status: MET 03/02/2023 2.  Pt will have left shoulder AROM flexion and abd within 5-8 degrees of right shoulder Baseline: Right 152, 180 Goal status: In Progress  3.  Pts quick dash will improve to no greater than 12% to demonstrate improved function Baseline:  Goal status: In Progress  4.  Pt will be able to resume left UE strengthening without aggravation of cording Baseline:  Goal status: In Progress  PLAN:  PT FREQUENCY: 1-2x/week  PT DURATION: 6 weeks  PLANNED INTERVENTIONS: 97164- PT Re-evaluation, 97110-Therapeutic exercises, 97530- Therapeutic activity, [id number]- Neuromuscular re-education, 97535- Self Care, [id number]- Manual  therapy, 97760- Orthotic Initial, [id number]- Orthotic/Prosthetic subsequent, and Joint mobilization  PLAN FOR NEXT SESSION:  next.review HEP and progress, add gentle strength,STM, cording release, cupping prn, MLD prn,  Grayce JINNY Sheldon, PT 03/01/2024, 9:00 AM  OUTPATIENT PHYSICAL THERAPY  UPPER EXTREMITY ONCOLOGY TREATMENT  Patient Name: CHERRE KOTHARI MRN: [id number] DOB:07/01/1970, 54 y.o., female Today's Date: 03/01/2024  END OF SESSION:  PT End of Session - 03/01/24 0811     Visit Number 4    Number of Visits 16    Date for Recertification  04/12/24    PT Start Time 0812    PT Stop Time 0857    PT Time Calculation (min) 45 min    Activity Tolerance Patient tolerated treatment well    Behavior During Therapy [facility] for tasks assessed/performed           Past Medical History:  Diagnosis Date   Anemia    Cancer (HCC) 2015   breast cancer    Closed displaced fracture of acromial end of right clavicle 03/09/2015   Decreased bone mass 08/20/2018   Dysthymia, Rx Lexapro  02/07/2017   Essential hypertension, on Norvasc  02/07/2017   History of breast cancer, Dx 2015, ER +, HER2 -, grade 3, s/p adriamycin and radiation 02/07/2017   Hyperlipidemia, on statin 02/07/2017   Metabolic syndrome 01/25/2018   PCOS (polycystic ovarian syndrome)    Personal history of chemo and radiation therapy for breast cancer    Pure hypercholesterolemia 02/07/2017   S/P laparoscopic sleeve gastrectomy, 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 Tanda 03/29/2018   SUI (stress urinary incontinence, female) 01/25/2018   Use of letrozole  (Femara ) 08/20/2018   Vitamin D  deficiency 02/08/2017   Past Surgical History:  Procedure Laterality Date   CESAREAN SECTION     CHOLECYSTECTOMY     HERNIA REPAIR     LAPAROSCOPIC GASTRIC SLEEVE RESECTION WITH HIATAL HERNIA REPAIR N/A 01/25/2018   Procedure: LAPAROSCOPIC GASTRIC SLEEVE RESECTION WITH HIATAL HERNIA REPAIR, UPPER ENDO AND ERAS PATHWAY;  Surgeon: Tanda Locus, MD;   Location: [facility];  Service: General;  Laterality: N/A;   PORTACATH PLACEMENT     TONSILLECTOMY     TOTAL ABDOMINAL HYSTERECTOMY W/ BILATERAL SALPINGOOPHORECTOMY     Patient Active Problem List   Diagnosis Date Noted   Dehydration 12/09/2022   Obesity (BMI 30-39.9) 05/06/2019   Hirsutism 05/06/2019   Osteopenia 11/01/2018   Use of letrozole  (Femara ) 08/20/2018   S/P laparoscopic sleeve gastrectomy, 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 Tanda 03/29/2018   SUI (stress urinary incontinence, female) 01/25/2018   Vitamin D  deficiency 02/08/2017   Mixed hyperlipidemia 02/07/2017   Dysthymia 02/07/2017   History of breast cancer, Dx 2015, ER +, HER2 -, grade 3, s/p adriamycin and radiation 02/07/2017    PCP:   REFERRING PROVIDER: Jaclyn Marie White, NP  REFERRING DIAG:  s/p Bilateral Mastectomies   THERAPY DIAG:  Personal history of malignant neoplasm of breast  History of bilateral mastectomy  Stiffness of left shoulder, not elsewhere classified  Axillary web syndrome  ONSET DATE: 09/22/2022  Rationale for Evaluation and Treatment: Rehabilitation  SUBJECTIVE:                                                                                                                                                                                           SUBJECTIVE STATEMENT:  I felt so much better after last time. I have been doing some of the exercises with the dowel. I feel stiff today, and ran the shower on it. I went to [facility] several times too and didn't use my arms much.  PERTINENT HISTORY:  Pt is s/p left lumpectomy with SLNB( 1 or 2) 03/01/2014. She had neoadjuvant chemotherapy at that time and radiation and was on Letrozole  for 5 years  She was diagnosed with Right  triple negative breast cancer in June 2024 and is s/p bilateral Mastectomies with Right SLNB and 0+/3 sentinel LN's on 09/02/2022. She underwent adjuvant chemotherapy and is scheduled for a bone scan in Dec. 2025. She is  back on letrozole .    PAIN:  Are you having pain? No NPRS scale: 2-3/10 Pain location: left axillary region Pain orientation: Left  PAIN TYPE: throbbing and tight Pain description: intermittent  Aggravating factors: raising arm, barre, sometimes at work carrying samples Relieving factors: not reaching.  PRECAUTIONS: bilateral UE lymphedema risk, Right Clavicle ORIF, hypertension, gastric sleeve, osteopenia  RED FLAGS: None   WEIGHT BEARING RESTRICTIONS: No  FALLS:  Has patient fallen in last 6 months? 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  COGNITION: Overall cognitive status: Within functional limits for tasks assessed   PALPATION: Multiple thin cords noted in axilla with some travelling into forearm, Tight left pectorals  OBSERVATIONS / OTHER ASSESSMENTS: no pitting edema or observable/measureable signs of lymphedema  SENSATION: Light touch: Deficits     POSTURE: forward head, rounded shoulders  UPPER EXTREMITY AROM/PROM:  A/PROM RIGHT   eval   Shoulder extension 54  Shoulder flexion 152  Shoulder abduction 180  Shoulder internal rotation 77  Shoulder external rotation 95    (Blank rows =  not tested)  A/PROM LEFT   eval LEFT 03/02/2023  Shoulder extension 55   Shoulder flexion 142   Shoulder abduction 154   Shoulder internal rotation 54   Shoulder external rotation 88     (Blank rows = not tested)  CERVICAL AROM: All within normal limits:     UPPER EXTREMITY STRENGTH:   LYMPHEDEMA ASSESSMENTS:   SURGERY TYPE/DATE: left lumpectomy with SLNB  03/01/2014. s/p bilateral Mastectomies with Right SLNB and 0+/3 LN's, 09/02/2022  NUMBER OF LYMPH NODES REMOVED: 0+/3 Left  CHEMOTHERAPY: yes neoadjuvant 2015, Adjuvant 2024  RADIATION:Yes 2016  HORMONE TREATMENT: Letrozole  x 5 years    LYMPHEDEMA ASSESSMENTS:   LANDMARK RIGHT  eval  At axilla  34.2  15 cm proximal to the proximal aspect of the olecranon process 32.2  10 cm proximal to the proximal aspect of the olecranon process 29  Olecranon process 26.5  15 cm proximal to the proximal aspect of the ulnar styloid process 24.6  10 cm proximal to the proximal aspect of the ulnar styloid process 22.0  Just distal to the ulnar styloid process 16.3  Across hand at thumb web space 19.5  At base of 2nd digit 6.4  (Blank rows = not tested)  LANDMARK LEFT  eval  At axilla  33.9  15 cm proximal to the proximal aspect of the olecranon process 33.0  10 cm proximal to the proximal aspect of the olecranon process 29.9  Olecranon process 27.0  15 cm proximal to the proximal aspect of the ulnar styloid process 24.2  10 cm proximal to  the proximal aspect of the ulnar styloid process 21.9  Just distal to the ulnar styloid process 16.1  Across hand at thumb web space 19.2  At base of 2nd digit 6.6  (Blank rows = not tested)  Chest circumference just inferior to the axillae:  Chest circumference at the largest point:       QUICK DASH SURVEY: 25%                                                                                                                            TREATMENT DATE:   02/29/2024 STM with cocoa butter to bilateral UT/posterior cervicals, Left pectorals, lateral trunk in supine and left scapular region in Right SL. Supine wand flex and scaption x 5 with multiple VC's to relax, and scapular depression prn to prevent shoulder pain Star gazer x 5 Discussed meditation, importance of letting her arm relax during exercises Recert next visit  02/24/2024 Tried overhead  pulleys but pt didn't relax well and got very tired after 1 min so discontinued. Discussed scapular compensation and importance of not letting shoulders ride up which produces shoulder pain. STM with cocoa butter to bilateral UT/posterior cervicals, Left pectorals, lateral trunki n supineand left scapular region in Right SL. Supine wand flex and scaption with multiple VC's to relax Star gazer x 5 Lower trunk rotation with arms outstretched x 3 ea  Pectoral corner stretch and single arm doorway stretch x 2-3 ea Showed pt posterior shoulder rolls and scapular retraction for relaxation/posture PROM Left shoulder Flexion, scaption, abduction, ER with multiple VC's to relax Updated HEP 01/19/2024 Educated pt in axillary cording. Demonstrated release techniques on pt and showed pt gentle stretches to release cords. Supine wand scaption x 5, stargazer supine, wall slide for abd  being careful not to let UT compensate. Exercises to be done 5 reps 2 times per day. Also showed wrist ext stretches in varying degrees of shoulder extension/scaption to gentle stretch cords throughout the day. Advised OK to do legs/ abs at barre class, but avoid left UE. Pt has a compression sleeve. Advised OK to wear for cording , but use caution that hand is not swelling. Advised to bring in sleeve so we can see.     PATIENT EDUCATION:  Access Code: [email or web address] URL: [email or web address] Date: 02/24/2024 Prepared by: Grayce Sheldon  Exercises - Supine Shoulder Flexion Extension AAROM with Dowel  - 2 x daily - 7 x weekly - 1 sets - 5 reps - 5-10 hold - Corner Pec Major Stretch  - 1 x daily - 7 x weekly - 1 sets - 3 reps - 20 hold - Single Arm Doorway Pec Stretch at 90 Degrees Abduction  - 2 x daily - 7 x weekly - 1 sets - 3 reps - 20 hold - Supine Lower Trunk Rotation  - 1 x daily - 7 x weekly - 1 sets - 3 reps - 10-20 hold    01/19/2024 Education details: Educated pt in axillary cording. Demonstrated  release techniques on pt and showed pt gentle stretches to release cords. Supine wand scaption x 5, stargazer supine, wall slide for abd  abeing careful not to let UT compensate. Exercises to be done 5 reps 2 times per day. Also showed wrist ext stretches in varying degrees of shoulder extension/scaption to gentle stretch cords throughout the day. Advised OK to do legs/ abs at barre class, but avoid left UE. Pt has a compression sleeve. Advised OK to wear for cording , but use caution that hand is not swelling. Advised to bring in sleeve so we can see.  Person educated: Patient Education method: Explanation, Demonstration, and Handouts Education comprehension: verbalized understanding and returned demonstration  HOME EXERCISE PROGRAM: Supine wand scaption, supine stargazer, standing wall slide abduction, wrist ext in various positions of shoulder to stretch cords  ASSESSMENT:  CLINICAL IMPRESSION: Pt has had limited visits this certification period due to missing 5 weeks of therapy because of illness. Pt is overall feeling much better since her initial visit, and was able to do Barre class for legs only several times. We discussed meditation, relaxing music as something that may be helpful for her to get some relaxation into her day.SABRA  EVAL 01/19/2024 Patient is a 55 y.o. female who was seen today for physical therapy evaluation and treatment s/p bilateral Mastectomies with adjacent tissue transfer to close on 09/22/2022 . She was seen for follow up by MD with  noted decrease in left shoulder ROM and cording. She does demonstrate limitations in left shoulder ROM and multiple cords are palpated in the left axilla and running into the forearm. There are no concerns for lymphedema today. She was instructed in some gentle stretches to address cording, and my wear her compression sleeve as comfortable to assist with cording. She will benefit from skilled PT to address deficits and return to  [facility].   OBJECTIVE IMPAIRMENTS:  decreased knowledge of condition, decreased ROM, impaired flexibility, impaired UE functional use, postural dysfunction, and pain.   ACTIVITY LIMITATIONS: reach over head  PARTICIPATION LIMITATIONS: able to take part in all but full Barre class, restricted with activities that include reaching  PERSONAL FACTORS: None are also affecting patient's functional outcome.   REHAB POTENTIAL: Excellent  CLINICAL DECISION MAKING: Stable/uncomplicated  EVALUATION COMPLEXITY: Low  GOALS: Goals reviewed with patient? Yes  SHORT TERM GOALS=LONG TERM GOALS: Target date: 03/01/2024  Pt will be independent with a HEP to improve shoulder ROM/strength Baseline: Goal status: INITIAL  2.  Pt will have left shoulder AROM flexion and abd within 5-8 degrees of right shoulder Baseline: Right 152, 180 Goal status: INITIAL  3.  Pts quick dash will improve to no greater than 12% to demonstrate improved function Baseline:  Goal status: INITIAL  4.  Pt will be able to resume left UE strengthening without aggravation of cording Baseline:  Goal status: INITIAL   PLAN:  PT FREQUENCY: 1-2x/week  PT DURATION: 6 weeks  PLANNED INTERVENTIONS: 97164- PT Re-evaluation, 97110-Therapeutic exercises, 97530- Therapeutic activity, [id number]- Neuromuscular re-education, 97535- Self Care, [id number]- Manual therapy, 97760- Orthotic Initial, [id number]- Orthotic/Prosthetic subsequent, and Joint mobilization  PLAN FOR NEXT SESSION: RECERT next.review HEP and progress, STM, cording release, cupping prn, MLD prn,  Grayce JINNY Sheldon, PT 03/01/2024, 9:00 AM  "

## 2024-03-03 ENCOUNTER — Other Ambulatory Visit (HOSPITAL_COMMUNITY): Payer: Self-pay | Admitting: Internal Medicine

## 2024-03-07 ENCOUNTER — Ambulatory Visit

## 2024-03-07 DIAGNOSIS — L905 Scar conditions and fibrosis of skin: Secondary | ICD-10-CM

## 2024-03-07 DIAGNOSIS — Z9013 Acquired absence of bilateral breasts and nipples: Secondary | ICD-10-CM

## 2024-03-07 DIAGNOSIS — M25612 Stiffness of left shoulder, not elsewhere classified: Secondary | ICD-10-CM

## 2024-03-07 DIAGNOSIS — Z853 Personal history of malignant neoplasm of breast: Secondary | ICD-10-CM | POA: Diagnosis not present

## 2024-03-07 NOTE — Therapy (Signed)
 " OUTPATIENT PHYSICAL THERAPY  UPPER EXTREMITY ONCOLOGY TREATMENT  Patient Name: Alexis Chase MRN: [id number] DOB:Nov 20, 1970, 54 y.o., female Today's Date: 03/07/2024  END OF SESSION:  PT End of Session - 03/07/24 0805     Visit Number 5    Number of Visits 16    Date for Recertification  04/12/24    PT Start Time 0805    PT Stop Time 0855    PT Time Calculation (min) 50 min    Activity Tolerance Patient tolerated treatment well    Behavior During Therapy [facility] for tasks assessed/performed          Past Medical History:  Diagnosis Date   Anemia    Cancer (HCC) 2015   breast cancer    Closed displaced fracture of acromial end of right clavicle 03/09/2015   Decreased bone mass 08/20/2018   Dysthymia, Rx Lexapro  02/07/2017   Essential hypertension, on Norvasc  02/07/2017   History of breast cancer, Dx 2015, ER +, HER2 -, grade 3, s/p adriamycin and radiation 02/07/2017   Hyperlipidemia, on statin 02/07/2017   Metabolic syndrome 01/25/2018   PCOS (polycystic ovarian syndrome)    Personal history of chemo and radiation therapy for breast cancer    Pure hypercholesterolemia 02/07/2017   S/P laparoscopic sleeve gastrectomy, 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 Tanda 03/29/2018   SUI (stress urinary incontinence, female) 01/25/2018   Use of letrozole  (Femara ) 08/20/2018   Vitamin D  deficiency 02/08/2017   Past Surgical History:  Procedure Laterality Date   CESAREAN SECTION     CHOLECYSTECTOMY     HERNIA REPAIR     LAPAROSCOPIC GASTRIC SLEEVE RESECTION WITH HIATAL HERNIA REPAIR N/A 01/25/2018   Procedure: LAPAROSCOPIC GASTRIC SLEEVE RESECTION WITH HIATAL HERNIA REPAIR, UPPER ENDO AND ERAS PATHWAY;  Surgeon: Tanda Locus, MD;  Location: [facility];  Service: General;  Laterality: N/A;   PORTACATH PLACEMENT     TONSILLECTOMY     TOTAL ABDOMINAL HYSTERECTOMY W/ BILATERAL SALPINGOOPHORECTOMY     Patient Active Problem List   Diagnosis Date Noted   Dehydration 12/09/2022   Obesity (BMI  30-39.9) 05/06/2019   Hirsutism 05/06/2019   Osteopenia 11/01/2018   Use of letrozole  (Femara ) 08/20/2018   S/P laparoscopic sleeve gastrectomy, 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 Tanda 03/29/2018   SUI (stress urinary incontinence, female) 01/25/2018   Vitamin D  deficiency 02/08/2017   Mixed hyperlipidemia 02/07/2017   Dysthymia 02/07/2017   History of breast cancer, Dx 2015, ER +, HER2 -, grade 3, s/p adriamycin and radiation 02/07/2017    PCP:   REFERRING PROVIDER: Jaclyn Marie White, NP  REFERRING DIAG: s/p Bilateral Mastectomies   THERAPY DIAG:  Personal history of malignant neoplasm of breast  History of bilateral mastectomy  Stiffness of left shoulder, not elsewhere classified  Axillary web syndrome  ONSET DATE: 09/22/2022  Rationale for Evaluation and Treatment: Rehabilitation  SUBJECTIVE:  SUBJECTIVE STATEMENT:  Did well over the weekendm and have been working on my exercises  PERTINENT HISTORY:  Pt is s/p left lumpectomy with SLNB( 1 or 2) 03/01/2014. She had neoadjuvant chemotherapy at that time and radiation and was on Letrozole  for 5 years  She was diagnosed with Right  triple negative breast cancer in June 2024 and is s/p bilateral Mastectomies with Right SLNB and 0+/3 sentinel LN's. She underwent adjuvant chemotherapy and is scheduled for a bone scan in Dec. 2025. She is back on letrozole .    PAIN:  Are you having pain? No NPRS scale: 2/10 Pain location: left axillary region Pain orientation: Left  PAIN TYPE: throbbing and tight Pain description: intermittent  Aggravating factors: raising arm, barre, sometimes at work carrying samples Relieving factors: not reaching.  PRECAUTIONS: bilateral UE lymphedema risk, Right Clavicle ORIF, hypertension, gastric sleeve,  osteopenia  RED FLAGS: None   WEIGHT BEARING RESTRICTIONS: No  FALLS:  Has patient fallen in last 6 months? No  LIVING ENVIRONMENT: Lives with: lives with their family, husband and 32 yr old twin sons Lives in: House/apartment    OCCUPATION: Manufacturing Systems Engineer for [employer]    LEISURE: decorate, travel, barre, spend time with family and friends  HAND DOMINANCE: left   PRIOR LEVEL OF FUNCTION: Independent  PATIENT GOALS: Decrease pain, improve ROM   OBJECTIVE: Note: Objective measures were completed at Evaluation unless otherwise noted.  COGNITION: Overall cognitive status: Within functional limits for tasks assessed   PALPATION: Multiple thin cords noted in axilla with some travelling into forearm, Tight left pectorals  OBSERVATIONS / OTHER ASSESSMENTS: no pitting edema or observable/measureable signs of lymphedema  SENSATION: Light touch: Deficits     POSTURE: forward head, rounded shoulders  UPPER EXTREMITY AROM/PROM:  A/PROM RIGHT   eval   Shoulder extension 54  Shoulder flexion 152  Shoulder abduction 180  Shoulder internal rotation 77  Shoulder external rotation 95    (Blank rows = not tested)  A/PROM LEFT   eval LEFT 03/02/2023  Shoulder extension 55   Shoulder flexion 142 142  Shoulder abduction 154 162  Shoulder internal rotation 54   Shoulder external rotation 88     (Blank rows = not tested)  CERVICAL AROM: All within normal limits:     UPPER EXTREMITY STRENGTH:   LYMPHEDEMA ASSESSMENTS:   SURGERY TYPE/DATE: left lumpectomy with SLNB 03/01/2014. s/p bilateral Mastectomies with Right SLNB and 0+/3 LN's  NUMBER OF LYMPH NODES REMOVED: 0+/3 Left  CHEMOTHERAPY: yes neoadjuvant 2015, Adjuvant 2024  RADIATION:Yes 2016  HORMONE TREATMENT: Letrozole  x 5 years    LYMPHEDEMA ASSESSMENTS:   LANDMARK RIGHT  eval  At axilla  34.2  15 cm proximal to the proximal aspect of the olecranon process 32.2  10 cm proximal to the proximal  aspect of the olecranon process 29  Olecranon process 26.5  15 cm proximal to the proximal aspect of the ulnar styloid process 24.6  10 cm proximal to the proximal aspect of the ulnar styloid process 22.0  Just distal to the ulnar styloid process 16.3  Across hand at thumb web space 19.5  At base of 2nd digit 6.4  (Blank rows = not tested)  LANDMARK LEFT  eval  At axilla  33.9  15 cm proximal to the proximal aspect of the olecranon process 33.0  10 cm proximal to the proximal aspect of the olecranon process 29.9  Olecranon process 27.0  15 cm proximal to the proximal aspect of the ulnar styloid  process 24.2  10 cm proximal to  the proximal aspect of the ulnar styloid process 21.9  Just distal to the ulnar styloid process 16.1  Across hand at thumb web space 19.2  At base of 2nd digit 6.6  (Blank rows = not tested)  Chest circumference just inferior to the axillae:  Chest circumference at the largest point:       QUICK DASH SURVEY: 25%                                                                                                                            TREATMENT DATE: 03/08/2023 Ball rolls on wall x 5 flex, x 4 abd held due to pain Supine bilateral AROM flexion, scaption, horizontal abd each x 5, snow angels x7 Supine horizontal abd with yellow x 5 band Standing scapular retraction and shoulder extension with yellow, x 5 ea STM with cocoa butter to bilateral UT/posterior cervicals, Left pectorals, lateral trunk in supine. Cording release to left UE axilla to forearm PROM Left shoulder flex, scaption, abd, ER with multiple VC's to relax   03/01/2024 STM with cocoa butter to bilateral UT/posterior cervicals, Left pectorals, lateral trunk in supine and left scapular region in Right SL. AROM; alternating shoulder flexion x 5 ea, bilateral shoulder flexion x 5, scaption x 5, horizontal abd x 5, star gazer x 5, snow angels x 5 Measured AROM left shoulder flexion and abduction   Discussed going to [employer] [place] for more supportive bras. Advised to make appt Discussed importance of depressing scapula for reaching activities  02/29/2024 STM with cocoa butter to bilateral UT/posterior cervicals, Left pectorals, lateral trunk in supine and left scapular region in Right SL. Supine wand flex and scaption x 5 with multiple VC's to relax, and scapular depression prn to prevent shoulder pain Star gazer x 5 Discussed meditation, importance of letting her arm relax during exercises Recert next visit  02/24/2024 Tried overhead pulleys but pt didn't relax well and got very tired after 1 min so discontinued. Discussed scapular compensation and importance of not letting shoulders ride up which produces shoulder pain. STM with cocoa butter to bilateral UT/posterior cervicals, Left pectorals, lateral trunki n supineand left scapular region in Right SL. Supine wand flex and scaption with multiple VC's to relax Star gazer x 5 Lower trunk rotation with arms outstretched x 3 ea Pectoral corner stretch and single arm doorway stretch x 2-3 ea Showed pt posterior shoulder rolls and scapular retraction for relaxation/posture PROM Left shoulder Flexion, scaption, abduction, ER with multiple VC's to relax Updated HEP 01/19/2024 Educated pt in axillary cording. Demonstrated release techniques on pt and showed pt gentle stretches to release cords. Supine wand scaption x 5, stargazer supine, wall slide for abd  being careful not to let UT compensate. Exercises to be done 5 reps 2 times per day. Also showed wrist ext stretches in varying degrees of shoulder extension/scaption to gentle stretch cords throughout the day. Advised OK  to do legs/ abs at barre class, but avoid left UE. Pt has a compression sleeve. Advised OK to wear for cording , but use caution that hand is not swelling. Advised to bring in sleeve so we can see.     PATIENT EDUCATION:  Access Code: [email or web address] URL:  [email or web address] Date: 02/24/2024 Prepared by: Grayce Sheldon  Exercises - Supine Shoulder Flexion Extension AAROM with Dowel  - 2 x daily - 7 x weekly - 1 sets - 5 reps - 5-10 hold - Corner Pec Major Stretch  - 1 x daily - 7 x weekly - 1 sets - 3 reps - 20 hold - Single Arm Doorway Pec Stretch at 90 Degrees Abduction  - 2 x daily - 7 x weekly - 1 sets - 3 reps - 20 hold - Supine Lower Trunk Rotation  - 1 x daily - 7 x weekly - 1 sets - 3 reps - 10-20 hold    01/19/2024 Education details: Educated pt in axillary cording. Demonstrated release techniques on pt and showed pt gentle stretches to release cords. Supine wand scaption x 5, stargazer supine, wall slide for abd  abeing careful not to let UT compensate. Exercises to be done 5 reps 2 times per day. Also showed wrist ext stretches in varying degrees of shoulder extension/scaption to gentle stretch cords throughout the day. Advised OK to do legs/ abs at barre class, but avoid left UE. Pt has a compression sleeve. Advised OK to wear for cording , but use caution that hand is not swelling. Advised to bring in sleeve so we can see.  Person educated: Patient Education method: Explanation, Demonstration, and Handouts Education comprehension: verbalized understanding and returned demonstration  HOME EXERCISE PROGRAM: Supine wand scaption, supine stargazer, standing wall slide abduction, wrist ext in various positions of shoulder to stretch cords  ASSESSMENT:  CLINICAL IMPRESSION: Pts cording less noticeable today, and pt is learning how to relax her shoulder for PROM when given cues. Overall improved but shortened areas still noted in pecctorals and Lateral trunk. Pt still experiences pain in left shoulder when she compensates with overhead activities. Initiated light strengthening today to address this. EVAL 01/19/2024 Patient is a 54 y.o. female who was seen today for physical therapy evaluation and treatment s/p  bilateral Mastectomies with adjacent tissue transfer to close on 09/22/2022 . She was seen for follow up by MD with  noted decrease in left shoulder ROM and cording. She does demonstrate limitations in left shoulder ROM and multiple cords are palpated in the left axilla and running into the forearm. There are no concerns for lymphedema today. She was instructed in some gentle stretches to address cording, and my wear her compression sleeve as comfortable to assist with cording. She will benefit from skilled PT to address deficits and return to PLOF.   OBJECTIVE IMPAIRMENTS: decreased knowledge of condition, decreased ROM, impaired flexibility, impaired UE functional use, postural dysfunction, and pain.   ACTIVITY LIMITATIONS: reach over head  PARTICIPATION LIMITATIONS: able to take part in all but full Barre class, restricted with activities that include reaching  PERSONAL FACTORS: None are also affecting patient's functional outcome.   REHAB POTENTIAL: Excellent  CLINICAL DECISION MAKING: Stable/uncomplicated  EVALUATION COMPLEXITY: Low  GOALS: Goals reviewed with patient? Yes  SHORT TERM GOALS=LONG TERM GOALS: Target date: 03/01/2024  Pt will be independent with a HEP to improve shoulder ROM/strength Baseline: Goal status: MET 03/02/2023 2.  Pt will have left shoulder AROM flexion and abd within 5-8 degrees  of right shoulder Baseline: Right 152, 180 Goal status: In Progress  3.  Pts quick dash will improve to no greater than 12% to demonstrate improved function Baseline:  Goal status: In Progress  4.  Pt will be able to resume left UE strengthening without aggravation of cording Baseline:  Goal status: In Progress  PLAN:  PT FREQUENCY: 1-2x/week  PT DURATION: 6 weeks  PLANNED INTERVENTIONS: [id number]- PT Re-evaluation, 97110-Therapeutic exercises, 97530- Therapeutic activity, [id number]- Neuromuscular re-education, 97535- Self Care, [id number]- Manual therapy, 97760- Orthotic Initial,  [id number]- Orthotic/Prosthetic subsequent, and Joint mobilization  PLAN FOR NEXT SESSION:  Add supine ER,next.review HEP and progress, add gentle strength,STM, cording release, cupping prn, MLD prn,  Grayce JINNY Sheldon, PT 03/07/2024, 9:03 AM  OUTPATIENT PHYSICAL THERAPY  UPPER EXTREMITY ONCOLOGY TREATMENT  Patient Name: Alexis Chase MRN: [id number] DOB:Aug 11, 1970, 54 y.o., female Today's Date: 03/07/2024  END OF SESSION:  PT End of Session - 03/07/24 0805     Visit Number 5    Number of Visits 16    Date for Recertification  04/12/24    PT Start Time 0805    PT Stop Time 0855    PT Time Calculation (min) 50 min    Activity Tolerance Patient tolerated treatment well    Behavior During Therapy [facility] for tasks assessed/performed           Past Medical History:  Diagnosis Date   Anemia    Cancer (HCC) 2015   breast cancer    Closed displaced fracture of acromial end of right clavicle 03/09/2015   Decreased bone mass 08/20/2018   Dysthymia, Rx Lexapro  02/07/2017   Essential hypertension, on Norvasc  02/07/2017   History of breast cancer, Dx 2015, ER +, HER2 -, grade 3, s/p adriamycin and radiation 02/07/2017   Hyperlipidemia, on statin 02/07/2017   Metabolic syndrome 01/25/2018   PCOS (polycystic ovarian syndrome)    Personal history of chemo and radiation therapy for breast cancer    Pure hypercholesterolemia 02/07/2017   S/P laparoscopic sleeve gastrectomy, 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 Tanda 03/29/2018   SUI (stress urinary incontinence, female) 01/25/2018   Use of letrozole  (Femara ) 08/20/2018   Vitamin D  deficiency 02/08/2017   Past Surgical History:  Procedure Laterality Date   CESAREAN SECTION     CHOLECYSTECTOMY     HERNIA REPAIR     LAPAROSCOPIC GASTRIC SLEEVE RESECTION WITH HIATAL HERNIA REPAIR N/A 01/25/2018   Procedure: LAPAROSCOPIC GASTRIC SLEEVE RESECTION WITH HIATAL HERNIA REPAIR, UPPER ENDO AND ERAS PATHWAY;  Surgeon: Tanda Locus, MD;  Location: [facility];   Service: General;  Laterality: N/A;   PORTACATH PLACEMENT     TONSILLECTOMY     TOTAL ABDOMINAL HYSTERECTOMY W/ BILATERAL SALPINGOOPHORECTOMY     Patient Active Problem List   Diagnosis Date Noted   Dehydration 12/09/2022   Obesity (BMI 30-39.9) 05/06/2019   Hirsutism 05/06/2019   Osteopenia 11/01/2018   Use of letrozole  (Femara ) 08/20/2018   S/P laparoscopic sleeve gastrectomy, 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 Tanda 03/29/2018   SUI (stress urinary incontinence, female) 01/25/2018   Vitamin D  deficiency 02/08/2017   Mixed hyperlipidemia 02/07/2017   Dysthymia 02/07/2017   History of breast cancer, Dx 2015, ER +, HER2 -, grade 3, s/p adriamycin and radiation 02/07/2017    PCP:   REFERRING PROVIDER: Jaclyn Marie White, NP  REFERRING DIAG: s/p Bilateral Mastectomies   THERAPY DIAG:  Personal history of malignant neoplasm of breast  History of bilateral mastectomy  Stiffness of left shoulder,  not elsewhere classified  Axillary web syndrome  ONSET DATE: 09/22/2022  Rationale for Evaluation and Treatment: Rehabilitation  SUBJECTIVE:                                                                                                                                                                                           SUBJECTIVE STATEMENT:   Did pretty well over the weekend. I have been doing some of the exercises.  PERTINENT HISTORY:  Pt is s/p left lumpectomy with SLNB( 1 or 2) 03/01/2014. She had neoadjuvant chemotherapy at that time and radiation and was on Letrozole  for 5 years  She was diagnosed with Right  triple negative breast cancer in June 2024 and is s/p bilateral Mastectomies with Right SLNB and 0+/3 sentinel LN's on 09/02/2022. She underwent adjuvant chemotherapy and is scheduled for a bone scan in Dec. 2025. She is back on letrozole .    PAIN:  Are you having pain? , just a little stiff NPRS scale: 2/10 Pain location: left axillary region Pain  orientation: Left  PAIN TYPE: throbbing and tight Pain description: intermittent  Aggravating factors: raising arm, barre, sometimes at work carrying samples Relieving factors: not reaching.  PRECAUTIONS: bilateral UE lymphedema risk, Right Clavicle ORIF, hypertension, gastric sleeve, osteopenia  RED FLAGS: None   WEIGHT BEARING RESTRICTIONS: No  FALLS:  Has patient fallen in last 6 months? 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  COGNITION: Overall cognitive status: Within functional limits for tasks assessed   PALPATION: Multiple thin cords noted in axilla with some travelling into forearm, Tight left pectorals  OBSERVATIONS / OTHER ASSESSMENTS: no pitting edema or observable/measureable signs of lymphedema  SENSATION: Light touch: Deficits     POSTURE: forward head, rounded shoulders  UPPER EXTREMITY AROM/PROM:  A/PROM RIGHT   eval   Shoulder extension 54  Shoulder flexion 152  Shoulder abduction 180  Shoulder internal rotation 77  Shoulder external rotation 95    (Blank rows = not tested)  A/PROM LEFT   eval LEFT 03/02/2023  Shoulder extension 55   Shoulder flexion 142   Shoulder abduction 154   Shoulder internal rotation 54   Shoulder external rotation 88     (Blank rows = not tested)  CERVICAL AROM:  All within normal limits:     UPPER EXTREMITY STRENGTH:   LYMPHEDEMA ASSESSMENTS:   SURGERY TYPE/DATE: left lumpectomy with SLNB 03/01/2014. s/p bilateral Mastectomies with Right SLNB and 0+/3 LN's, 09/02/2022  NUMBER OF LYMPH NODES REMOVED: 0+/3  Left  CHEMOTHERAPY: yes neoadjuvant 2015, Adjuvant 2024  RADIATION:Yes 2016  HORMONE TREATMENT: Letrozole  x 5 years    LYMPHEDEMA ASSESSMENTS:   LANDMARK RIGHT  eval  At axilla  34.2  15 cm proximal to the proximal aspect of the olecranon process 32.2  10 cm proximal to the proximal aspect of the olecranon process 29  Olecranon process 26.5  15 cm proximal to the proximal aspect of the ulnar styloid process 24.6  10 cm proximal to the proximal aspect of the ulnar styloid process 22.0  Just distal to the ulnar styloid process 16.3  Across hand at thumb web space 19.5  At base of 2nd digit 6.4  (Blank rows = not tested)  LANDMARK LEFT  eval  At axilla  33.9  15 cm proximal to the proximal aspect of the olecranon process 33.0  10 cm proximal to the proximal aspect of the olecranon process 29.9  Olecranon process 27.0  15 cm proximal to the proximal aspect of the ulnar styloid process 24.2  10 cm proximal to  the proximal aspect of the ulnar styloid process 21.9  Just distal to the ulnar styloid process 16.1  Across hand at thumb web space 19.2  At base of 2nd digit 6.6  (Blank rows = not tested)  Chest circumference just inferior to the axillae:  Chest circumference at the largest point:       QUICK DASH SURVEY: 25%                                                                                                                            TREATMENT DATE:   03/07/2024 Ball rolls on wall x 5 flexion, x 4 abd (held due to pain in shoulder) Supine AROM Flexion x 3, scaption x 3, horizontal abd x 3, snow angels x 3    02/29/2024 STM with cocoa butter to bilateral UT/posterior cervicals, Left pectorals, lateral trunk in supine and left scapular region in Right SL. Supine wand flex and scaption x 5 with multiple VC's to relax, and scapular depression prn to prevent shoulder pain Star gazer x 5 Discussed meditation, importance of letting her arm relax during  exercises Recert next visit  02/24/2024 Tried overhead pulleys but pt didn't relax well and got very tired after 1 min so discontinued. Discussed scapular compensation and importance of not letting shoulders ride up which produces shoulder pain. STM with cocoa butter to bilateral UT/posterior cervicals, Left pectorals, lateral trunki n supineand left scapular region in Right SL. Supine wand flex and scaption with multiple VC's to relax Star gazer x 5 Lower trunk rotation with arms outstretched x 3 ea Pectoral corner stretch and single arm doorway stretch x 2-3 ea Showed  pt posterior shoulder rolls and scapular retraction for relaxation/posture PROM Left shoulder Flexion, scaption, abduction, ER with multiple VC's to relax Updated HEP 01/19/2024 Educated pt in axillary cording. Demonstrated release techniques on pt and showed pt gentle stretches to release cords. Supine wand scaption x 5, stargazer supine, wall slide for abd  being careful not to let UT compensate. Exercises to be done 5 reps 2 times per day. Also showed wrist ext stretches in varying degrees of shoulder extension/scaption to gentle stretch cords throughout the day. Advised OK to do legs/ abs at barre class, but avoid left UE. Pt has a compression sleeve. Advised OK to wear for cording , but use caution that hand is not swelling. Advised to bring in sleeve so we can see.     PATIENT EDUCATION:  Access Code: [email or web address] URL: [email or web address] Date: 02/24/2024 Prepared by: Grayce Sheldon  Exercises - Supine Shoulder Flexion Extension AAROM with Dowel  - 2 x daily - 7 x weekly - 1 sets - 5 reps - 5-10 hold - Corner Pec Major Stretch  - 1 x daily - 7 x weekly - 1 sets - 3 reps - 20 hold - Single Arm Doorway Pec Stretch at 90 Degrees Abduction  - 2 x daily - 7 x weekly - 1 sets - 3 reps - 20 hold - Supine Lower Trunk Rotation  - 1 x daily - 7 x weekly - 1 sets - 3 reps - 10-20 hold    01/19/2024 Education  details: Educated pt in axillary cording. Demonstrated release techniques on pt and showed pt gentle stretches to release cords. Supine wand scaption x 5, stargazer supine, wall slide for abd  abeing careful not to let UT compensate. Exercises to be done 5 reps 2 times per day. Also showed wrist ext stretches in varying degrees of shoulder extension/scaption to gentle stretch cords throughout the day. Advised OK to do legs/ abs at barre class, but avoid left UE. Pt has a compression sleeve. Advised OK to wear for cording , but use caution that hand is not swelling. Advised to bring in sleeve so we can see.  Person educated: Patient Education method: Explanation, Demonstration, and Handouts Education comprehension: verbalized understanding and returned demonstration  HOME EXERCISE PROGRAM: Supine wand scaption, supine stargazer, standing wall slide abduction, wrist ext in various positions of shoulder to stretch cords  ASSESSMENT:  CLINICAL IMPRESSION: Pt has had limited visits this certification period due to missing 5 weeks of therapy because of illness. Pt is overall feeling much better since her initial visit, and was able to do Barre class for legs only several times. We discussed meditation, relaxing music as something that may be helpful for her to get some relaxation into her day.Alexis Chase  EVAL 01/19/2024 Patient is a 54 y.o. female who was seen today for physical therapy evaluation and treatment s/p bilateral Mastectomies with adjacent tissue transfer to close on 09/22/2022 . She was seen for follow up by MD with  noted decrease in left shoulder ROM and cording. She does demonstrate limitations in left shoulder ROM and multiple cords are palpated in the left axilla and running into the forearm. There are no concerns for lymphedema today. She was instructed in some gentle stretches to address cording, and my wear her compression sleeve as comfortable to assist with cording. She will benefit from  skilled PT to address deficits and return to PLOF.   OBJECTIVE IMPAIRMENTS: decreased knowledge of condition, decreased ROM, impaired flexibility, impaired UE functional use,  postural dysfunction, and pain.   ACTIVITY LIMITATIONS: reach over head  PARTICIPATION LIMITATIONS: able to take part in all but full Barre class, restricted with activities that include reaching  PERSONAL FACTORS: None are also affecting patient's functional outcome.   REHAB POTENTIAL: Excellent  CLINICAL DECISION MAKING: Stable/uncomplicated  EVALUATION COMPLEXITY: Low  GOALS: Goals reviewed with patient? Yes  SHORT TERM GOALS=LONG TERM GOALS: Target date: 03/01/2024  Pt will be independent with a HEP to improve shoulder ROM/strength Baseline: Goal status: INITIAL  2.  Pt will have left shoulder AROM flexion and abd within 5-8 degrees of right shoulder Baseline: Right 152, 180 Goal status: INITIAL  3.  Pts quick dash will improve to no greater than 12% to demonstrate improved function Baseline:  Goal status: INITIAL  4.  Pt will be able to resume left UE strengthening without aggravation of cording Baseline:  Goal status: INITIAL   PLAN:  PT FREQUENCY: 1-2x/week  PT DURATION: 6 weeks  PLANNED INTERVENTIONS: 97164- PT Re-evaluation, 97110-Therapeutic exercises, 97530- Therapeutic activity, [id number]- Neuromuscular re-education, 97535- Self Care, 02859- Manual therapy, 97760- Orthotic Initial, [id number]- Orthotic/Prosthetic subsequent, and Joint mobilization  PLAN FOR NEXT SESSION:.review HEP and progress, STM, cording release, cupping prn, MLD prn,  Grayce JINNY Sheldon, PT 03/07/2024, 9:03 AM  "

## 2024-03-07 NOTE — Patient Instructions (Signed)
 Over Head Pull: Narrow and Wide Grip   Cancer Rehab [phone]   On back, knees bent, feet flat, band across thighs, elbows straight but relaxed. Pull hands apart (start). Keeping elbows straight, bring arms up and over head, hands toward floor. Keep pull steady on band. Hold momentarily. Return slowly, keeping pull steady, back to start. Then do same with a wider grip on the band (past shoulder width) Repeat _5-10__ times. Band color __yellow____   Side Pull: Double Arm    Shoulder Rotation: Double Arm   On back, knees bent, feet flat, elbows tucked at sides, bent 90, hands palms up. Pull hands apart and down toward floor, keeping elbows near sides. Hold momentarily. Slowly return to starting position. Repeat _5-10__ times. Band color __yellow____

## 2024-03-09 ENCOUNTER — Telehealth (HOSPITAL_COMMUNITY): Payer: Self-pay | Admitting: Pharmacy Technician

## 2024-03-09 NOTE — Telephone Encounter (Signed)
 Auth Submission: APPROVED Site of care: [facility] Payer: [employer] [employer] Medication & CPT/J Code(s) submitted: Prolia  (Denosumab ) [id number] Diagnosis Code:  Route of submission (phone, fax, portal): PHONE Phone # [phone] CAREL[facility] Fax # Auth type: Buy/Bill HB Units/visits requested: 60mg  x 2 doses, q 6 months Reference number: [id number] Approval from: 03/09/24 to 03/09/25    [name], CPhT [name] Phone: [phone] 03/09/2024

## 2024-03-10 ENCOUNTER — Encounter (HOSPITAL_COMMUNITY): Payer: Self-pay | Admitting: Internal Medicine

## 2024-03-11 ENCOUNTER — Ambulatory Visit

## 2024-03-14 ENCOUNTER — Ambulatory Visit

## 2024-03-15 ENCOUNTER — Telehealth: Payer: Self-pay

## 2024-03-15 ENCOUNTER — Ambulatory Visit

## 2024-03-15 NOTE — Telephone Encounter (Signed)
 Called pt due to missed appt. She has food poisoning and is very ill. Apologized for not calling to cancel. Reminded about appt for next week.

## 2024-03-15 NOTE — Telephone Encounter (Signed)
 Duplicate note; pt missed appt today due to illness

## 2024-03-21 ENCOUNTER — Ambulatory Visit

## 2024-03-23 ENCOUNTER — Ambulatory Visit

## 2024-03-24 ENCOUNTER — Telehealth: Payer: Self-pay | Admitting: Pharmacy Technician

## 2024-03-24 NOTE — Telephone Encounter (Addendum)
 Patient would like to remain at [facility].  Auth Submission: APPROVED Site of care: Site of care: [facility] Payer: [employer] Medication & CPT/J Code(s) submitted: Prolia  (Denosumab ) [id number] Diagnosis Code:  Route of submission (phone, fax, portal):  Phone #[phone] Fax #[phone] Auth type: Buy/Bill PB Units/visits requested: 32M Q6MONTHS X2 DOSES Reference number: [id number] Approval from: 03/24/24 - 03/24/25

## 2024-03-25 ENCOUNTER — Encounter: Payer: Self-pay | Admitting: Internal Medicine

## 2024-03-31 ENCOUNTER — Ambulatory Visit: Attending: Nurse Practitioner

## 2024-03-31 DIAGNOSIS — Z853 Personal history of malignant neoplasm of breast: Secondary | ICD-10-CM

## 2024-03-31 DIAGNOSIS — M25612 Stiffness of left shoulder, not elsewhere classified: Secondary | ICD-10-CM

## 2024-03-31 DIAGNOSIS — Z9013 Acquired absence of bilateral breasts and nipples: Secondary | ICD-10-CM

## 2024-03-31 DIAGNOSIS — L7682 Other postprocedural complications of skin and subcutaneous tissue: Secondary | ICD-10-CM

## 2024-03-31 NOTE — Therapy (Signed)
 "   OUTPATIENT PHYSICAL THERAPY  UPPER EXTREMITY ONCOLOGY TREATMENT  Patient Name: Alexis Chase MRN: [id number] DOB:[birth year], 54 y.o., female Today's Date: 03/31/2024  END OF SESSION:  PT End of Session - 03/31/24 0804     Visit Number 6    Number of Visits 16    Date for Recertification  04/12/24    PT Start Time 0805    PT Stop Time 0857    PT Time Calculation (min) 52 min    Activity Tolerance Patient tolerated treatment well    Behavior During Therapy [facility] for tasks assessed/performed           Past Medical History:  Diagnosis Date   Anemia    Cancer (HCC) 2015   breast cancer    Closed displaced fracture of acromial end of right clavicle 03/09/2015   Decreased bone mass 08/20/2018   Dysthymia, Rx Lexapro  02/07/2017   Essential hypertension, on Norvasc  02/07/2017   History of breast cancer, Dx 2015, ER +, HER2 -, grade 3, s/p adriamycin and radiation 02/07/2017   Hyperlipidemia, on statin 02/07/2017   Metabolic syndrome 01/25/2018   PCOS (polycystic ovarian syndrome)    Personal history of chemo and radiation therapy for breast cancer    Pure hypercholesterolemia 02/07/2017   S/P laparoscopic sleeve gastrectomy, 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 Tanda 03/29/2018   SUI (stress urinary incontinence, female) 01/25/2018   Use of letrozole  (Femara ) 08/20/2018   Vitamin D  deficiency 02/08/2017   Past Surgical History:  Procedure Laterality Date   CESAREAN SECTION     CHOLECYSTECTOMY     HERNIA REPAIR     LAPAROSCOPIC GASTRIC SLEEVE RESECTION WITH HIATAL HERNIA REPAIR N/A 01/25/2018   Procedure: LAPAROSCOPIC GASTRIC SLEEVE RESECTION WITH HIATAL HERNIA REPAIR, UPPER ENDO AND ERAS PATHWAY;  Surgeon: Tanda Locus, MD;  Location: [facility];  Service: General;  Laterality: N/A;   PORTACATH PLACEMENT     TONSILLECTOMY     TOTAL ABDOMINAL HYSTERECTOMY W/ BILATERAL SALPINGOOPHORECTOMY     Patient Active Problem List   Diagnosis Date Noted   Dehydration 12/09/2022   Obesity  (BMI 30-39.9) 05/06/2019   Hirsutism 05/06/2019   Osteopenia 11/01/2018   Use of letrozole  (Femara ) 08/20/2018   S/P laparoscopic sleeve gastrectomy, 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 Tanda 03/29/2018   SUI (stress urinary incontinence, female) 01/25/2018   Vitamin D  deficiency 02/08/2017   Mixed hyperlipidemia 02/07/2017   Dysthymia 02/07/2017   History of breast cancer, Dx 2015, ER +, HER2 -, grade 3, s/p adriamycin and radiation 02/07/2017    PCP:   REFERRING PROVIDER: Jaclyn Marie White, NP  REFERRING DIAG: s/p Bilateral Mastectomies   THERAPY DIAG:  Personal history of malignant neoplasm of breast  History of bilateral mastectomy  Stiffness of left shoulder, not elsewhere classified  Axillary web syndrome  ONSET DATE: 09/22/2022  Rationale for Evaluation and Treatment: Rehabilitation  SUBJECTIVE:  SUBJECTIVE STATEMENT:   I have been inconsistent with exercises and I am a little discouraged. Its been a little more painful the last few days. I need to get back on schedule again.   PERTINENT HISTORY:  Pt is s/p left lumpectomy with SLNB( 1 or 2) 03/01/2014. She had neoadjuvant chemotherapy at that time and radiation and was on Letrozole  for 5 years  She was diagnosed with Right  triple negative breast cancer in June 2024 and is s/p bilateral Mastectomies with Right SLNB and 0+/3 sentinel LN's on 09/02/2022. She underwent adjuvant chemotherapy and is scheduled for a bone scan in Dec. 2025. She is back on letrozole .    PAIN:  Are you having pain? , just a little stiff NPRS scale: 3-4/10 with stretching, subtle throb at rest Pain location: left axillary region Pain orientation: Left  PAIN TYPE: throbbing and tight Pain description: intermittent  Aggravating factors: raising arm, barre,  sometimes at work carrying samples Relieving factors: not reaching.  PRECAUTIONS: bilateral UE lymphedema risk, Right Clavicle ORIF, hypertension, gastric sleeve, osteopenia  RED FLAGS: None   WEIGHT BEARING RESTRICTIONS: No  FALLS:  Has patient fallen in last 6 months? No  LIVING ENVIRONMENT: Lives with: lives with their family, husband and 70 yr old twin sons Lives in: House/apartment    OCCUPATION: Manufacturing Systems Engineer for [employer]    LEISURE: decorate, travel, barre, spend time with family and friends  HAND DOMINANCE: left   PRIOR LEVEL OF FUNCTION: Independent  PATIENT GOALS: Decrease pain, improve ROM   OBJECTIVE: Note: Objective measures were completed at Evaluation unless otherwise noted.  COGNITION: Overall cognitive status: Within functional limits for tasks assessed   PALPATION: Multiple thin cords noted in axilla with some travelling into forearm, Tight left pectorals  OBSERVATIONS / OTHER ASSESSMENTS: no pitting edema or observable/measureable signs of lymphedema  SENSATION: Light touch: Deficits     POSTURE: forward head, rounded shoulders  UPPER EXTREMITY AROM/PROM:  A/PROM RIGHT   eval   Shoulder extension 54  Shoulder flexion 152  Shoulder abduction 180  Shoulder internal rotation 77  Shoulder external rotation 95    (Blank rows = not tested)  A/PROM LEFT   eval LEFT 03/02/2023  Shoulder extension 55   Shoulder flexion 142 142  Shoulder abduction 154 162  Shoulder internal rotation 54   Shoulder external rotation 88     (Blank rows = not tested)  CERVICAL AROM: All within normal limits:     UPPER EXTREMITY STRENGTH:   LYMPHEDEMA ASSESSMENTS:   SURGERY TYPE/DATE: left lumpectomy with SLNB 03/01/2014. s/p bilateral Mastectomies with Right SLNB and 0+/3 LN's, 09/02/2022  NUMBER OF LYMPH NODES REMOVED: 0+/3 Left  CHEMOTHERAPY: yes neoadjuvant 2015, Adjuvant 2024  RADIATION:Yes 2016  HORMONE TREATMENT: Letrozole  x 5  years    LYMPHEDEMA ASSESSMENTS:   LANDMARK RIGHT  eval  At axilla  34.2  15 cm proximal to the proximal aspect of the olecranon process 32.2  10 cm proximal to the proximal aspect of the olecranon process 29  Olecranon process 26.5  15 cm proximal to the proximal aspect of the ulnar styloid process 24.6  10 cm proximal to the proximal aspect of the ulnar styloid process 22.0  Just distal to the ulnar styloid process 16.3  Across hand at thumb web space 19.5  At base of 2nd digit 6.4  (Blank rows = not tested)  LANDMARK LEFT  eval  At axilla  33.9  15 cm proximal to the proximal aspect of  the olecranon process 33.0  10 cm proximal to the proximal aspect of the olecranon process 29.9  Olecranon process 27.0  15 cm proximal to the proximal aspect of the ulnar styloid process 24.2  10 cm proximal to  the proximal aspect of the ulnar styloid process 21.9  Just distal to the ulnar styloid process 16.1  Across hand at thumb web space 19.2  At base of 2nd digit 6.6  (Blank rows = not tested)  Chest circumference just inferior to the axillae:  Chest circumference at the largest point:       QUICK DASH SURVEY: 25%                                                                                                                            TREATMENT DATE:   03/31/2024 Ball rolls on wall forward x 10 Supine horizontal abd yellow x 5 STM to bilateral UT, left pectorals and lateral trunk MFR to cording left UE axilla to forearm, and dynamic cupping with cocoa butter to left UE cording in proximal direction PROM left shoulder flex, scaption, abduction  03/07/2024 Ball rolls on wall x 5 flexion, x 4 abd (held due to pain in shoulder) Supine AROM Flexion x 3, scaption x 3, horizontal abd x 3, snow angels x 3 abd each x 5, snow angels x7 Supine horizontal abd with yellow x 5 band Standing scapular retraction and shoulder extension with yellow, x 5 ea STM with cocoa butter to bilateral  UT/posterior cervicals, Left pectorals, lateral trunk in supine. Cording release to left UE axilla to forearm PROM Left shoulder flex, scaption, abd, ER with multiple VC's to relax   02/29/2024 STM with cocoa butter to bilateral UT/posterior cervicals, Left pectorals, lateral trunk in supine and left scapular region in Right SL. Supine wand flex and scaption x 5 with multiple VC's to relax, and scapular depression prn to prevent shoulder pain Star gazer x 5 Discussed meditation, importance of letting her arm relax during exercises Recert next visit  02/24/2024 Tried overhead pulleys but pt didn't relax well and got very tired after 1 min so discontinued. Discussed scapular compensation and importance of not letting shoulders ride up which produces shoulder pain. STM with cocoa butter to bilateral UT/posterior cervicals, Left pectorals, lateral trunki n supineand left scapular region in Right SL. Supine wand flex and scaption with multiple VC's to relax Star gazer x 5 Lower trunk rotation with arms outstretched x 3 ea Pectoral corner stretch and single arm doorway stretch x 2-3 ea Showed pt posterior shoulder rolls and scapular retraction for relaxation/posture PROM Left shoulder Flexion, scaption, abduction, ER with multiple VC's to relax Updated HEP 01/19/2024 Educated pt in axillary cording. Demonstrated release techniques on pt and showed pt gentle stretches to release cords. Supine wand scaption x 5, stargazer supine, wall slide for abd  being careful not to let UT compensate. Exercises to be done 5 reps 2 times per day. Also showed  wrist ext stretches in varying degrees of shoulder extension/scaption to gentle stretch cords throughout the day. Advised OK to do legs/ abs at barre class, but avoid left UE. Pt has a compression sleeve. Advised OK to wear for cording , but use caution that hand is not swelling. Advised to bring in sleeve so we can see.     PATIENT EDUCATION:  Access  Code: [email or web address] URL: [email or web address] Date: 02/24/2024 Prepared by: Grayce Sheldon  Exercises - Supine Shoulder Flexion Extension AAROM with Dowel  - 2 x daily - 7 x weekly - 1 sets - 5 reps - 5-10 hold - Corner Pec Major Stretch  - 1 x daily - 7 x weekly - 1 sets - 3 reps - 20 hold - Single Arm Doorway Pec Stretch at 90 Degrees Abduction  - 2 x daily - 7 x weekly - 1 sets - 3 reps - 20 hold - Supine Lower Trunk Rotation  - 1 x daily - 7 x weekly - 1 sets - 3 reps - 10-20 hold    01/19/2024 Education details: Educated pt in axillary cording. Demonstrated release techniques on pt and showed pt gentle stretches to release cords. Supine wand scaption x 5, stargazer supine, wall slide for abd  abeing careful not to let UT compensate. Exercises to be done 5 reps 2 times per day. Also showed wrist ext stretches in varying degrees of shoulder extension/scaption to gentle stretch cords throughout the day. Advised OK to do legs/ abs at barre class, but avoid left UE. Pt has a compression sleeve. Advised OK to wear for cording , but use caution that hand is not swelling. Advised to bring in sleeve so we can see.  Person educated: Patient Education method: Explanation, Demonstration, and Handouts Education comprehension: verbalized understanding and returned demonstration  HOME EXERCISE PROGRAM: Supine wand scaption, supine stargazer, standing wall slide abduction, wrist ext in various positions of shoulder to stretch cords  ASSESSMENT:  CLINICAL IMPRESSION:  Pt has missed more appts due to sickness and bad weather. She is anxious to get started back to therapy again. She continues with tightness in the left pectorals and limitations from cording. She still has difficulty relaxing at times.  EVAL 01/19/2024 Patient is a 54 y.o. female who was seen today for physical therapy evaluation and treatment s/p bilateral Mastectomies with adjacent tissue transfer to close on 09/22/2022 .  She was seen for follow up by MD with  noted decrease in left shoulder ROM and cording. She does demonstrate limitations in left shoulder ROM and multiple cords are palpated in the left axilla and running into the forearm. There are no concerns for lymphedema today. She was instructed in some gentle stretches to address cording, and my wear her compression sleeve as comfortable to assist with cording. She will benefit from skilled PT to address deficits and return to PLOF.   OBJECTIVE IMPAIRMENTS: decreased knowledge of condition, decreased ROM, impaired flexibility, impaired UE functional use, postural dysfunction, and pain.   ACTIVITY LIMITATIONS: reach over head  PARTICIPATION LIMITATIONS: able to take part in all but full Barre class, restricted with activities that include reaching  PERSONAL FACTORS: None are also affecting patient's functional outcome.   REHAB POTENTIAL: Excellent  CLINICAL DECISION MAKING: Stable/uncomplicated  EVALUATION COMPLEXITY: Low  GOALS: Goals reviewed with patient? Yes  SHORT TERM GOALS=LONG TERM GOALS: Target date: 03/01/2024  Pt will be independent with a HEP to improve shoulder ROM/strength Baseline: Goal status: INITIAL  2.  Pt will have  left shoulder AROM flexion and abd within 5-8 degrees of right shoulder Baseline: Right 152, 180 Goal status: INITIAL  3.  Pts quick dash will improve to no greater than 12% to demonstrate improved function Baseline:  Goal status: INITIAL  4.  Pt will be able to resume left UE strengthening without aggravation of cording Baseline:  Goal status: INITIAL   PLAN:  PT FREQUENCY: 1-2x/week  PT DURATION: 6 weeks  PLANNED INTERVENTIONS: 97164- PT Re-evaluation, 97110-Therapeutic exercises, 97530- Therapeutic activity, [id number]- Neuromuscular re-education, 97535- Self Care, 02859- Manual therapy, 97760- Orthotic Initial, [id number]- Orthotic/Prosthetic subsequent, and Joint mobilization  PLAN FOR NEXT SESSION:.review  HEP and progress, STM, cording release, cupping prn, MLD prn,  Grayce JINNY Sheldon, PT 03/31/2024, 9:00 AM  "

## 2024-04-07 ENCOUNTER — Ambulatory Visit

## 2024-04-12 ENCOUNTER — Ambulatory Visit

## 2024-04-21 ENCOUNTER — Ambulatory Visit

## 2024-04-26 ENCOUNTER — Ambulatory Visit
